# Patient Record
Sex: Male | Born: 1955 | Race: White | Hispanic: No | Marital: Married | Smoking: Current every day smoker
Health system: Southern US, Community
[De-identification: ages and names within clinical notes are randomized; demographics above are authoritative.]

## PROBLEM LIST (undated history)

## (undated) DIAGNOSIS — T8859XA Other complications of anesthesia, initial encounter: Secondary | ICD-10-CM

## (undated) DIAGNOSIS — F419 Anxiety disorder, unspecified: Secondary | ICD-10-CM

## (undated) DIAGNOSIS — M1990 Unspecified osteoarthritis, unspecified site: Secondary | ICD-10-CM

## (undated) DIAGNOSIS — I10 Essential (primary) hypertension: Secondary | ICD-10-CM

## (undated) DIAGNOSIS — I2510 Atherosclerotic heart disease of native coronary artery without angina pectoris: Secondary | ICD-10-CM

## (undated) DIAGNOSIS — I219 Acute myocardial infarction, unspecified: Secondary | ICD-10-CM

## (undated) DIAGNOSIS — Z951 Presence of aortocoronary bypass graft: Secondary | ICD-10-CM

## (undated) DIAGNOSIS — T4145XA Adverse effect of unspecified anesthetic, initial encounter: Secondary | ICD-10-CM

## (undated) DIAGNOSIS — E785 Hyperlipidemia, unspecified: Secondary | ICD-10-CM

## (undated) HISTORY — DX: Hyperlipidemia, unspecified: E78.5

## (undated) HISTORY — DX: Essential (primary) hypertension: I10

---

## 1999-01-03 ENCOUNTER — Emergency Department (HOSPITAL_COMMUNITY): Admission: EM | Admit: 1999-01-03 | Discharge: 1999-01-03 | Payer: Self-pay

## 2009-12-23 ENCOUNTER — Emergency Department (HOSPITAL_COMMUNITY): Admission: EM | Admit: 2009-12-23 | Discharge: 2009-12-23 | Payer: Self-pay | Admitting: Emergency Medicine

## 2010-01-01 ENCOUNTER — Emergency Department (HOSPITAL_COMMUNITY): Admission: EM | Admit: 2010-01-01 | Discharge: 2010-01-01 | Payer: Self-pay | Admitting: Family Medicine

## 2012-12-31 ENCOUNTER — Inpatient Hospital Stay (HOSPITAL_COMMUNITY)
Admission: EM | Admit: 2012-12-31 | Discharge: 2013-01-08 | DRG: 234 | Disposition: A | Discharge status: Home / Self Care | Payer: Medicaid Other | Attending: Thoracic Surgery (Cardiothoracic Vascular Surgery) | Admitting: Thoracic Surgery (Cardiothoracic Vascular Surgery)

## 2012-12-31 ENCOUNTER — Emergency Department (HOSPITAL_COMMUNITY): Payer: Medicaid Other

## 2012-12-31 ENCOUNTER — Encounter (HOSPITAL_COMMUNITY): Payer: Self-pay | Admitting: Cardiology

## 2012-12-31 DIAGNOSIS — I219 Acute myocardial infarction, unspecified: Secondary | ICD-10-CM

## 2012-12-31 DIAGNOSIS — J988 Other specified respiratory disorders: Secondary | ICD-10-CM | POA: Diagnosis not present

## 2012-12-31 DIAGNOSIS — R05 Cough: Secondary | ICD-10-CM | POA: Diagnosis present

## 2012-12-31 DIAGNOSIS — E7800 Pure hypercholesterolemia, unspecified: Secondary | ICD-10-CM | POA: Diagnosis present

## 2012-12-31 DIAGNOSIS — Z23 Encounter for immunization: Secondary | ICD-10-CM

## 2012-12-31 DIAGNOSIS — I498 Other specified cardiac arrhythmias: Secondary | ICD-10-CM | POA: Diagnosis present

## 2012-12-31 DIAGNOSIS — E8779 Other fluid overload: Secondary | ICD-10-CM | POA: Diagnosis not present

## 2012-12-31 DIAGNOSIS — R079 Chest pain, unspecified: Secondary | ICD-10-CM

## 2012-12-31 DIAGNOSIS — Y832 Surgical operation with anastomosis, bypass or graft as the cause of abnormal reaction of the patient, or of later complication, without mention of misadventure at the time of the procedure: Secondary | ICD-10-CM | POA: Diagnosis not present

## 2012-12-31 DIAGNOSIS — I214 Non-ST elevation (NSTEMI) myocardial infarction: Secondary | ICD-10-CM | POA: Diagnosis present

## 2012-12-31 DIAGNOSIS — F17200 Nicotine dependence, unspecified, uncomplicated: Secondary | ICD-10-CM | POA: Diagnosis present

## 2012-12-31 DIAGNOSIS — J9819 Other pulmonary collapse: Secondary | ICD-10-CM | POA: Diagnosis not present

## 2012-12-31 DIAGNOSIS — R059 Cough, unspecified: Secondary | ICD-10-CM | POA: Diagnosis present

## 2012-12-31 DIAGNOSIS — F411 Generalized anxiety disorder: Secondary | ICD-10-CM | POA: Diagnosis present

## 2012-12-31 DIAGNOSIS — Z951 Presence of aortocoronary bypass graft: Secondary | ICD-10-CM

## 2012-12-31 DIAGNOSIS — I2510 Atherosclerotic heart disease of native coronary artery without angina pectoris: Secondary | ICD-10-CM

## 2012-12-31 HISTORY — DX: Acute myocardial infarction, unspecified: I21.9

## 2012-12-31 HISTORY — DX: Presence of aortocoronary bypass graft: Z95.1

## 2012-12-31 HISTORY — PX: CARDIAC CATHETERIZATION: SHX172

## 2012-12-31 LAB — TROPONIN I: Troponin I: 1.54 ng/mL (ref <0.30)

## 2012-12-31 LAB — CBC
MCH: 33.8 pg (ref 26.0–34.0)
MCHC: 35.2 g/dL (ref 30.0–36.0)
MCV: 96 fL (ref 78.0–100.0)
Platelets: 251 10*3/uL (ref 150–400)
RBC: 5.21 MIL/uL (ref 4.22–5.81)

## 2012-12-31 LAB — BASIC METABOLIC PANEL
CO2: 24 mEq/L (ref 19–32)
Calcium: 9.6 mg/dL (ref 8.4–10.5)
Creatinine, Ser: 0.74 mg/dL (ref 0.50–1.35)
GFR calc non Af Amer: >90 mL/min (ref >90)
Sodium: 139 mEq/L (ref 135–145)

## 2012-12-31 LAB — POCT I-STAT TROPONIN I: Troponin i, poc: 0.33 ng/mL (ref 0.00–0.08)

## 2012-12-31 MED ORDER — DIAZEPAM 5 MG PO TABS: 5.0000 mg | ORAL_TABLET | ORAL | Status: DC

## 2012-12-31 MED ORDER — ASPIRIN 81 MG PO CHEW: 324.0000 mg | CHEWABLE_TABLET | ORAL | Status: DC

## 2012-12-31 MED ORDER — ZOLPIDEM TARTRATE 5 MG PO TABS
10.0000 mg | ORAL_TABLET | Freq: Every evening | ORAL | Status: DC | PRN
  Filled 2012-12-31: qty 2

## 2012-12-31 MED ORDER — DIAZEPAM 5 MG PO TABS
5.0000 mg | ORAL_TABLET | Freq: Once | ORAL | Status: AC
  Administered 2013-01-01: 5 mg via ORAL
  Filled 2012-12-31: qty 1

## 2012-12-31 MED ORDER — HEPARIN BOLUS VIA INFUSION
4000.0000 [IU] | Freq: Once | INTRAVENOUS | Status: AC
  Administered 2012-12-31: 4000 [IU] via INTRAVENOUS

## 2012-12-31 MED ORDER — ONDANSETRON HCL 4 MG/2ML IJ SOLN: 4.0000 mg | Freq: Four times a day (QID) | INTRAMUSCULAR | Status: DC | PRN

## 2012-12-31 MED ORDER — ASPIRIN 81 MG PO CHEW
324.0000 mg | CHEWABLE_TABLET | Freq: Once | ORAL | Status: AC
  Administered 2012-12-31: 324 mg via ORAL
  Filled 2012-12-31: qty 4

## 2012-12-31 MED ORDER — NITROGLYCERIN IN D5W 200-5 MCG/ML-% IV SOLN
5.0000 ug/min | INTRAVENOUS | Status: DC
  Administered 2012-12-31 – 2013-01-01 (×2): 5 ug/min via INTRAVENOUS
  Filled 2012-12-31: qty 250

## 2012-12-31 MED ORDER — ASPIRIN EC 81 MG PO TBEC
81.0000 mg | DELAYED_RELEASE_TABLET | Freq: Every day | ORAL | Status: DC
  Administered 2013-01-01 – 2013-01-02 (×2): 81 mg via ORAL
  Filled 2012-12-31 (×4): qty 1

## 2012-12-31 MED ORDER — ALPRAZOLAM 0.25 MG PO TABS
1.0000 mg | ORAL_TABLET | Freq: Three times a day (TID) | ORAL | Status: DC | PRN
  Administered 2012-12-31 – 2013-01-02 (×3): 1 mg via ORAL
  Filled 2012-12-31: qty 4
  Filled 2012-12-31: qty 2
  Filled 2012-12-31: qty 1
  Filled 2012-12-31: qty 3
  Filled 2012-12-31: qty 4

## 2012-12-31 MED ORDER — NITROGLYCERIN 0.4 MG SL SUBL: 0.4000 mg | SUBLINGUAL_TABLET | SUBLINGUAL | Status: DC | PRN

## 2012-12-31 MED ORDER — METOPROLOL TARTRATE 25 MG PO TABS
25.0000 mg | ORAL_TABLET | Freq: Two times a day (BID) | ORAL | Status: DC
  Administered 2012-12-31 – 2013-01-02 (×5): 25 mg via ORAL
  Filled 2012-12-31 (×8): qty 1

## 2012-12-31 MED ORDER — ASPIRIN 300 MG RE SUPP
300.0000 mg | RECTAL | Status: DC
  Filled 2012-12-31: qty 1

## 2012-12-31 MED ORDER — SODIUM CHLORIDE 0.9 % IV SOLN
INTRAVENOUS | Status: DC
  Administered 2012-12-31: 23:00:00 via INTRAVENOUS

## 2012-12-31 MED ORDER — SIMVASTATIN 20 MG PO TABS
20.0000 mg | ORAL_TABLET | Freq: Every day | ORAL | Status: DC
  Administered 2013-01-01 – 2013-01-04 (×3): 20 mg via ORAL
  Filled 2012-12-31 (×6): qty 1

## 2012-12-31 MED ORDER — PANTOPRAZOLE SODIUM 40 MG PO TBEC
40.0000 mg | DELAYED_RELEASE_TABLET | Freq: Every day | ORAL | Status: DC
  Administered 2012-12-31 – 2013-01-03 (×3): 40 mg via ORAL
  Filled 2012-12-31 (×4): qty 1

## 2012-12-31 MED ORDER — INFLUENZA VIRUS VACC SPLIT PF IM SUSP
0.5000 mL | INTRAMUSCULAR | Status: AC
  Administered 2013-01-02: 0.5 mL via INTRAMUSCULAR
  Filled 2012-12-31: qty 0.5

## 2012-12-31 MED ORDER — ACETAMINOPHEN 325 MG PO TABS: 650.0000 mg | ORAL_TABLET | ORAL | Status: DC | PRN

## 2012-12-31 MED ORDER — ALPRAZOLAM 0.5 MG PO TABS
ORAL_TABLET | ORAL | Status: AC
  Administered 2012-12-31: 0.5 mg
  Filled 2012-12-31: qty 1

## 2012-12-31 MED ORDER — HEPARIN (PORCINE) IN NACL 100-0.45 UNIT/ML-% IJ SOLN
1800.0000 [IU]/h | INTRAMUSCULAR | Status: DC
  Administered 2012-12-31: 1450 [IU]/h via INTRAVENOUS
  Filled 2012-12-31 (×2): qty 250

## 2012-12-31 NOTE — ED Notes (Signed)
Pt reports about a week ago he had an episode of midsternal chest pain and has had two episodes since then. Reports his chest feels tight, and some SOB. Denies any n/v. Also reports increased BP over past couple of days.

## 2012-12-31 NOTE — H&P (Signed)
Patient ID: Juan Gates MRN: [id number], DOB/AGE: October 07, 1956   Admit date: 12/31/2012   Primary Physician: No primary provider on file. Primary Cardiologist: Dr Tresa Endo- new  HPI: 57 y/o with no prior history of CAD or serious medical issues, presents to the ER today with complaints of SSCP described as "tightness". He admits to similar episodes this past week that lasted only seconds. This am his symptoms persisted and he had radiation down his arms. In the ER his POC .33.    Problem List: History reviewed. No pertinent past medical history.  History reviewed. No pertinent past surgical history.   Allergies: No Known Allergies   Home Medications NONE   Family History  Problem Relation Age of Onset  . Hypertension Mother   . Cancer Father     bone cancer  . Cancer Brother     metastatic melanoma     History   Social History  . Marital Status: Divorced    Spouse Name: N/A    Number of Children: N/A  . Years of Education: N/A   Occupational History  . Not on file.   Social History Main Topics  . Smoking status: Current Every Day Smoker -- 1.5 packs/day    Types: Cigarettes  . Smokeless tobacco: Not on file  . Alcohol Use: Yes  . Drug Use: No  . Sexually Active: Not on file   Other Topics Concern  . Not on file   Social History Narrative  . No narrative on file     Review of Systems: General: negative for chills, fever, night sweats or weight changes.  Cardiovascular: negative for chest pain, dyspnea on exertion, edema, orthopnea, palpitations, paroxysmal nocturnal dyspnea or shortness of breath Dermatological: negative for rash Respiratory: negative for cough or wheezing Urologic: negative for hematuria Abdominal: negative for nausea, vomiting, diarrhea, bright red blood per rectum, melena, or hematemesis Neurologic: negative for visual changes, syncope, or dizziness All other systems reviewed and are otherwise negative except as noted  above.  Physical Exam: Blood pressure 135/92, pulse 87, temperature 97.7 F (36.5 C), temperature source Oral, resp. rate 13, SpO2 94.00%.  General appearance: alert, cooperative and no distress Neck: no carotid bruit and no JVD Lungs: clear to auscultation bilaterally Heart: regular rate and rhythm, S1, S2 normal, no murmur, click, rub or gallop Abdomen: soft, non-tender; bowel sounds normal; no masses,  no organomegaly Extremities: extremities normal, atraumatic, no cyanosis or edema Pulses: 2+ and symmetric Skin: Skin color, texture, turgor normal. No rashes or lesions Neurologic: Grossly normal    Labs:   Results for orders placed during the hospital encounter of 12/31/12 (from the past 24 hour(s))  CBC     Status: Abnormal   Collection Time   12/31/12  1:53 PM      Component Value Range   WBC 9.9  4.0 - 10.5 K/uL   RBC 5.21  4.22 - 5.81 MIL/uL   Hemoglobin 17.6 (*) 13.0 - 17.0 g/dL   HCT 13.0  86.5 - 78.4 %   MCV 96.0  78.0 - 100.0 fL   MCH 33.8  26.0 - 34.0 pg   MCHC 35.2  30.0 - 36.0 g/dL   RDW 69.6  29.5 - 28.4 %   Platelets 251  150 - 400 K/uL  BASIC METABOLIC PANEL     Status: Abnormal   Collection Time   12/31/12  1:53 PM      Component Value Range   Sodium 139  135 - 145 mEq/L  Potassium 3.5  3.5 - 5.1 mEq/L   Chloride 101  96 - 112 mEq/L   CO2 24  19 - 32 mEq/L   Glucose, Bld 125 (*) 70 - 99 mg/dL   BUN 11  6 - 23 mg/dL   Creatinine, Ser 2.13  0.50 - 1.35 mg/dL   Calcium 9.6  8.4 - 08.6 mg/dL   GFR calc non Af Amer >90  >90 mL/min   GFR calc Af Amer >90  >90 mL/min  POCT I-STAT TROPONIN I     Status: Abnormal   Collection Time   12/31/12  2:02 PM      Component Value Range   Troponin i, poc 0.33 (*) 0.00 - 0.08 ng/mL   Comment NOTIFIED PHYSICIAN     Comment 3              Radiology/Studies: Dg Chest 2 View  12/31/2012  *RADIOLOGY REPORT*  Clinical Data: Chest pain  CHEST - 2 VIEW  Comparison: None  Findings: Cardiomediastinal silhouette is unremarkable.   No acute infiltrate or pleural effusion.  No pulmonary edema.  Mild degenerative changes mid thoracic spine.  IMPRESSION: No active disease.  Mild degenerative changes mid thoracic spine.   Original Report Authenticated By: Natasha Mead, M.D.     EKG:NSR, NSST changes  ASSESSMENT AND PLAN:  Active Problems:  NSTEMI (non-ST elevated myocardial infarction)  Smoker  Plan- Admit, Heparin, ASA, statin, beta blocker, NTG, cycle Troponin, cath in AM.  Signed, Juan Derrick, PA-C 12/31/2012, 4:48 PM   Patient seen and examined. Agree with assessment and plan. 35 yr old WM with a 30 year h/o tobacco use presents today with recurrent episodes of chest tightness worrisome for angina. Today's episode was non-exertional but associated with arm radiation. He denies any wheezing. Pain resolved spontaneously but due to longer duration of discomfort he presented to ER for evaluation.  ECG shows mild nondiagnostic ST changes inferolaterally and initial POC marker is mildly positive for troponin.  Will treat as possible USAP, and will heparinize, initiate beta-blocker, nitrates and cycle enzymes. With risk factor profile will most likely need definitive cardiac catheterization. Will tentatively set up for in am.   Juan Bihari, MD, [facility] 12/31/2012 5:06 PM

## 2012-12-31 NOTE — Progress Notes (Signed)
ANTICOAGULATION CONSULT NOTE - Initial Consult  Pharmacy Consult for Heparin Indication: chest pain/ACS  No Known Allergies  Patient Measurements: Height: 6' 1.5" (186.7 cm) Weight: 230 lb (104.327 kg) IBW/kg (Calculated) : 81.05  Heparin Dosing Weight: 102 Kg  Vital Signs: Temp: 97.7 F (36.5 C) (01/06 1343) Temp src: Oral (01/06 1343) BP: 135/92 mmHg (01/06 1545) Pulse Rate: 87  (01/06 1545)  Labs:  Basename 12/31/12 1353  HGB 17.6*  HCT 50.0  PLT 251  APTT --  LABPROT --  INR --  HEPARINUNFRC --  CREATININE 0.74  CKTOTAL --  CKMB --  TROPONINI --    CrCl is unknown because there is no height on file for the current visit.  Medical History: History reviewed. No pertinent past medical history.  Medications:  PTA meds: none  Assessment: Juan Gates is a 57 yo without significant PMH who is admitted with chest pain. Noted his initial troponin is elevated at 0.33, other labs are nml at baseline. Noted cath planned in AM.  Goal of Therapy:  Heparin level 0.3-0.7 units/ml Monitor platelets by anticoagulation protocol: Yes   Plan:  Give 4000 units bolus x 1 Start heparin infusion at 1450 units/hr Check anti-Xa level in 6 hours and daily while on heparin Continue to monitor H&H and platelets  Thanks, Erionna Strum K. Allena Katz, PharmD, BCPS.  Clinical Pharmacist Pager [phone]. 12/31/2012 5:59 PM

## 2012-12-31 NOTE — ED Provider Notes (Signed)
History     CSN: [id number]  Arrival date & time 12/31/12  1331   First MD Initiated Contact with Patient 12/31/12 1512      Chief Complaint  Patient presents with  . Chest Pain    HPI Pt was seen at 1520.   Per pt, c/o gradual onset and worsening of multiple intermittent episodes of chest "pain" for the past week.  Describes the CP as "heaviness" and "tightness."  States the chest discomfort began while he was walking in the woods to [place], lasted approx "10 seconds" before spontaneously resolving.  States since then, the CP has been intermittent for the past week, occurring at rest or during exertion, lasting for longer and longer periods of time.  Endorses today's episode was "much worse than the other times."  Describes today's episode of CP as "more intense," assoc with SOB and radiation down his arms, which resolved after 10 minutes.  Pt has not taken his daily ASA yet today.  Denies N/V/D, no back pain, no palpitations, no abd pain.     History reviewed. No pertinent past medical history.  History reviewed. No pertinent past surgical history.  Family History  Problem Relation Age of Onset  . Hypertension Mother   . Cancer Father     bone cancer  . Cancer Brother     metastatic melanoma    History  Substance Use Topics  . Smoking status: Current Every Day Smoker  . Smokeless tobacco: Not on file  . Alcohol Use: Yes    Review of Systems ROS: Statement: All systems negative except as marked or noted in the HPI; Constitutional: Negative for fever and chills. ; ; Eyes: Negative for eye pain, redness and discharge. ; ; ENMT: Negative for ear pain, hoarseness, nasal congestion, sinus pressure and sore throat. ; ; Cardiovascular: CP, SOB. Negative for palpitations, diaphoresis, and peripheral edema. ; ; Respiratory: Negative for cough, wheezing and stridor. ; ; Gastrointestinal: Negative for nausea, vomiting, diarrhea, abdominal pain, blood in stool, hematemesis, jaundice  and rectal bleeding. . ; ; Genitourinary: Negative for dysuria, flank pain and hematuria. ; ; Musculoskeletal: Negative for back pain and neck pain. Negative for swelling and trauma.; ; Skin: Negative for pruritus, rash, abrasions, blisters, bruising and skin lesion.; ; Neuro: Negative for headache, lightheadedness and neck stiffness. Negative for weakness, altered level of consciousness , altered mental status, extremity weakness, paresthesias, involuntary movement, seizure and syncope.       Allergies  Review of patient's allergies indicates no known allergies.  Home Medications   Current Outpatient Rx  Name  Route  Sig  Dispense  Refill  . DIPHENHYDRAMINE HCL 25 MG PO CAPS   Oral   Take 25-50 mg by mouth at bedtime.           BP 135/92  Pulse 87  Temp 97.7 F (36.5 C) (Oral)  Resp 13  SpO2 94%  Physical Exam 1525: Physical examination:  Nursing notes reviewed; Vital signs and O2 SAT reviewed;  Constitutional: Well developed, Well nourished, Well hydrated, In no acute distress; Head:  Normocephalic, atraumatic; Eyes: EOMI, PERRL, No scleral icterus; ENMT: Mouth and pharynx normal, Mucous membranes moist; Neck: Supple, Full range of motion, No lymphadenopathy; Cardiovascular: Regular rate and rhythm, No murmur, rub, or gallop; Respiratory: Breath sounds clear & equal bilaterally, No rales, rhonchi, wheezes.  Speaking full sentences with ease, Normal respiratory effort/excursion; Chest: Nontender, Movement normal; Abdomen: Soft, Nontender, Nondistended, Normal bowel sounds; Genitourinary: No CVA tenderness;  Extremities: Pulses normal, No tenderness, No edema, No calf edema or asymmetry.; Neuro: AA&Ox3, Major CN grossly intact.  Speech clear. No gross focal motor or sensory deficits in extremities.; Skin: Color normal, Warm, Dry.   ED Course  Procedures    MDM  MDM Reviewed: nursing note and vitals Interpretation: labs, ECG and x-ray    Date: 12/31/2012 on arrival   Rate:  111  Rhythm: sinus tachycardia  QRS Axis: left  Intervals: normal  ST/T Wave abnormalities: nonspecific ST/T changes, diffuse ST depressions  Conduction Disutrbances:none  Narrative Interpretation:   Old EKG Reviewed: none available    Date: 12/31/2012 repeat  Rate: 96  Rhythm: normal sinus rhythm  QRS Axis: left  Intervals: normal  ST/T Wave abnormalities: nonspecific ST/T changes, diffuse ST depressions  Conduction Disutrbances:none  Narrative Interpretation:   Old EKG Reviewed: unchanged from previous EKG today.  Results for orders placed during the hospital encounter of 12/31/12  CBC      Component Value Range   WBC 9.9  4.0 - 10.5 K/uL   RBC 5.21  4.22 - 5.81 MIL/uL   Hemoglobin 17.6 (*) 13.0 - 17.0 g/dL   HCT 19.1  47.8 - 29.5 %   MCV 96.0  78.0 - 100.0 fL   MCH 33.8  26.0 - 34.0 pg   MCHC 35.2  30.0 - 36.0 g/dL   RDW 62.1  30.8 - 65.7 %   Platelets 251  150 - 400 K/uL  BASIC METABOLIC PANEL      Component Value Range   Sodium 139  135 - 145 mEq/L   Potassium 3.5  3.5 - 5.1 mEq/L   Chloride 101  96 - 112 mEq/L   CO2 24  19 - 32 mEq/L   Glucose, Bld 125 (*) 70 - 99 mg/dL   BUN 11  6 - 23 mg/dL   Creatinine, Ser 8.46  0.50 - 1.35 mg/dL   Calcium 9.6  8.4 - 96.2 mg/dL   GFR calc non Af Amer >90  >90 mL/min   GFR calc Af Amer >90  >90 mL/min  POCT I-STAT TROPONIN I      Component Value Range   Troponin i, poc 0.33 (*) 0.00 - 0.08 ng/mL   Comment NOTIFIED PHYSICIAN     Comment 3             Dg Chest 2 View 12/31/2012  *RADIOLOGY REPORT*  Clinical Data: Chest pain  CHEST - 2 VIEW  Comparison: None  Findings: Cardiomediastinal silhouette is unremarkable.  No acute infiltrate or pleural effusion.  No pulmonary edema.  Mild degenerative changes mid thoracic spine.  IMPRESSION: No active disease.  Mild degenerative changes mid thoracic spine.   Original Report Authenticated By: Natasha Mead, M.D.      1550:  Denies CP currently. Troponin minimally elevated and EKG with  diffuse ST depressions; no ST elevations or progression of ST changes while in the ED.  Concern for unstable angina, tx ASA and heparin.  Dx and testing d/w pt.  Questions answered.  Verb understanding, agreeable to admit.  T/C to [facility], case discussed, including:  HPI, pertinent PM/SHx, VS/PE, dx testing, ED course and treatment:  Agreeable to admit, requests they will come to ED for eval.    Laray Anger, DO 01/02/13 1034

## 2012-12-31 NOTE — ED Notes (Signed)
Results of troponin given to attending EMT Darrel to give to MD

## 2013-01-01 ENCOUNTER — Encounter (HOSPITAL_COMMUNITY)
Admission: EM | Disposition: A | Discharge status: Home / Self Care | Payer: Self-pay | Source: Home / Self Care | Attending: Thoracic Surgery (Cardiothoracic Vascular Surgery)

## 2013-01-01 ENCOUNTER — Other Ambulatory Visit: Payer: Self-pay | Admitting: *Deleted

## 2013-01-01 ENCOUNTER — Inpatient Hospital Stay (HOSPITAL_COMMUNITY): Payer: Medicaid Other

## 2013-01-01 DIAGNOSIS — I2510 Atherosclerotic heart disease of native coronary artery without angina pectoris: Secondary | ICD-10-CM

## 2013-01-01 DIAGNOSIS — Z01810 Encounter for preprocedural cardiovascular examination: Secondary | ICD-10-CM

## 2013-01-01 HISTORY — PX: LEFT HEART CATHETERIZATION WITH CORONARY ANGIOGRAM: SHX5451

## 2013-01-01 LAB — CBC WITH DIFFERENTIAL/PLATELET
Basophils Absolute: 0 10*3/uL (ref 0.0–0.1)
Basophils Relative: 0 % (ref 0–1)
Eosinophils Absolute: 0.2 10*3/uL (ref 0.0–0.7)
Eosinophils Relative: 2 % (ref 0–5)
HCT: 43.9 % (ref 39.0–52.0)
Hemoglobin: 15 g/dL (ref 13.0–17.0)
Lymphocytes Relative: 27 % (ref 12–46)
Lymphs Abs: 2.6 10*3/uL (ref 0.7–4.0)
MCH: 32.5 pg (ref 26.0–34.0)
MCHC: 34.2 g/dL (ref 30.0–36.0)
MCV: 95.2 fL (ref 78.0–100.0)
Monocytes Absolute: 0.7 10*3/uL (ref 0.1–1.0)
Monocytes Relative: 7 % (ref 3–12)
Neutro Abs: 6.3 10*3/uL (ref 1.7–7.7)
Neutrophils Relative %: 64 % (ref 43–77)
Platelets: 237 10*3/uL (ref 150–400)
RBC: 4.61 MIL/uL (ref 4.22–5.81)
RDW: 12.8 % (ref 11.5–15.5)
WBC: 9.8 10*3/uL (ref 4.0–10.5)

## 2013-01-01 LAB — TROPONIN I: Troponin I: 2.53 ng/mL (ref <0.30)

## 2013-01-01 LAB — MAGNESIUM: Magnesium: 2.1 mg/dL (ref 1.5–2.5)

## 2013-01-01 LAB — COMPREHENSIVE METABOLIC PANEL
ALT: 25 U/L (ref 0–53)
AST: 32 U/L (ref 0–37)
Albumin: 3.6 g/dL (ref 3.5–5.2)
Alkaline Phosphatase: 62 U/L (ref 39–117)
BUN: 11 mg/dL (ref 6–23)
CO2: 23 mEq/L (ref 19–32)
Calcium: 9 mg/dL (ref 8.4–10.5)
Chloride: 103 mEq/L (ref 96–112)
Creatinine, Ser: 0.82 mg/dL (ref 0.50–1.35)
GFR calc Af Amer: >90 mL/min (ref >90)
GFR calc non Af Amer: >90 mL/min (ref >90)
Glucose, Bld: 116 mg/dL — ABNORMAL HIGH (ref 70–99)
Potassium: 3.6 mEq/L (ref 3.5–5.1)
Sodium: 137 mEq/L (ref 135–145)
Total Bilirubin: 0.4 mg/dL (ref 0.3–1.2)
Total Protein: 6.6 g/dL (ref 6.0–8.3)

## 2013-01-01 LAB — BASIC METABOLIC PANEL
GFR calc Af Amer: >90 mL/min (ref >90)
GFR calc non Af Amer: >90 mL/min (ref >90)
Potassium: 3.8 mEq/L (ref 3.5–5.1)
Sodium: 137 mEq/L (ref 135–145)

## 2013-01-01 LAB — LIPID PANEL
Cholesterol: 281 mg/dL — ABNORMAL HIGH (ref 0–200)
Total CHOL/HDL Ratio: 7.8 RATIO
Triglycerides: 230 mg/dL — ABNORMAL HIGH (ref <150)
VLDL: 46 mg/dL — ABNORMAL HIGH (ref 0–40)

## 2013-01-01 LAB — PROTIME-INR
INR: 1.02 (ref 0.00–1.49)
Prothrombin Time: 13.3 seconds (ref 11.6–15.2)

## 2013-01-01 LAB — CBC
Hemoglobin: 15.6 g/dL (ref 13.0–17.0)
Platelets: 223 10*3/uL (ref 150–400)
RBC: 4.73 MIL/uL (ref 4.22–5.81)
WBC: 9.7 10*3/uL (ref 4.0–10.5)

## 2013-01-01 LAB — TSH: TSH: 6.377 u[IU]/mL — ABNORMAL HIGH (ref 0.350–4.500)

## 2013-01-01 LAB — HEMOGLOBIN A1C
Hgb A1c MFr Bld: 5.7 % — ABNORMAL HIGH (ref <5.7)
Mean Plasma Glucose: 117 mg/dL — ABNORMAL HIGH (ref <117)

## 2013-01-01 LAB — APTT: aPTT: 48 seconds — ABNORMAL HIGH (ref 24–37)

## 2013-01-01 SURGERY — LEFT HEART CATHETERIZATION WITH CORONARY ANGIOGRAM
Anesthesia: LOCAL

## 2013-01-01 MED ORDER — HEPARIN BOLUS VIA INFUSION
2000.0000 [IU] | Freq: Once | INTRAVENOUS | Status: AC
  Administered 2013-01-01: 2000 [IU] via INTRAVENOUS
  Filled 2013-01-01: qty 2000

## 2013-01-01 MED ORDER — SODIUM CHLORIDE 0.9 % IJ SOLN
3.0000 mL | Freq: Two times a day (BID) | INTRAMUSCULAR | Status: DC
  Administered 2013-01-02 – 2013-01-03 (×2): 3 mL via INTRAVENOUS

## 2013-01-01 MED ORDER — NICOTINE 14 MG/24HR TD PT24
14.0000 mg | MEDICATED_PATCH | Freq: Every day | TRANSDERMAL | Status: DC
  Administered 2013-01-02: 10:00:00 14 mg via TRANSDERMAL
  Filled 2013-01-01 (×2): qty 1

## 2013-01-01 MED ORDER — SODIUM CHLORIDE 0.9 % IJ SOLN
3.0000 mL | INTRAMUSCULAR | Status: DC | PRN
  Administered 2013-01-01 – 2013-01-02 (×2): 3 mL via INTRAVENOUS

## 2013-01-01 MED ORDER — SODIUM CHLORIDE 0.9 % IV SOLN
1.0000 mL/kg/h | INTRAVENOUS | Status: AC
  Administered 2013-01-01: 10:00:00 1 mL/kg/h via INTRAVENOUS

## 2013-01-01 MED ORDER — FOLIC ACID 1 MG PO TABS
1.0000 mg | ORAL_TABLET | Freq: Every day | ORAL | Status: DC
  Administered 2013-01-02 – 2013-01-08 (×6): 1 mg via ORAL
  Filled 2013-01-01 (×7): qty 1

## 2013-01-01 MED ORDER — BIVALIRUDIN 250 MG IV SOLR
INTRAVENOUS | Status: AC
  Filled 2013-01-01: qty 250

## 2013-01-01 MED ORDER — MIDAZOLAM HCL 2 MG/2ML IJ SOLN
INTRAMUSCULAR | Status: AC
  Filled 2013-01-01: qty 2

## 2013-01-01 MED ORDER — NITROGLYCERIN 0.2 MG/ML ON CALL CATH LAB
INTRAVENOUS | Status: AC
  Filled 2013-01-01: qty 1

## 2013-01-01 MED ORDER — LIDOCAINE HCL (PF) 1 % IJ SOLN
INTRAMUSCULAR | Status: AC
  Filled 2013-01-01: qty 30

## 2013-01-01 MED ORDER — ADULT MULTIVITAMIN W/MINERALS CH
1.0000 | ORAL_TABLET | Freq: Every day | ORAL | Status: DC
  Administered 2013-01-02: 1 via ORAL
  Filled 2013-01-01 (×2): qty 1

## 2013-01-01 MED ORDER — VERAPAMIL HCL 2.5 MG/ML IV SOLN
INTRAVENOUS | Status: AC
  Filled 2013-01-01: qty 2

## 2013-01-01 MED ORDER — FENTANYL CITRATE 0.05 MG/ML IJ SOLN
INTRAMUSCULAR | Status: AC
  Filled 2013-01-01: qty 2

## 2013-01-01 MED ORDER — HEPARIN SOD (PORCINE) IN D5W 100 UNIT/ML IV SOLN
INTRAVENOUS | Status: AC
  Filled 2013-01-01: qty 250

## 2013-01-01 MED ORDER — VITAMIN B-1 100 MG PO TABS
100.0000 mg | ORAL_TABLET | Freq: Every day | ORAL | Status: DC
  Administered 2013-01-02 – 2013-01-08 (×6): 100 mg via ORAL
  Filled 2013-01-01 (×8): qty 1

## 2013-01-01 MED ORDER — ALBUTEROL SULFATE (5 MG/ML) 0.5% IN NEBU
2.5000 mg | INHALATION_SOLUTION | Freq: Once | RESPIRATORY_TRACT | Status: AC
  Administered 2013-01-01: 16:00:00 2.5 mg via RESPIRATORY_TRACT

## 2013-01-01 MED ORDER — LORAZEPAM 1 MG PO TABS
1.0000 mg | ORAL_TABLET | Freq: Four times a day (QID) | ORAL | Status: DC | PRN
  Filled 2013-01-01: qty 2

## 2013-01-01 MED ORDER — ~~LOC~~ CARDIAC SURGERY, PATIENT & FAMILY EDUCATION
Freq: Once | Status: AC
  Administered 2013-01-01: 19:00:00
  Filled 2013-01-01: qty 1

## 2013-01-01 MED ORDER — HEPARIN (PORCINE) IN NACL 2-0.9 UNIT/ML-% IJ SOLN
INTRAMUSCULAR | Status: AC
  Filled 2013-01-01: qty 1500

## 2013-01-01 MED ORDER — HEPARIN (PORCINE) IN NACL 100-0.45 UNIT/ML-% IJ SOLN
2050.0000 [IU]/h | INTRAMUSCULAR | Status: DC
  Administered 2013-01-01: 19:00:00 1700 [IU]/h via INTRAVENOUS
  Administered 2013-01-02 (×2): 2050 [IU]/h via INTRAVENOUS
  Filled 2013-01-01 (×3): qty 250

## 2013-01-01 MED ORDER — THIAMINE HCL 100 MG/ML IJ SOLN
100.0000 mg | Freq: Every day | INTRAMUSCULAR | Status: DC
  Filled 2013-01-01 (×3): qty 1

## 2013-01-01 MED ORDER — LORAZEPAM 2 MG/ML IJ SOLN: 1.0000 mg | Freq: Four times a day (QID) | INTRAMUSCULAR | Status: DC | PRN

## 2013-01-01 NOTE — Interval H&P Note (Signed)
History and Physical Interval Note:  01/01/2013 8:02 AM  Juan Gates  has presented today for surgery, with the diagnosis of NSTEMI.  He has been Angina free since arriving in the ER & slept well o/n. He has been referred for LHC/Angio +/- PCI for NSTEMI.  The various methods of treatment have been discussed with the patient and family. After consideration of risks, benefits and other options for treatment, the patient has consented to  Procedure(s) (LRB) with comments: LEFT HEART CATHETERIZATION WITH CORONARY ANGIOGRAM (N/A) as a surgical intervention .    The patient's history has been reviewed, patient examined, no change in status, stable for surgery.  I have reviewed the patient's chart and labs.  Questions were answered to the patient's satisfaction.    Procedure:  Left Heart Catheterization with Native Coronary Angiography +/- PCI  The procedure with Risks/Benefits/Alternatives and Indications was reviewed with the patient.  All questions were answered.    Risks / Complications include, but not limited to: Death, MI, CVA/TIA, VF/VT (with defibrillation), Bradycardia (need for temporary pacer placement), contrast induced nephropathy, bleeding / bruising / hematoma / pseudoaneurysm, vascular or coronary injury (with possible emergent CT or Vascular Surgery), adverse medication reactions, infection.    The patient voice understanding and agree to proceed.   I have signed the consent form and placed it on the chart for patient signature and RN witness.     [name], M.D., M.S. [facility] [address] [place], [place]  [address]  [phone] 01/01/2013 8:03 AM

## 2013-01-01 NOTE — Progress Notes (Signed)
ANTICOAGULATION CONSULT NOTE - Follow Up Consult  Pharmacy Consult for Heparin Indication: chest pain/ACS  No Known Allergies  Patient Measurements: Height: 6' 1.5" (186.7 cm) Weight: 230 lb (104.327 kg) IBW/kg (Calculated) : 81.05  Heparin Dosing Weight: 102 Kg  Vital Signs: Temp: 97.7 F (36.5 C) (01/07 0000) Temp src: Oral (01/07 0000) BP: 137/80 mmHg (01/07 0000) Pulse Rate: 75  (01/07 0000)  Labs:  Basename 01/01/13 0010 12/31/12 2117 12/31/12 1353  HGB 15.0 -- 17.6*  HCT 43.9 -- 50.0  PLT 237 -- 251  APTT 48* -- --  LABPROT 13.3 -- --  INR 1.02 -- --  HEPARINUNFRC 0.19* -- --  CREATININE -- -- 0.74  CKTOTAL -- -- --  CKMB -- -- --  TROPONINI -- 1.54* --    Estimated Creatinine Clearance: 135 ml/min (by C-G formula based on Cr of 0.74).  Medical History: History reviewed. No pertinent past medical history.  Medications:  PTA meds: none  Assessment: Juan Gates is a 58 yo without significant PMH who is admitted with chest pain. Noted his initial troponin is elevated at 0.33, other labs are nml at baseline. Noted cath planned in AM.  Heparin level (0.19) is below-goal on 1450 units/hr. No problems with line /infusion and no bleeding per RN.  Goal of Therapy:  Heparin level 0.3-0.7 units/ml Monitor platelets by anticoagulation protocol: Yes   Plan:  1. Heparin IV bolus of 2000 units x 1, then increase IV infusion to 1800 units/hr.  2. Heparin level in 6 hours.   01/01/2013 12:54 AM

## 2013-01-01 NOTE — Progress Notes (Signed)
PFT completed. Unconfirmed results placed in Shadow Chart. 

## 2013-01-01 NOTE — CV Procedure (Signed)
[facility] CARDIAC CATHETERIZATION REPORT  NAME:  Juan Gates   MRN: [id number] DOB:  [birth year]   ADMIT DATE: 12/31/2012 Procedure Date: 01/01/2013  INTERVENTIONAL CARDIOLOGIST: [name], M.D., MS PRIMARY CARE PROVIDER: No primary provider on file. PRIMARY CARDIOLOGIST: Lennette Bihari MD   PATIENT:  Juan Gates is a 57 y.o. male with only a long h/o smoking who presented with NSTEMI.   He is referred for LHC/Angiographyy.   PRE-OPERATIVE DIAGNOSIS:    NSTEMi  PROCEDURES PERFORMED:    Left Heart Catheterization with native Coronary Angiography ve 5 Fr R Radial Access  PROCEDURE: Consent:   Risks of procedure as well as the alternatives and risks of each were explained to the (patient/caregiver).  Consent for procedure obtained. Consent for signed by MD and patient with RN witness -- placed on chart.  PROCEDURE: The patient was brought to the 2nd Floor [facility] in the fasting state and prepped and draped in the usual sterile fashion for Right RADIAL access - after a modified Allen's test demonstrated excellent Ulnar artery flow. Sterile technique was used including antiseptics, cap, gloves, gown, hand hygiene, mask and sheet.  Skin prep: Chlorhexidine;  Time Out: Verified patient identification, verified procedure, site/side was marked, verified correct patient position, special equipment/implants available, medications/allergies/relevent history reviewed, required imaging and test results available.  Performed  Access: Right Radial Artery; 5 Fr Glide Sheath - Seldinger Technique using Angiocath Micropuncture Kit Diagnostic:  TIG 4.0 - advanced over Versicore wire to Ascending aorta;  Angled Pigtail, JR4 exchanged and removed over Long-exchange Safety J wire.  Left Coronary Artery Angiography: TIG 4.0  Right Coronary Artery Angiography: JR 4  LV Hemodynamics (LV Gram): Angled Pigtail TR Band:  0 925  Hours,  15 mL air; nonocclusive hemostasis   ANESTHESIA:   Local Lidocaine 2 ml SEDATION:  3 mg IV Versed, 50 mcg IV fentanyl ; Premedication: 5 mg oral Valium MEDICATIONS: Radial Cocktail: 5 mg Verapamil, 400 mcg NTG, 2 ml 2% Lidocaine in 10 ml NS   Anticoagulation: 4000 units IV heparin  Hemodynamics:  Central Aortic / Mean Pressures: 109/72 mmHg; 90 mmHg  Left Ventricular Pressures / EDP: 101/9 mmHg; 15 mmHg  Left Ventriculography:  EF: 55 %  Wall Motion: No regional abnormalities noted  Coronary Anatomy:  Left Main: Large-Caliber Vessel Bifurcates into LAD and Circumflex LAD: Large-caliber vessel it tapers into this long tubular 60-70% stenosis in the proximal portion of the vessel just after a very proximal diagonal branch extending into the mid vessel. In there is a short relatively normal segment where a large septal trunk and a small diagonal branch come off. Essentially bifurcating diagonal the vessel then tapers back into a long 60-70% stenosis then normalizes in the mid vessel with diffuse luminal irregularities as it courses down around the apex. There are several small diagonal branches distally.  D1:  A very proximal branch vessel ostial/proximal 70% and a smaller moderate caliber vessel. It does cover large portion of the anterolateral wall  D2:  Small-caliber very early bifurcating vessel that comes off at the same takeoff as the SP1 in the short intervening normal segment. Left Circumflex: Large-caliber vessel with a small atrial branch followed by a short hazy filling area then grossly normal area of before a focal 90% eccentric hazy lesion it is likely culprit for his non-STEMI. This is just before the vessel bifurcates into a large lateral OM and the AV groove circumflex. The follow  on the AV circumflex lysis small caliber it gives off rise to 2 very small left posterior lateral branches.  Lateral OM: Large-caliber vessel with mild luminal irregularities it gives rise  to a small OM branch followed by bifurcation into 2 larger OM 2 and 3 branches.  RCA: Large-caliber vessel, dominant with a long tubular 56% mid vessel lesion. There is RV marginal that has a 99% occlusion just before by bifurcation. This is a small-caliber vessel. The vessel bifurcates into a smaller moderate caliber PDA and posterior lateral system with 2 posterior lateral branches. There is no there's larger R. marginal branch in the more distal portion of the RCA.  PATIENT DISPOSITION:    The patient was transferred to the PACU holding area in a hemodynamicaly stable, chest pain free condition.  The patient tolerated the procedure well, and there were no complications.  The patient was stable before, during, and after the procedure.  POST-OPERATIVE DIAGNOSIS:    Severe 2 vessel coronary disease with culprit lesion being the 90% hazy stenosis in the proximal circumflex along with a long segment of 60-70% stenosis in the LAD.  Normal blurred ejection fraction with normal LVEDP.  Moderate stenosis in the RCA with the exception of a severe lesion in a small marginal branch.  PLAN OF CARE:  Due to the long diffusely diseased portion of the proximal/ mid LAD stenoses, I feel that CT Surgery consultation for possible CABG is warranted as the LAD lesion would be at least 40 mm of stenting involving potentially 2 diagonal branches and septal trunk.  The circumflex lesion is definitely PCI amenable. If he does not agreed to CABG we could consider PCI of the proximal circumflex was FFR of the LAD and only and treat medically if not significant.  Dual a 6500 step down unit for post radial cath care. I will continue that was and drip and restart heparin post procedure.   [name], M.D., M.S. [facility] [address] [place], [place]  [address]  [phone]  01/01/2013 9:38 AM

## 2013-01-01 NOTE — Progress Notes (Signed)
VASCULAR LAB PRELIMINARY  PRELIMINARY  PRELIMINARY  PRELIMINARY  Pre-op Cardiac Surgery  Carotid Findings:  Bilateral:  No evidence of hemodynamically significant internal carotid artery stenosis.   Vertebral artery flow is antegrade.      Upper Extremity Right Left  Brachial Pressures    Radial Waveforms    Ulnar Waveforms    Palmar Arch (Allen's Test)     Findings:      Lower  Extremity Right Left  Dorsalis Pedis    Anterior Tibial    Posterior Tibial    Ankle/Brachial Indices      Findings:     Juan Gates, RVT 01/01/2013, 2:30 PM

## 2013-01-01 NOTE — Progress Notes (Signed)
Patient picked up to go to cath lab by Clydie Braun, RN, and Dr. Herbie Baltimore.  This RN had no opportunity to review pre cath orders or completion of same.

## 2013-01-01 NOTE — Consult Note (Signed)
CARDIOTHORACIC SURGERY CONSULTATION REPORT  PCP is No primary provider on file. Referring Provider is [name], MD Primary Cardiologist is Lennette Bihari, MD   Reason for consultation:  Severe 2-vessel CAD  HPI:  Patient is a 57 year old male with no previous history of coronary artery disease and risk factors notable only for long-standing history of tobacco abuse and newly discovered hypercholesterolemia. The patient states he had been in his usual state of health but in recent months he had developed some mild exertional shortness of breath. Approximately 1-1/2 weeks ago he developed new onset substernal chest tightness that occurred while he was walking in the cold weather duck hunting. This episode was very transient and resolved spontaneously. Since then he had several additional mild episodes of exertional chest tightness. Yesterday the patient developed sudden onset of similar chest tightness which was more severe and radiated down both arms. The time he was watching television. The pain persisted prompting him to present to the emergency department where the pain resolved after a total duration of less than 20 minutes.  EKG revealed sinus rhythm with nonspecific ST changes. Cardiac enzymes were positive and the patient has ruled in for an acute non-ST segment elevation myocardial infarction. The patient has had no further recurrent episodes of chest pain since admission. Cardiac catheterization performed earlier today demonstrates severe two-vessel coronary artery disease including hazy 90% stenosis of the left circumflex and very long-segment 70% stenosis of the proximal left anterior descending coronary artery.  Left ventricular function was preserved. Cardiothoracic surgical consultation was requested.  History reviewed. No pertinent past medical history.  History reviewed. No pertinent past surgical history.  Family History  Problem Relation Age of Onset  .  Hypertension Mother   . Cancer Father     bone cancer  . Cancer Brother     metastatic melanoma    History   Social History  . Marital Status: Divorced    Spouse Name: N/A    Number of Children: N/A  . Years of Education: N/A   Occupational History  . Not on file.   Social History Main Topics  . Smoking status: Current Every Day Smoker -- 1.5 packs/day    Types: Cigarettes  . Smokeless tobacco: Not on file  . Alcohol Use: Yes  . Drug Use: No  . Sexually Active: Not on file   Other Topics Concern  . Not on file   Social History Narrative  . No narrative on file    Prior to Admission medications   Medication Sig Start Date End Date Taking? Authorizing Provider  diphenhydrAMINE (BENADRYL) 25 mg capsule Take 25-50 mg by mouth at bedtime.   Yes Historical Provider, MD    Current Facility-Administered Medications  Medication Dose Route Frequency Provider Last Rate Last Dose  . acetaminophen (TYLENOL) tablet 650 mg  650 mg Oral Q4H PRN Abelino Derrick, PA      . ALPRAZolam Prudy Feeler) tablet 1 mg  1 mg Oral TID PRN Abelino Derrick, PA   1 mg at 01/01/13 1233  . aspirin EC tablet 81 mg  81 mg Oral Daily Eda Paschal [place], [place]   81 mg at 01/01/13 1627  . going for heart surgery book   Does not apply Once [name], MD      . heparin 100 UNIT/ML infusion           . heparin ADULT infusion 100 units/mL (25000 units/250 mL)  1,700 Units/hr Intravenous Continuous Severiano Gilbert, PHARMD      . influenza  inactive virus vaccine (FLUZONE/FLUARIX) injection 0.5 mL  0.5 mL Intramuscular Tomorrow-1000 Lennette Bihari, MD      . metoprolol tartrate (LOPRESSOR) tablet 25 mg  25 mg Oral BID Eda Paschal [place], [place]   25 mg at 01/01/13 1627  . nitroGLYCERIN (NITROSTAT) SL tablet 0.4 mg  0.4 mg Sublingual Q5 Min x 3 PRN Abelino Derrick, PA      . nitroGLYCERIN 0.2 mg/mL in dextrose 5 % infusion  5 mcg/min Intravenous Titrated Abelino Derrick, PA 1.5 mL/hr at 01/01/13 1029 5 mcg/min at 01/01/13 1029  .  ondansetron (ZOFRAN) injection 4 mg  4 mg Intravenous Q6H PRN Abelino Derrick, PA      . pantoprazole (PROTONIX) EC tablet 40 mg  40 mg Oral Q0600 Eda Paschal [place], [place]   40 mg at 12/31/12 2232  . simvastatin (ZOCOR) tablet 20 mg  20 mg Oral q1800 Eda Paschal [place], [place]      . sodium chloride 0.9 % injection 3 mL  3 mL Intravenous Q12H [name], MD      . sodium chloride 0.9 % injection 3 mL  3 mL Intravenous PRN [name], MD      . zolpidem Remus Loffler) tablet 10 mg  10 mg Oral QHS PRN Abelino Derrick, PA        No Known Allergies    Review of Systems:   General:  normal appetite, normal energy, no weight gain, no weight loss, no fever  Cardiac:  + chest pain with exertion, + chest pain at rest, + mild SOB with exertion, no resting SOB, no PND, no orthopnea, no palpitations, no arrhythmia, no atrial fibrillation, no LE edema, no dizzy spells, no syncope  Respiratory:  no shortness of breath, no home oxygen, no productive cough, + chronic dry cough typically worse in the mornings, no bronchitis, no wheezing, no hemoptysis, no asthma, no pain with inspiration or cough, no sleep apnea, no CPAP at night  GI:   no difficulty swallowing, no reflux, no frequent heartburn, no hiatal hernia, no abdominal pain, no constipation, no diarrhea, no hematochezia, no hematemesis, no melena  GU:   + chronic left inguinal hernia which reportedly has remained non-reduced for several years without any h/o incarceration or bowel obstruction, no dysuria,  no frequency, no urinary tract infection, no hematuria, no enlarged prostate, no kidney stones, no kidney disease  Vascular:  no pain suggestive of claudication, no pain in feet, no leg cramps, no varicose veins, no DVT, no non-healing foot ulcer  Neuro:   no stroke, no TIA's, no seizures, no headaches, no temporary blindness one eye,  no slurred speech, no peripheral neuropathy, no chronic pain, no instability of gait, no memory/cognitive  dysfunction  Musculoskeletal: mild arthritis, no joint swelling, no myalgias, no difficulty walking, normal mobility   Skin:   no rash, no itching, no skin infections, no pressure sores or ulcerations  Psych:   no anxiety, no depression, no nervousness, no unusual recent stress  Eyes:   no blurry vision, no floaters, no recent vision changes, does not wear glasses or contacts  ENT:   no hearing loss, no loose or painful teeth, no dentures  Hematologic:  no easy bruising, no abnormal bleeding, no clotting disorder, no frequent epistaxis  Endocrine:  no diabetes, does not check CBG's at home     Physical Exam:   BP  117/57  Pulse 72  Temp 97.6 F (36.4 C) (Oral)  Resp 16  Ht 6' 1.5" (1.867 m)  Wt 104.327 kg (230 lb)  BMI 29.93 kg/m2  SpO2 95%  General:    well-appearing  HEENT:  Unremarkable   Neck:   no JVD, no bruits, no adenopathy   Chest:   clear to auscultation, symmetrical breath sounds, no wheezes, no rhonchi   CV:   RRR, no murmur   Abdomen:  soft, non-tender, no masses   Extremities:  warm, well-perfused, pulses palpable  Rectal/GU  Very large left inguinal hernia with intestines herniating into scrotal sac  Neuro:   Grossly non-focal and symmetrical throughout  Skin:   Clean and dry, no rashes, no breakdown  Diagnostic Tests:  [facility]  CARDIAC CATHETERIZATION REPORT  NAME: Juan Gates MRN: [id number]  DOB: 07-04-1956 ADMIT DATE: 12/31/2012  Procedure Date: 01/01/2013  INTERVENTIONAL CARDIOLOGIST: [name], M.D., MS  PRIMARY CARE PROVIDER: No primary provider on file.  PRIMARY CARDIOLOGIST: Lennette Bihari MD  PATIENT: Juan Gates is a 56 y.o. male with only a long h/o smoking who presented with NSTEMI.  He is referred for LHC/Angiographyy.  PRE-OPERATIVE DIAGNOSIS:  NSTEMi PROCEDURES PERFORMED:  Left Heart Catheterization with native Coronary Angiography ve 5 Fr R Radial Access PROCEDURE:  Consent: Risks of procedure  as well as the alternatives and risks of each were explained to the (patient/caregiver). Consent for procedure obtained.  Consent for signed by MD and patient with RN witness -- placed on chart.  PROCEDURE: The patient was brought to the 2nd Floor [facility] in the fasting state and prepped and draped in the usual sterile fashion for Right RADIAL access - after a modified Allen's test demonstrated excellent Ulnar artery flow. Sterile technique was used including antiseptics, cap, gloves, gown, hand hygiene, mask and sheet.  Skin prep: Chlorhexidine;  Time Out: Verified patient identification, verified procedure, site/side was marked, verified correct patient position, special equipment/implants available, medications/allergies/relevent history reviewed, required imaging and test results available. Performed  Access: Right Radial Artery; 5 Fr Glide Sheath - Seldinger Technique using Angiocath Micropuncture Kit  Diagnostic: TIG 4.0 - advanced over Versicore wire to Ascending aorta; Angled Pigtail, JR4 exchanged and removed over Long-exchange Safety J wire.  Left Coronary Artery Angiography: TIG 4.0  Right Coronary Artery Angiography: JR 4  LV Hemodynamics (LV Gram): Angled Pigtail TR Band: 0 925 Hours, 15 mL air; nonocclusive hemostasis  ANESTHESIA: Local Lidocaine 2 ml  SEDATION: 3 mg IV Versed, 50 mcg IV fentanyl ; Premedication: 5 mg oral Valium  MEDICATIONS: Radial Cocktail: 5 mg Verapamil, 400 mcg NTG, 2 ml 2% Lidocaine in 10 ml NS  Anticoagulation: 4000 units IV heparin  Hemodynamics:  Central Aortic / Mean Pressures: 109/72 mmHg; 90 mmHg  Left Ventricular Pressures / EDP: 101/9 mmHg; 15 mmHg Left Ventriculography:  EF: 55 %  Wall Motion: No regional abnormalities noted Coronary Anatomy:  Left Main: Large-Caliber Vessel Bifurcates into LAD and Circumflex LAD: Large-caliber vessel it tapers into this long tubular 60-70% stenosis in the proximal portion of the  vessel just after a very proximal diagonal branch extending into the mid vessel. In there is a short relatively normal segment where a large septal trunk and a small diagonal branch come off. Essentially bifurcating diagonal the vessel then tapers back into a long 60-70% stenosis then normalizes in the mid vessel with diffuse luminal irregularities as it courses down around the  apex. There are several small diagonal branches distally.  D1: A very proximal branch vessel ostial/proximal 70% and a smaller moderate caliber vessel. It does cover large portion of the anterolateral wall  D2: Small-caliber very early bifurcating vessel that comes off at the same takeoff as the SP1 in the short intervening normal segment. Left Circumflex: Large-caliber vessel with a small atrial branch followed by a short hazy filling area then grossly normal area of before a focal 90% eccentric hazy lesion it is likely culprit for his non-STEMI. This is just before the vessel bifurcates into a large lateral OM and the AV groove circumflex. The follow on the AV circumflex lysis small caliber it gives off rise to 2 very small left posterior lateral branches.  Lateral OM: Large-caliber vessel with mild luminal irregularities it gives rise to a small OM branch followed by bifurcation into 2 larger OM 2 and 3 branches. RCA: Large-caliber vessel, dominant with a long tubular 56% mid vessel lesion. There is RV marginal that has a 99% occlusion just before by bifurcation. This is a small-caliber vessel. The vessel bifurcates into a smaller moderate caliber PDA and posterior lateral system with 2 posterior lateral branches. There is no there's larger R. marginal branch in the more distal portion of the RCA. PATIENT DISPOSITION:  The patient was transferred to the PACU holding area in a hemodynamicaly stable, chest pain free condition.  The patient tolerated the procedure well, and there were no complications.  The patient was stable before,  during, and after the procedure. POST-OPERATIVE DIAGNOSIS:  Severe 2 vessel coronary disease with culprit lesion being the 90% hazy stenosis in the proximal circumflex along with a long segment of 60-70% stenosis in the LAD.  Normal blurred ejection fraction with normal LVEDP.  Moderate stenosis in the RCA with the exception of a severe lesion in a small marginal branch. PLAN OF CARE:  Due to the long diffusely diseased portion of the proximal/ mid LAD stenoses, I feel that CT Surgery consultation for possible CABG is warranted as the LAD lesion would be at least 40 mm of stenting involving potentially 2 diagonal branches and septal trunk.  The circumflex lesion is definitely PCI amenable. If he does not agreed to CABG we could consider PCI of the proximal circumflex was FFR of the LAD and only and treat medically if not significant.  Dual a 6500 step down unit for post radial cath care. I will continue that was and drip and restart heparin post procedure. [name], M.D., M.S.  [facility]  [address]  [place], [place] [address]  [phone]  01/01/2013  9:38 AM       Impression:  Patient has severe two-vessel coronary artery disease s/p acute non-ST segment elevation myocardial infarction and preserved LV systolic function.  Coronary anatomy appears relatively unfavorable for percutaneous coronary intervention.  I agree that he would probably best be treated with surgical revascularization. 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 There is no history of incarceration or bowel obstruction, but this hernia cannot be reduced and could potentially at the patient at risk for incarceration or obstruction. The patient also has long-standing history of tobacco abuse and recently discovered hypercholesterolemia.   Plan:  I have outlined the indications, risks, and  potential benefits of coronary artery bypass grafting with the patient and his sister this evening. Alternative treatment strategies been discussed in detail.  The patient understands and accepts all potential associated risks of surgery including but not limited to risk of death, stroke, myocardial infarction, congestive heart failure, respiratory failure, renal failure, bleeding requiring blood transfusion and/or reexploration, arrhythmia, heart block or bradycardia requiring permanent pacemaker, pneumonia, pleural effusion, wound infection, pulmonary embolus or other thromboembolic complication, chronic pain or other delayed complications including the late recurrence of symptomatic coronary artery disease.  Concerns regarding the patient's chronic inguinal hernia were discussed. The need for smoking cessation was emphasized.  All questions answered.  We tentatively plan to proceed with surgery first case Thursday, January 9.     Salvatore Decent. Cornelius Moras, MD 01/01/2013 6:29 PM

## 2013-01-01 NOTE — Progress Notes (Signed)
CRITICAL VALUE ALERT  Critical value received:  Troponin 1.54  Date of notification:  12/31/2012  Time of notification:  2200  Critical value read back:Yes  Nurse who received alert:  [place] Brooklen Runquist RN  MD notified (1st page):  PA Corine Shelter  Time of first page:  2210  Responding MD:  PA Corine Shelter  Time MD responded:  2215

## 2013-01-01 NOTE — Progress Notes (Signed)
TR Band Removal  Deflated per Protocol: Yes Time Band Off/Dressing Applied: 12noon Reverse Allen's Test: Positive Comments: Tolerated procedure well.

## 2013-01-01 NOTE — Progress Notes (Signed)
Utilization Review Completed.   Meilyn Heindl, RN, BSN Nurse Case Manager  [phone]  

## 2013-01-02 DIAGNOSIS — E7800 Pure hypercholesterolemia, unspecified: Secondary | ICD-10-CM | POA: Diagnosis present

## 2013-01-02 DIAGNOSIS — I2510 Atherosclerotic heart disease of native coronary artery without angina pectoris: Secondary | ICD-10-CM

## 2013-01-02 DIAGNOSIS — Z01810 Encounter for preprocedural cardiovascular examination: Secondary | ICD-10-CM

## 2013-01-02 LAB — URINALYSIS, ROUTINE W REFLEX MICROSCOPIC
Bilirubin Urine: NEGATIVE
Ketones, ur: NEGATIVE mg/dL
Leukocytes, UA: NEGATIVE
Nitrite: NEGATIVE
Protein, ur: NEGATIVE mg/dL
pH: 6 (ref 5.0–8.0)

## 2013-01-02 LAB — HEMOGLOBIN A1C: Mean Plasma Glucose: 111 mg/dL (ref <117)

## 2013-01-02 LAB — BLOOD GAS, ARTERIAL
Acid-base deficit: 0.7 mmol/L (ref 0.0–2.0)
Drawn by: 331001
O2 Saturation: 97.7 %
Patient temperature: 98.6
pO2, Arterial: 89.9 mmHg (ref 80.0–100.0)

## 2013-01-02 LAB — CBC
HCT: 47.6 % (ref 39.0–52.0)
Hemoglobin: 15.9 g/dL (ref 13.0–17.0)
MCH: 32.3 pg (ref 26.0–34.0)
MCHC: 33.4 g/dL (ref 30.0–36.0)
MCV: 96.6 fL (ref 78.0–100.0)
RBC: 4.93 MIL/uL (ref 4.22–5.81)

## 2013-01-02 LAB — COMPREHENSIVE METABOLIC PANEL
AST: 23 U/L (ref 0–37)
BUN: 11 mg/dL (ref 6–23)
CO2: 21 mEq/L (ref 19–32)
Calcium: 9.1 mg/dL (ref 8.4–10.5)
Chloride: 103 mEq/L (ref 96–112)
Creatinine, Ser: 0.69 mg/dL (ref 0.50–1.35)
GFR calc Af Amer: >90 mL/min (ref >90)
GFR calc non Af Amer: >90 mL/min (ref >90)
Glucose, Bld: 115 mg/dL — ABNORMAL HIGH (ref 70–99)
Total Bilirubin: 0.6 mg/dL (ref 0.3–1.2)

## 2013-01-02 LAB — TYPE AND SCREEN: Antibody Screen: NEGATIVE

## 2013-01-02 LAB — HEPARIN LEVEL (UNFRACTIONATED): Heparin Unfractionated: 0.17 IU/mL — ABNORMAL LOW (ref 0.30–0.70)

## 2013-01-02 MED ORDER — NITROGLYCERIN IN D5W 200-5 MCG/ML-% IV SOLN
2.0000 ug/min | INTRAVENOUS | Status: DC
  Filled 2013-01-02: qty 250

## 2013-01-02 MED ORDER — CHLORHEXIDINE GLUCONATE 4 % EX LIQD
60.0000 mL | Freq: Once | CUTANEOUS | Status: AC
  Administered 2013-01-03: 4 via TOPICAL
  Filled 2013-01-02: qty 60
  Filled 2013-01-02: qty 15

## 2013-01-02 MED ORDER — SODIUM CHLORIDE 0.9 % IV SOLN
INTRAVENOUS | Status: DC
  Filled 2013-01-02: qty 40

## 2013-01-02 MED ORDER — SODIUM CHLORIDE 0.9 % IV SOLN
INTRAVENOUS | Status: AC
  Administered 2013-01-03: 1.5 [IU]/h via INTRAVENOUS
  Filled 2013-01-02: qty 1

## 2013-01-02 MED ORDER — ALPRAZOLAM 0.25 MG PO TABS
0.2500 mg | ORAL_TABLET | ORAL | Status: DC | PRN
  Administered 2013-01-02 (×2): 0.25 mg via ORAL
  Administered 2013-01-03: 0.5 mg via ORAL
  Filled 2013-01-02 (×2): qty 1
  Filled 2013-01-02: qty 2

## 2013-01-02 MED ORDER — DEXTROSE 5 % IV SOLN
750.0000 mg | INTRAVENOUS | Status: DC
  Filled 2013-01-02: qty 750

## 2013-01-02 MED ORDER — DOPAMINE-DEXTROSE 3.2-5 MG/ML-% IV SOLN
2.0000 ug/kg/min | INTRAVENOUS | Status: DC
  Filled 2013-01-02: qty 250

## 2013-01-02 MED ORDER — BISACODYL 5 MG PO TBEC
5.0000 mg | DELAYED_RELEASE_TABLET | Freq: Once | ORAL | Status: AC
  Administered 2013-01-02: 21:00:00 5 mg via ORAL
  Filled 2013-01-02: qty 1

## 2013-01-02 MED ORDER — METOPROLOL TARTRATE 12.5 MG HALF TABLET
12.5000 mg | ORAL_TABLET | Freq: Once | ORAL | Status: AC
  Administered 2013-01-03: 12.5 mg via ORAL
  Filled 2013-01-02: qty 1

## 2013-01-02 MED ORDER — DEXMEDETOMIDINE HCL IN NACL 400 MCG/100ML IV SOLN
0.1000 ug/kg/h | INTRAVENOUS | Status: AC
  Administered 2013-01-03: 0.3 ug/kg/h via INTRAVENOUS
  Filled 2013-01-02: qty 100

## 2013-01-02 MED ORDER — EPINEPHRINE HCL 1 MG/ML IJ SOLN
0.5000 ug/min | INTRAVENOUS | Status: DC
  Filled 2013-01-02: qty 4

## 2013-01-02 MED ORDER — TEMAZEPAM 15 MG PO CAPS
15.0000 mg | ORAL_CAPSULE | Freq: Once | ORAL | Status: AC | PRN
  Administered 2013-01-02: 15 mg via ORAL
  Filled 2013-01-02: qty 1

## 2013-01-02 MED ORDER — PHENYLEPHRINE HCL 10 MG/ML IJ SOLN
30.0000 ug/min | INTRAVENOUS | Status: AC
  Administered 2013-01-03: 25 ug/min via INTRAVENOUS
  Filled 2013-01-02: qty 2

## 2013-01-02 MED ORDER — MAGNESIUM SULFATE 50 % IJ SOLN
40.0000 meq | INTRAMUSCULAR | Status: DC
  Filled 2013-01-02: qty 10

## 2013-01-02 MED ORDER — POTASSIUM CHLORIDE 2 MEQ/ML IV SOLN
80.0000 meq | INTRAVENOUS | Status: DC
  Filled 2013-01-02: qty 40

## 2013-01-02 MED ORDER — VANCOMYCIN HCL 10 G IV SOLR
1500.0000 mg | INTRAVENOUS | Status: AC
  Administered 2013-01-03: 1500 mg via INTRAVENOUS
  Filled 2013-01-02: qty 1500

## 2013-01-02 MED ORDER — VERAPAMIL HCL 2.5 MG/ML IV SOLN
INTRAVENOUS | Status: AC
  Administered 2013-01-03: 10:00:00
  Filled 2013-01-02: qty 2.5

## 2013-01-02 MED ORDER — DEXTROSE 5 % IV SOLN
1.5000 g | INTRAVENOUS | Status: AC
  Administered 2013-01-03: 1.5 g via INTRAVENOUS
  Administered 2013-01-03: .75 g via INTRAVENOUS
  Filled 2013-01-02: qty 1.5

## 2013-01-02 NOTE — Progress Notes (Signed)
   CARDIOTHORACIC SURGERY PROGRESS NOTE  1 Day Post-Op  S/P Procedure(s) (LRB): LEFT HEART CATHETERIZATION WITH CORONARY ANGIOGRAM (N/A)  Subjective: No chest pain.  Somewhat anxious but o/w okay  Objective: Vital signs in last 24 hours: Temp:  [97.5 F (36.4 C)-98 F (36.7 C)] 97.5 F (36.4 C) (01/08 1191) Pulse Rate:  [71-84] 78  (01/08 1655) Cardiac Rhythm:  [-] Normal sinus rhythm (01/08 0835) Resp:  [15-21] 18  (01/08 1655) BP: (108-143)/(45-88) 108/65 mmHg (01/08 1655) SpO2:  [96 %-100 %] 100 % (01/08 1655) Weight:  [103.9 kg (229 lb 0.9 oz)] 103.9 kg (229 lb 0.9 oz) (01/08 0001)  Physical Exam:  Rhythm:   sinus  Breath sounds: clear  Heart sounds:  RRR  Incisions:  n/a  Abdomen:  soft  Extremities:  warm   Intake/Output from previous day: 01/07 0701 - 01/08 0700 In: 261 [P.O.:120; I.V.:141] Out: 2325 [Urine:2325] Intake/Output this shift: Total I/O In: -  Out: 350 [Urine:350]  Lab Results:  Basename 01/02/13 0655 01/01/13 0500  WBC 9.7 9.7  HGB 15.9 15.6  HCT 47.6 45.5  PLT 252 223   BMET:  Basename 01/02/13 0655 01/01/13 0500  NA 137 137  K 3.8 3.8  CL 103 102  CO2 21 25  GLUCOSE 115* 106*  BUN 11 9  CREATININE 0.69 0.80  CALCIUM 9.1 8.9    CBG (last 3)  No results found for this basename: GLUCAP:3 in the last 72 hours PT/INR:   Basename 01/01/13 0010  LABPROT 13.3  INR 1.02    CXR:  *RADIOLOGY REPORT*  Clinical Data: Chest pain  CHEST - 2 VIEW  Comparison: None  Findings: Cardiomediastinal silhouette is unremarkable. No acute  infiltrate or pleural effusion. No pulmonary edema. Mild  degenerative changes mid thoracic spine.  IMPRESSION:  No active disease. Mild degenerative changes mid thoracic spine.  Original Report Authenticated By: Natasha Mead, M.D.   Assessment/Plan: S/P Procedure(s) (LRB): LEFT HEART CATHETERIZATION WITH CORONARY ANGIOGRAM (N/A)  I spent in excess of 20 minutes with the patient discussing plans for  surgery in the morning.  All questions addressed.  OWEN,CLARENCE H 01/02/2013 6:04 PM

## 2013-01-02 NOTE — Progress Notes (Signed)
*  PRELIMINARY RESULTS* Echocardiogram 2D Echocardiogram has been performed.  Jeryl Columbia 01/02/2013, 2:41 PM

## 2013-01-02 NOTE — Progress Notes (Signed)
Pt. Seen and examined. Agree with the NP/PA-C note as written. Stable. Plan for CABG tomorrow per Dr. Cornelius Moras.   Chrystie Nose, MD, [facility] Attending Cardiologist [facility]

## 2013-01-02 NOTE — Progress Notes (Signed)
CARDIAC REHAB PHASE I   PRE:  Rate/Rhythm: 85 SR  BP:  Supine: 129/73  Sitting:   Standing:    SaO2:   MODE:  Ambulation: 1000 ft   POST:  Rate/Rhythem: 92 SR  BP:  Supine:   Sitting: 125/72  Standing:    SaO2:  0850-1000 Pt tolerated ambulation well without c/o of cp or SOB. VS stable Completed pre op education with pt. He is anxious and voices this. Emotional support provided. Pt has watched pre op OHS video. Answered his questions.Discussed our role in his care post op.  Beatrix Fetters

## 2013-01-02 NOTE — Progress Notes (Signed)
VASCULAR LAB PRELIMINARY  PRELIMINARY  PRELIMINARY  PRELIMINARY  Pre-op Cardiac Surgery  Carotid Findings:  Bilateral:  No evidence of hemodynamically significant internal carotid artery stenosis.   Vertebral artery flow is antegrade.      Upper Extremity Right Left  Brachial Pressures 135 Triphasic 125 Triphasic  Radial Waveforms Triphasic Triphasic  Ulnar Waveforms Triphasic Triphasic  Palmar Arch (Allen's Test) Abnormal Normal   Findings:  Right Doppler waveforms diminished greater than 50% with radial compression and remained the same with ulnar compression. Left Doppler waveforms remained the same with both radial and ulnar compression.    Lower  Extremity Right Left  Dorsalis Pedis    Anterior Tibial    Posterior Tibial    Ankle/Brachial Indices      Findings:  Palpable pedal pulses bilaterally   Dwane Andres, RVS 01/02/2013, 10:27 AM

## 2013-01-02 NOTE — Progress Notes (Signed)
ANTICOAGULATION CONSULT NOTE - Follow Up Consult  Pharmacy Consult for Heparin Indication: chest pain/ACS  No Known Allergies  Patient Measurements: Height: 6' 1.5" (186.7 cm) Weight: 229 lb 0.9 oz (103.9 kg) IBW/kg (Calculated) : 81.05  Heparin Dosing Weight: 102 Kg  Vital Signs: Temp: 97.5 F (36.4 C) (01/08 0614) Temp src: Oral (01/08 0614) BP: 135/88 mmHg (01/08 0614) Pulse Rate: 77  (01/08 0614)  Labs:  Basename 01/02/13 0655 01/01/13 2313 01/01/13 0500 01/01/13 0010 12/31/12 2117  HGB 15.9 -- 15.6 -- --  HCT 47.6 -- 45.5 43.9 --  PLT 252 -- 223 237 --  APTT -- -- -- 48* --  LABPROT -- -- -- 13.3 --  INR -- -- -- 1.02 --  HEPARINUNFRC 0.35 0.17* -- 0.19* --  CREATININE 0.69 -- 0.80 0.82 --  CKTOTAL -- -- -- -- --  CKMB -- -- -- -- --  TROPONINI -- -- 2.53* -- 1.54*    Estimated Creatinine Clearance: 134.7 ml/min (by C-G formula based on Cr of 0.69).  Medical History: History reviewed. No pertinent past medical history.  Medications:  PTA meds: none  Assessment: Mr. Winthrop is a 57 yo without significant PMH who is admitted with chest pain. S/p cath yesterday, with severe 2V disease, currently remains on IV heparin, plan for CABG 1/9. Heparin level therapeutic = 0.35 on 2050 units/hr. Hgb and plts are stable, no bleeding noted per chart.  Goal of Therapy:  Heparin level 0.3-0.7 units/ml Monitor platelets by anticoagulation protocol: Yes   Plan:  - Continue heparin at current rate. - f/u after CABG tomorrow.  Bayard Hugger, PharmD, BCPS  Clinical Pharmacist  Pager: [phone]  01/02/2013 9:50 AM

## 2013-01-02 NOTE — Progress Notes (Signed)
CARDIAC REHAB PHASE I   PRE:  Rate/Rhythm: 85 SR  BP:  Supine: 129/73  Sitting:   Standing:    SaO2:   MODE:  Ambulation: 1000 ft   POST:  Rate/Rhythem: 92 SR  BP:  Supine:   Sitting: 125/72  Standing:    SaO2:  0850-1000 Pt tolerated ambulation well without c/o of cp or SOB. VS stable Completed pre op education with pt. He is anxious and voices this. Emotional support provided. Pt has watched pre op OHS video. Answered his questions.  Juan Gates

## 2013-01-02 NOTE — Progress Notes (Signed)
ANTICOAGULATION CONSULT NOTE - Follow Up Consult  Pharmacy Consult for Heparin Indication: chest pain/ACS  No Known Allergies  Patient Measurements: Height: 6' 1.5" (186.7 cm) Weight: 230 lb (104.327 kg) IBW/kg (Calculated) : 81.05  Heparin Dosing Weight: 102 Kg  Vital Signs: Temp: 97.9 F (36.6 C) (01/07 2023) Temp src: Oral (01/07 2023) BP: 143/81 mmHg (01/07 2023) Pulse Rate: 84  (01/07 2100)  Labs:  Basename 01/01/13 2313 01/01/13 0500 01/01/13 0010 12/31/12 2117 12/31/12 1353  HGB -- 15.6 15.0 -- --  HCT -- 45.5 43.9 -- 50.0  PLT -- 223 237 -- 251  APTT -- -- 48* -- --  LABPROT -- -- 13.3 -- --  INR -- -- 1.02 -- --  HEPARINUNFRC 0.17* -- 0.19* -- --  CREATININE -- 0.80 0.82 -- 0.74  CKTOTAL -- -- -- -- --  CKMB -- -- -- -- --  TROPONINI -- 2.53* -- 1.54* --    Estimated Creatinine Clearance: 135 ml/min (by C-G formula based on Cr of 0.8).  Medical History: History reviewed. No pertinent past medical history.  Medications:  PTA meds: none  Assessment: Juan Gates is a 57 yo without significant PMH who is admitted with chest pain. Heparin was restarted post-cath.  Heparin level (0.17) is below-goal on 1700 units/hr.   Goal of Therapy:  Heparin level 0.3-0.7 units/ml Monitor platelets by anticoagulation protocol: Yes   Plan:  1. Increase IV infusion to 2050 units/hr.  2. Heparin level in 6 hours.   Lorre Munroe, PharmD 01/02/2013 12:17 AM

## 2013-01-02 NOTE — Progress Notes (Signed)
[facility]  Subjective: No complaints. Denies CP.  Objective: Vital signs in last 24 hours: Temp:  [97.5 F (36.4 C)-98 F (36.7 C)] 97.5 F (36.4 C) (01/08 4540) Pulse Rate:  [69-84] 77  (01/08 0614) Resp:  [13-21] 17  (01/08 0614) BP: (102-143)/(45-109) 135/88 mmHg (01/08 0614) SpO2:  [90 %-97 %] 97 % (01/08 0614) Weight:  [103.9 kg (229 lb 0.9 oz)] 103.9 kg (229 lb 0.9 oz) (01/08 0001)    Intake/Output from previous day: 01/07 0701 - 01/08 0700 In: 261 [P.O.:120; I.V.:141] Out: 2325 [Urine:2325] Intake/Output this shift: Total I/O In: -  Out: 350 [Urine:350]  Medications Current Facility-Administered Medications  Medication Dose Route Frequency Provider Last Rate Last Dose  . acetaminophen (TYLENOL) tablet 650 mg  650 mg Oral Q4H PRN Abelino Derrick, PA      . ALPRAZolam Prudy Feeler) tablet 1 mg  1 mg Oral TID PRN Abelino Derrick, PA   1 mg at 01/01/13 1233  . aspirin EC tablet 81 mg  81 mg Oral Daily Eda Paschal [place], [place]   81 mg at 01/01/13 1627  . folic acid (FOLVITE) tablet 1 mg  1 mg Oral Daily Runell Gess, MD      . heparin ADULT infusion 100 units/mL (25000 units/250 mL)  2,050 Units/hr Intravenous Continuous Lennette Bihari, MD 20.5 mL/hr at 01/02/13 0619 2,050 Units/hr at 01/02/13 0619  . influenza  inactive virus vaccine (FLUZONE/FLUARIX) injection 0.5 mL  0.5 mL Intramuscular Tomorrow-1000 Lennette Bihari, MD      . LORazepam (ATIVAN) tablet 1 mg  1 mg Oral Q6H PRN Runell Gess, MD       Or  . LORazepam (ATIVAN) injection 1 mg  1 mg Intravenous Q6H PRN Runell Gess, MD      . metoprolol tartrate (LOPRESSOR) tablet 25 mg  25 mg Oral BID Eda Paschal [place], [place]   25 mg at 01/01/13 2100  . multivitamin with minerals tablet 1 tablet  1 tablet Oral Daily Runell Gess, MD      . nicotine (NICODERM CQ - dosed in mg/24 hours) patch 14 mg  14 mg Transdermal Daily Wilburt Finlay, PA      . nitroGLYCERIN (NITROSTAT) SL tablet 0.4 mg  0.4 mg  Sublingual Q5 Min x 3 PRN Abelino Derrick, PA      . nitroGLYCERIN 0.2 mg/mL in dextrose 5 % infusion  5 mcg/min Intravenous Titrated Abelino Derrick, PA 1.5 mL/hr at 01/01/13 2300 5 mcg/min at 01/01/13 2300  . ondansetron (ZOFRAN) injection 4 mg  4 mg Intravenous Q6H PRN Abelino Derrick, PA      . pantoprazole (PROTONIX) EC tablet 40 mg  40 mg Oral Q0600 Eda Paschal [place], [place]   40 mg at 12/31/12 2232  . simvastatin (ZOCOR) tablet 20 mg  20 mg Oral q1800 Eda Paschal [place], [place]   20 mg at 01/01/13 1831  . sodium chloride 0.9 % injection 3 mL  3 mL Intravenous Q12H [name], MD      . sodium chloride 0.9 % injection 3 mL  3 mL Intravenous PRN [name], MD   3 mL at 01/01/13 1832  . thiamine (VITAMIN B-1) tablet 100 mg  100 mg Oral Daily Runell Gess, MD       Or  . thiamine (B-1) injection 100 mg  100 mg Intravenous Daily Runell Gess, MD      . zolpidem (  AMBIEN) tablet 10 mg  10 mg Oral QHS PRN Abelino Derrick, PA        PE: General appearance: alert, cooperative and no distress Lungs: clear to auscultation bilaterally Heart: regular rate and rhythm, S1, S2 normal, no murmur, click, rub or gallop Extremities: no LEE Pulses: 2+ and symmetric Skin: warm and dry, right radial cath site looks ok Neurologic: Grossly normal  Lab Results:   Basename 01/02/13 0655 01/01/13 0500 01/01/13 0010  WBC 9.7 9.7 9.8  HGB 15.9 15.6 15.0  HCT 47.6 45.5 43.9  PLT 252 223 237   BMET  Basename 01/01/13 0500 01/01/13 0010 12/31/12 1353  NA 137 137 139  K 3.8 3.6 3.5  CL 102 103 101  CO2 25 23 24   GLUCOSE 106* 116* 125*  BUN 9 11 11   CREATININE 0.80 0.82 0.74  CALCIUM 8.9 9.0 9.6   PT/INR  Basename 01/01/13 0010  LABPROT 13.3  INR 1.02   Cholesterol  Basename 01/01/13 0500  CHOL 281*   Cardiac Enzymes Cardiac Panel (last 3 results)  Basename 01/01/13 0500 12/31/12 2117  CKTOTAL -- --  CKMB -- --  TROPONINI 2.53* 1.54*  RELINDX -- --   Assessment/Plan  Principal Problem:   *NSTEMI- S/P cath 1/7- severe 2 vessel dz; EF 55% Active Problems:  Hypercholesterolemia  Smoker  Plan: S/P NSTEMI on 1/6. Diagnostic cath on 1/7 demonstrated severe 2 vessel disease. CT consult yesterday. Pt is scheduled for CABG on 01/03/13 (tomorrow). NSR on telemetry with no red alarms. HR and BP stable. H/H WNL/stable. He is on ASA, a BB, statin, IV Heparin and NTG and a PPI. Pt encouraged to ambulate today with nurse. Will recommend dietary consult. Will continue to monitor and will follow post CABG.    LOS: 2 days    Juan Gates 01/02/2013 8:43 AM

## 2013-01-03 ENCOUNTER — Inpatient Hospital Stay (HOSPITAL_COMMUNITY): Payer: Medicaid Other

## 2013-01-03 ENCOUNTER — Encounter (HOSPITAL_COMMUNITY)
Admission: EM | Disposition: A | Discharge status: Home / Self Care | Payer: Self-pay | Source: Home / Self Care | Attending: Thoracic Surgery (Cardiothoracic Vascular Surgery)

## 2013-01-03 ENCOUNTER — Encounter (HOSPITAL_COMMUNITY): Payer: Self-pay | Admitting: Thoracic Surgery (Cardiothoracic Vascular Surgery)

## 2013-01-03 ENCOUNTER — Inpatient Hospital Stay (HOSPITAL_COMMUNITY): Payer: Medicaid Other | Admitting: Anesthesiology

## 2013-01-03 ENCOUNTER — Encounter (HOSPITAL_COMMUNITY): Payer: Self-pay | Admitting: Anesthesiology

## 2013-01-03 DIAGNOSIS — Z951 Presence of aortocoronary bypass graft: Secondary | ICD-10-CM

## 2013-01-03 DIAGNOSIS — I2510 Atherosclerotic heart disease of native coronary artery without angina pectoris: Secondary | ICD-10-CM

## 2013-01-03 HISTORY — PX: INTRAOPERATIVE TRANSESOPHAGEAL ECHOCARDIOGRAM: SHX5062

## 2013-01-03 HISTORY — PX: CORONARY ARTERY BYPASS GRAFT: SHX141

## 2013-01-03 HISTORY — DX: Presence of aortocoronary bypass graft: Z95.1

## 2013-01-03 LAB — POCT I-STAT 4, (NA,K, GLUC, HGB,HCT)
Glucose, Bld: 110 mg/dL — ABNORMAL HIGH (ref 70–99)
Glucose, Bld: 116 mg/dL — ABNORMAL HIGH (ref 70–99)
HCT: 46 % (ref 39.0–52.0)
HCT: 44 % (ref 39.0–52.0)
HCT: 45 % (ref 39.0–52.0)
Hemoglobin: 15 g/dL (ref 13.0–17.0)
Hemoglobin: 12.2 g/dL — ABNORMAL LOW (ref 13.0–17.0)
Hemoglobin: 15.3 g/dL (ref 13.0–17.0)
Potassium: 5.2 mEq/L — ABNORMAL HIGH (ref 3.5–5.1)
Sodium: 139 mEq/L (ref 135–145)
Sodium: 138 mEq/L (ref 135–145)

## 2013-01-03 LAB — APTT: aPTT: 29 seconds (ref 24–37)

## 2013-01-03 LAB — POCT I-STAT 3, ART BLOOD GAS (G3+)
Acid-base deficit: 3 mmol/L — ABNORMAL HIGH (ref 0.0–2.0)
Acid-base deficit: 3 mmol/L — ABNORMAL HIGH (ref 0.0–2.0)
Acid-base deficit: 2 mmol/L (ref 0.0–2.0)
Bicarbonate: 25.2 mEq/L — ABNORMAL HIGH (ref 20.0–24.0)
Bicarbonate: 23.1 mEq/L (ref 20.0–24.0)
Bicarbonate: 23.9 mEq/L (ref 20.0–24.0)
O2 Saturation: 100 %
O2 Saturation: 98 %
O2 Saturation: 97 %
Patient temperature: 37.2
TCO2: 27 mmol/L (ref 0–100)
TCO2: 23 mmol/L (ref 0–100)
pCO2 arterial: 47.4 mmHg — ABNORMAL HIGH (ref 35.0–45.0)
pCO2 arterial: 46.6 mmHg — ABNORMAL HIGH (ref 35.0–45.0)
pCO2 arterial: 49.6 mmHg — ABNORMAL HIGH (ref 35.0–45.0)
pCO2 arterial: 38.1 mmHg (ref 35.0–45.0)
pCO2 arterial: 41.3 mmHg (ref 35.0–45.0)
pH, Arterial: 7.29 — ABNORMAL LOW (ref 7.350–7.450)
pH, Arterial: 7.37 (ref 7.350–7.450)
pO2, Arterial: 59 mmHg — ABNORMAL LOW (ref 80.0–100.0)
pO2, Arterial: 93 mmHg (ref 80.0–100.0)
pO2, Arterial: 135 mmHg — ABNORMAL HIGH (ref 80.0–100.0)

## 2013-01-03 LAB — PROTIME-INR
INR: 1.17 (ref 0.00–1.49)
Prothrombin Time: 14.7 seconds (ref 11.6–15.2)

## 2013-01-03 LAB — CBC
MCH: 33 pg (ref 26.0–34.0)
MCH: 31.2 pg (ref 26.0–34.0)
MCHC: 34 g/dL (ref 30.0–36.0)
MCHC: 32.6 g/dL (ref 30.0–36.0)
MCV: 96 fL (ref 78.0–100.0)
Platelets: 237 10*3/uL (ref 150–400)
Platelets: 232 10*3/uL (ref 150–400)
RBC: 5 MIL/uL (ref 4.22–5.81)
RBC: 4.49 MIL/uL (ref 4.22–5.81)
RDW: 12.7 % (ref 11.5–15.5)
WBC: 21.4 10*3/uL — ABNORMAL HIGH (ref 4.0–10.5)

## 2013-01-03 LAB — POCT I-STAT, CHEM 8
BUN: 10 mg/dL (ref 6–23)
Calcium, Ion: 1.14 mmol/L (ref 1.12–1.23)
Creatinine, Ser: 0.8 mg/dL (ref 0.50–1.35)
TCO2: 23 mmol/L (ref 0–100)

## 2013-01-03 LAB — GLUCOSE, CAPILLARY
Glucose-Capillary: 110 mg/dL — ABNORMAL HIGH (ref 70–99)
Glucose-Capillary: 106 mg/dL — ABNORMAL HIGH (ref 70–99)
Glucose-Capillary: 97 mg/dL (ref 70–99)

## 2013-01-03 LAB — PLATELET COUNT: Platelets: 247 10*3/uL (ref 150–400)

## 2013-01-03 LAB — MAGNESIUM: Magnesium: 2.9 mg/dL — ABNORMAL HIGH (ref 1.5–2.5)

## 2013-01-03 LAB — HEMOGLOBIN AND HEMATOCRIT, BLOOD: Hemoglobin: 11.4 g/dL — ABNORMAL LOW (ref 13.0–17.0)

## 2013-01-03 SURGERY — CORONARY ARTERY BYPASS GRAFTING (CABG)
Anesthesia: General | Site: Chest | Wound class: Clean

## 2013-01-03 MED ORDER — SODIUM CHLORIDE 0.9 % IV SOLN: INTRAVENOUS | Status: DC

## 2013-01-03 MED ORDER — ASPIRIN 81 MG PO CHEW: 324.0000 mg | CHEWABLE_TABLET | Freq: Every day | ORAL | Status: DC

## 2013-01-03 MED ORDER — KETOROLAC TROMETHAMINE 30 MG/ML IJ SOLN
30.0000 mg | Freq: Once | INTRAMUSCULAR | Status: AC | PRN
  Administered 2013-01-03: 30 mg via INTRAVENOUS
  Filled 2013-01-03: qty 1

## 2013-01-03 MED ORDER — PHENYLEPHRINE HCL 10 MG/ML IJ SOLN
0.0000 ug/min | INTRAVENOUS | Status: DC
  Administered 2013-01-03: 35.067 ug/min via INTRAVENOUS
  Administered 2013-01-03: 53.333 ug/min via INTRAVENOUS
  Administered 2013-01-04: 66.667 ug/min via INTRAVENOUS
  Filled 2013-01-03: qty 2

## 2013-01-03 MED ORDER — INSULIN ASPART 100 UNIT/ML ~~LOC~~ SOLN
0.0000 [IU] | SUBCUTANEOUS | Status: DC
  Administered 2013-01-04: 2 [IU] via SUBCUTANEOUS

## 2013-01-03 MED ORDER — FAMOTIDINE IN NACL 20-0.9 MG/50ML-% IV SOLN: 20.0000 mg | Freq: Two times a day (BID) | INTRAVENOUS | Status: DC

## 2013-01-03 MED ORDER — SODIUM CHLORIDE 0.9 % IJ SOLN
OROMUCOSAL | Status: DC | PRN
  Administered 2013-01-03 (×3): via TOPICAL

## 2013-01-03 MED ORDER — OXYCODONE HCL 5 MG PO TABS: 5.0000 mg | ORAL_TABLET | ORAL | Status: DC | PRN

## 2013-01-03 MED ORDER — MAGNESIUM SULFATE 40 MG/ML IJ SOLN
4.0000 g | Freq: Once | INTRAMUSCULAR | Status: AC
  Administered 2013-01-03: 4 g via INTRAVENOUS
  Filled 2013-01-03: qty 100

## 2013-01-03 MED ORDER — PANTOPRAZOLE SODIUM 40 MG PO TBEC
40.0000 mg | DELAYED_RELEASE_TABLET | Freq: Every day | ORAL | Status: DC
  Administered 2013-01-05 – 2013-01-08 (×4): 40 mg via ORAL
  Filled 2013-01-03 (×4): qty 1

## 2013-01-03 MED ORDER — SUCCINYLCHOLINE CHLORIDE 20 MG/ML IJ SOLN
INTRAMUSCULAR | Status: DC | PRN
  Administered 2013-01-03: 120 mg via INTRAVENOUS

## 2013-01-03 MED ORDER — METOCLOPRAMIDE HCL 5 MG/ML IJ SOLN
10.0000 mg | Freq: Four times a day (QID) | INTRAMUSCULAR | Status: AC
  Administered 2013-01-03 – 2013-01-04 (×4): 10 mg via INTRAVENOUS
  Filled 2013-01-03 (×4): qty 2

## 2013-01-03 MED ORDER — INSULIN ASPART 100 UNIT/ML ~~LOC~~ SOLN: 0.0000 [IU] | SUBCUTANEOUS | Status: AC

## 2013-01-03 MED ORDER — ACETAMINOPHEN 10 MG/ML IV SOLN: 1000.0000 mg | Freq: Once | INTRAVENOUS | Status: DC

## 2013-01-03 MED ORDER — SODIUM CHLORIDE 0.9 % IV SOLN
10.0000 g | INTRAVENOUS | Status: DC | PRN
  Administered 2013-01-03: 5 g/h via INTRAVENOUS

## 2013-01-03 MED ORDER — SODIUM CHLORIDE 0.9 % IJ SOLN: 3.0000 mL | Freq: Two times a day (BID) | INTRAMUSCULAR | Status: DC

## 2013-01-03 MED ORDER — CALCIUM CHLORIDE 10 % IV SOLN
1.0000 g | Freq: Once | INTRAVENOUS | Status: AC | PRN
  Administered 2013-01-04: 1 g via INTRAVENOUS
  Filled 2013-01-03: qty 10

## 2013-01-03 MED ORDER — PROTAMINE SULFATE 10 MG/ML IV SOLN
INTRAVENOUS | Status: DC | PRN
  Administered 2013-01-03 (×3): 100 mg via INTRAVENOUS
  Administered 2013-01-03: 40 mg via INTRAVENOUS
  Administered 2013-01-03: 10 mg via INTRAVENOUS

## 2013-01-03 MED ORDER — MORPHINE SULFATE 2 MG/ML IJ SOLN
1.0000 mg | INTRAMUSCULAR | Status: AC | PRN
Start: 2013-01-03 — End: 2013-01-04
  Filled 2013-01-03: qty 2
  Filled 2013-01-03: qty 1

## 2013-01-03 MED ORDER — ASPIRIN EC 325 MG PO TBEC
325.0000 mg | DELAYED_RELEASE_TABLET | Freq: Every day | ORAL | Status: DC
  Administered 2013-01-04 – 2013-01-08 (×5): 325 mg via ORAL
  Filled 2013-01-03 (×5): qty 1

## 2013-01-03 MED ORDER — LACTATED RINGERS IV SOLN
INTRAVENOUS | Status: DC | PRN
  Administered 2013-01-03: 07:00:00 via INTRAVENOUS

## 2013-01-03 MED ORDER — ACETAMINOPHEN 500 MG PO TABS: 1000.0000 mg | ORAL_TABLET | Freq: Four times a day (QID) | ORAL | Status: DC

## 2013-01-03 MED ORDER — ACETAMINOPHEN 160 MG/5ML PO SOLN: 975.0000 mg | Freq: Four times a day (QID) | ORAL | Status: DC

## 2013-01-03 MED ORDER — SODIUM CHLORIDE 0.9 % IJ SOLN: 3.0000 mL | INTRAMUSCULAR | Status: DC | PRN

## 2013-01-03 MED ORDER — POTASSIUM CHLORIDE 10 MEQ/50ML IV SOLN: 10.0000 meq | INTRAVENOUS | Status: AC

## 2013-01-03 MED ORDER — BISACODYL 10 MG RE SUPP: 10.0000 mg | Freq: Every day | RECTAL | Status: DC

## 2013-01-03 MED ORDER — ALBUMIN HUMAN 5 % IV SOLN: 250.0000 mL | INTRAVENOUS | Status: DC | PRN

## 2013-01-03 MED ORDER — METOPROLOL TARTRATE 12.5 MG HALF TABLET
12.5000 mg | ORAL_TABLET | Freq: Two times a day (BID) | ORAL | Status: DC
  Filled 2013-01-03 (×3): qty 1

## 2013-01-03 MED ORDER — ALBUMIN HUMAN 5 % IV SOLN
250.0000 mL | INTRAVENOUS | Status: AC | PRN
  Administered 2013-01-03 – 2013-01-04 (×4): 250 mL via INTRAVENOUS
  Filled 2013-01-03 (×2): qty 250

## 2013-01-03 MED ORDER — NITROGLYCERIN IN D5W 200-5 MCG/ML-% IV SOLN
0.0000 ug/min | INTRAVENOUS | Status: DC
Start: 2013-01-03 — End: 2013-01-03

## 2013-01-03 MED ORDER — FENTANYL CITRATE 0.05 MG/ML IJ SOLN
INTRAMUSCULAR | Status: DC | PRN
  Administered 2013-01-03: 250 ug via INTRAVENOUS
  Administered 2013-01-03: 150 ug via INTRAVENOUS
  Administered 2013-01-03: 100 ug via INTRAVENOUS
  Administered 2013-01-03: 150 ug via INTRAVENOUS
  Administered 2013-01-03: 100 ug via INTRAVENOUS
  Administered 2013-01-03 (×2): 150 ug via INTRAVENOUS
  Administered 2013-01-03: 100 ug via INTRAVENOUS
  Administered 2013-01-03: 250 ug via INTRAVENOUS
  Administered 2013-01-03: 100 ug via INTRAVENOUS
  Administered 2013-01-03: 250 ug via INTRAVENOUS

## 2013-01-03 MED ORDER — DEXMEDETOMIDINE HCL IN NACL 200 MCG/50ML IV SOLN: 0.1000 ug/kg/h | INTRAVENOUS | Status: DC

## 2013-01-03 MED ORDER — VANCOMYCIN HCL IN DEXTROSE 1-5 GM/200ML-% IV SOLN
1000.0000 mg | Freq: Once | INTRAVENOUS | Status: DC
  Filled 2013-01-03: qty 200

## 2013-01-03 MED ORDER — BISACODYL 5 MG PO TBEC
10.0000 mg | DELAYED_RELEASE_TABLET | Freq: Every day | ORAL | Status: DC
  Administered 2013-01-04 – 2013-01-05 (×2): 10 mg via ORAL
  Filled 2013-01-03 (×4): qty 2

## 2013-01-03 MED ORDER — DOCUSATE SODIUM 100 MG PO CAPS
200.0000 mg | ORAL_CAPSULE | Freq: Every day | ORAL | Status: DC
  Administered 2013-01-04 – 2013-01-06 (×3): 200 mg via ORAL
  Filled 2013-01-03 (×4): qty 2

## 2013-01-03 MED ORDER — HEPARIN SODIUM (PORCINE) 1000 UNIT/ML IJ SOLN
INTRAMUSCULAR | Status: DC | PRN
  Administered 2013-01-03: 39000 [IU] via INTRAVENOUS

## 2013-01-03 MED ORDER — OXYCODONE HCL 5 MG PO TABS
5.0000 mg | ORAL_TABLET | ORAL | Status: DC | PRN
  Administered 2013-01-03 – 2013-01-06 (×12): 10 mg via ORAL
  Administered 2013-01-06 (×2): 5 mg via ORAL
  Administered 2013-01-07 (×3): 10 mg via ORAL
  Filled 2013-01-03 (×5): qty 2
  Filled 2013-01-03: qty 1
  Filled 2013-01-03 (×11): qty 2

## 2013-01-03 MED ORDER — DEXTROSE 5 % IV SOLN
1.5000 g | Freq: Two times a day (BID) | INTRAVENOUS | Status: DC
  Administered 2013-01-03 – 2013-01-04 (×3): 1.5 g via INTRAVENOUS
  Filled 2013-01-03 (×4): qty 1.5

## 2013-01-03 MED ORDER — DEXMEDETOMIDINE HCL IN NACL 200 MCG/50ML IV SOLN
0.1000 ug/kg/h | INTRAVENOUS | Status: DC
  Administered 2013-01-03: 0.7 ug/kg/h via INTRAVENOUS
  Filled 2013-01-03 (×2): qty 50

## 2013-01-03 MED ORDER — METOPROLOL TARTRATE 25 MG/10 ML ORAL SUSPENSION
12.5000 mg | Freq: Two times a day (BID) | ORAL | Status: DC
  Filled 2013-01-03 (×3): qty 5

## 2013-01-03 MED ORDER — LACTATED RINGERS IV SOLN: INTRAVENOUS | Status: DC

## 2013-01-03 MED ORDER — PROPOFOL 10 MG/ML IV BOLUS
INTRAVENOUS | Status: DC | PRN
  Administered 2013-01-03: 120 mg via INTRAVENOUS

## 2013-01-03 MED ORDER — ROCURONIUM BROMIDE 100 MG/10ML IV SOLN
INTRAVENOUS | Status: DC | PRN
  Administered 2013-01-03: 50 mg via INTRAVENOUS

## 2013-01-03 MED ORDER — INSULIN REGULAR BOLUS VIA INFUSION
0.0000 [IU] | Freq: Three times a day (TID) | INTRAVENOUS | Status: DC
  Filled 2013-01-03: qty 10

## 2013-01-03 MED ORDER — METOPROLOL TARTRATE 1 MG/ML IV SOLN: 2.5000 mg | INTRAVENOUS | Status: DC | PRN

## 2013-01-03 MED ORDER — MIDAZOLAM HCL 2 MG/2ML IJ SOLN
2.0000 mg | INTRAMUSCULAR | Status: DC | PRN
  Administered 2013-01-03: 2 mg via INTRAVENOUS
  Filled 2013-01-03: qty 2

## 2013-01-03 MED ORDER — PHENYLEPHRINE HCL 10 MG/ML IJ SOLN
10.0000 mg | INTRAVENOUS | Status: DC | PRN
  Administered 2013-01-03: 50 ug/min via INTRAVENOUS

## 2013-01-03 MED ORDER — NITROGLYCERIN IN D5W 200-5 MCG/ML-% IV SOLN
0.0000 ug/min | INTRAVENOUS | Status: DC
  Administered 2013-01-03: 5 ug/min via INTRAVENOUS

## 2013-01-03 MED ORDER — BISACODYL 5 MG PO TBEC: 10.0000 mg | DELAYED_RELEASE_TABLET | Freq: Every day | ORAL | Status: DC

## 2013-01-03 MED ORDER — ACETAMINOPHEN 500 MG PO TABS
1000.0000 mg | ORAL_TABLET | Freq: Four times a day (QID) | ORAL | Status: DC
  Administered 2013-01-03 – 2013-01-08 (×16): 1000 mg via ORAL
  Filled 2013-01-03 (×21): qty 2

## 2013-01-03 MED ORDER — SODIUM CHLORIDE 0.9 % IV SOLN: 250.0000 mL | INTRAVENOUS | Status: DC | PRN

## 2013-01-03 MED ORDER — MIDAZOLAM HCL 2 MG/2ML IJ SOLN: 2.0000 mg | INTRAMUSCULAR | Status: DC | PRN

## 2013-01-03 MED ORDER — SODIUM CHLORIDE 0.45 % IV SOLN
INTRAVENOUS | Status: DC
  Administered 2013-01-03: 14:00:00 via INTRAVENOUS

## 2013-01-03 MED ORDER — MORPHINE SULFATE 2 MG/ML IJ SOLN: 1.0000 mg | INTRAMUSCULAR | Status: DC | PRN

## 2013-01-03 MED ORDER — ONDANSETRON HCL 4 MG/2ML IJ SOLN: 4.0000 mg | Freq: Four times a day (QID) | INTRAMUSCULAR | Status: DC | PRN

## 2013-01-03 MED ORDER — MORPHINE SULFATE 2 MG/ML IJ SOLN
2.0000 mg | INTRAMUSCULAR | Status: DC | PRN
  Administered 2013-01-03 (×2): 2 mg via INTRAVENOUS
  Administered 2013-01-03 – 2013-01-04 (×4): 4 mg via INTRAVENOUS
  Administered 2013-01-04: 2 mg via INTRAVENOUS
  Filled 2013-01-03 (×3): qty 2
  Filled 2013-01-03 (×2): qty 1

## 2013-01-03 MED ORDER — FAMOTIDINE IN NACL 20-0.9 MG/50ML-% IV SOLN
20.0000 mg | Freq: Two times a day (BID) | INTRAVENOUS | Status: AC
  Administered 2013-01-03: 20 mg via INTRAVENOUS

## 2013-01-03 MED ORDER — MAGNESIUM SULFATE 40 MG/ML IJ SOLN: 4.0000 g | Freq: Once | INTRAMUSCULAR | Status: DC

## 2013-01-03 MED ORDER — MIDAZOLAM HCL 5 MG/5ML IJ SOLN
INTRAMUSCULAR | Status: DC | PRN
  Administered 2013-01-03 (×2): 2 mg via INTRAVENOUS
  Administered 2013-01-03 (×3): 5 mg via INTRAVENOUS
  Administered 2013-01-03: 3 mg via INTRAVENOUS

## 2013-01-03 MED ORDER — PHENYLEPHRINE HCL 10 MG/ML IJ SOLN
INTRAMUSCULAR | Status: DC | PRN
  Administered 2013-01-03 (×2): 80 ug via INTRAVENOUS

## 2013-01-03 MED ORDER — HEPARIN SODIUM (PORCINE) 1000 UNIT/ML IJ SOLN: INTRAMUSCULAR | Status: DC | PRN

## 2013-01-03 MED ORDER — LACTATED RINGERS IV SOLN: 500.0000 mL | Freq: Once | INTRAVENOUS | Status: AC | PRN

## 2013-01-03 MED ORDER — VANCOMYCIN HCL IN DEXTROSE 1-5 GM/200ML-% IV SOLN
1000.0000 mg | Freq: Once | INTRAVENOUS | Status: AC
  Administered 2013-01-04: 1000 mg via INTRAVENOUS
  Filled 2013-01-03 (×3): qty 200

## 2013-01-03 MED ORDER — DEXTROSE 5 % IV SOLN
1.5000 g | Freq: Two times a day (BID) | INTRAVENOUS | Status: DC
  Filled 2013-01-03: qty 1.5

## 2013-01-03 MED ORDER — 0.9 % SODIUM CHLORIDE (POUR BTL) OPTIME
TOPICAL | Status: DC | PRN
  Administered 2013-01-03: 6000 mL

## 2013-01-03 MED ORDER — VECURONIUM BROMIDE 10 MG IV SOLR
INTRAVENOUS | Status: DC | PRN
  Administered 2013-01-03: 1 mg via INTRAVENOUS
  Administered 2013-01-03: 4 mg via INTRAVENOUS
  Administered 2013-01-03 (×4): 5 mg via INTRAVENOUS

## 2013-01-03 MED ORDER — LACTATED RINGERS IV SOLN
INTRAVENOUS | Status: DC
  Administered 2013-01-03: 14:00:00 via INTRAVENOUS

## 2013-01-03 MED ORDER — DOCUSATE SODIUM 100 MG PO CAPS: 200.0000 mg | ORAL_CAPSULE | Freq: Every day | ORAL | Status: DC

## 2013-01-03 MED ORDER — ONDANSETRON HCL 4 MG/2ML IJ SOLN
4.0000 mg | Freq: Four times a day (QID) | INTRAMUSCULAR | Status: DC | PRN
  Administered 2013-01-04: 4 mg via INTRAVENOUS
  Filled 2013-01-03: qty 2

## 2013-01-03 MED ORDER — POTASSIUM CHLORIDE 10 MEQ/50ML IV SOLN: 10.0000 meq | INTRAVENOUS | Status: DC

## 2013-01-03 MED ORDER — MORPHINE SULFATE 2 MG/ML IJ SOLN: 2.0000 mg | INTRAMUSCULAR | Status: DC | PRN

## 2013-01-03 MED ORDER — ASPIRIN EC 325 MG PO TBEC
325.0000 mg | DELAYED_RELEASE_TABLET | Freq: Every day | ORAL | Status: DC
  Filled 2013-01-03: qty 1

## 2013-01-03 MED ORDER — PANTOPRAZOLE SODIUM 40 MG PO TBEC: 40.0000 mg | DELAYED_RELEASE_TABLET | Freq: Every day | ORAL | Status: DC

## 2013-01-03 MED ORDER — EPHEDRINE SULFATE 50 MG/ML IJ SOLN
INTRAMUSCULAR | Status: DC | PRN
  Administered 2013-01-03: 5 mg via INTRAVENOUS

## 2013-01-03 MED ORDER — LIDOCAINE HCL (CARDIAC) 20 MG/ML IV SOLN
INTRAVENOUS | Status: DC | PRN
  Administered 2013-01-03: 70 mg via INTRAVENOUS

## 2013-01-03 MED ORDER — LACTATED RINGERS IV SOLN
INTRAVENOUS | Status: DC | PRN
  Administered 2013-01-03 (×2): via INTRAVENOUS

## 2013-01-03 MED ORDER — ACETAMINOPHEN 10 MG/ML IV SOLN
1000.0000 mg | Freq: Once | INTRAVENOUS | Status: AC
  Administered 2013-01-03: 1000 mg via INTRAVENOUS
  Filled 2013-01-03: qty 100

## 2013-01-03 MED ORDER — PHENYLEPHRINE HCL 10 MG/ML IJ SOLN: 0.0000 ug/min | INTRAMUSCULAR | Status: DC

## 2013-01-03 MED ORDER — METOPROLOL TARTRATE 25 MG/10 ML ORAL SUSPENSION
12.5000 mg | Freq: Two times a day (BID) | ORAL | Status: DC
  Filled 2013-01-03: qty 5

## 2013-01-03 SURGICAL SUPPLY — 113 items
ADAPTER CARDIO PERF ANTE/RETRO (ADAPTER) IMPLANT
APPLIER CLIP 9.375 MED OPEN (MISCELLANEOUS)
APPLIER CLIP 9.375 SM OPEN (CLIP)
ATTRACTOMAT 16X20 MAGNETIC DRP (DRAPES) ×3 IMPLANT
BAG DECANTER FOR FLEXI CONT (MISCELLANEOUS) ×3 IMPLANT
BANDAGE ELASTIC 4 VELCRO ST LF (GAUZE/BANDAGES/DRESSINGS) IMPLANT
BANDAGE ELASTIC 6 VELCRO ST LF (GAUZE/BANDAGES/DRESSINGS) IMPLANT
BANDAGE GAUZE ELAST BULKY 4 IN (GAUZE/BANDAGES/DRESSINGS) IMPLANT
BASKET HEART (ORDER IN 25'S) (MISCELLANEOUS) ×1
BASKET HEART (ORDER IN 25S) (MISCELLANEOUS) ×2 IMPLANT
BENZOIN TINCTURE PRP APPL 2/3 (GAUZE/BANDAGES/DRESSINGS) IMPLANT
BLADE STERNUM SYSTEM 6 (BLADE) ×3 IMPLANT
BLADE SURG ROTATE 9660 (MISCELLANEOUS) ×3 IMPLANT
CANISTER SUCTION 2500CC (MISCELLANEOUS) ×3 IMPLANT
CANNULA GUNDRY RCSP 15FR (MISCELLANEOUS) IMPLANT
CATH CPB KIT OWEN (MISCELLANEOUS) ×3 IMPLANT
CATH THORACIC 28FR (CATHETERS) IMPLANT
CATH THORACIC 28FR RT ANG (CATHETERS) IMPLANT
CATH THORACIC 36FR (CATHETERS) ×3 IMPLANT
CATH THORACIC 36FR RT ANG (CATHETERS) ×3 IMPLANT
CLEANER TIP ELECTROSURG 2X2 (MISCELLANEOUS) ×3 IMPLANT
CLIP APPLIE 9.375 MED OPEN (MISCELLANEOUS) IMPLANT
CLIP APPLIE 9.375 SM OPEN (CLIP) IMPLANT
CLIP FOGARTY SPRING 6M (CLIP) IMPLANT
CLIP TI MEDIUM 24 (CLIP) IMPLANT
CLIP TI WIDE RED SMALL 24 (CLIP) IMPLANT
CLOTH BEACON ORANGE TIMEOUT ST (SAFETY) ×3 IMPLANT
CONN Y 3/8X3/8X3/8  BEN (MISCELLANEOUS)
CONN Y 3/8X3/8X3/8 BEN (MISCELLANEOUS) IMPLANT
COVER SURGICAL LIGHT HANDLE (MISCELLANEOUS) ×3 IMPLANT
CRADLE DONUT ADULT HEAD (MISCELLANEOUS) ×3 IMPLANT
DRAIN CHANNEL 32F RND 10.7 FF (WOUND CARE) ×3 IMPLANT
DRAPE CARDIOVASCULAR INCISE (DRAPES) ×1
DRAPE SLUSH/WARMER DISC (DRAPES) ×3 IMPLANT
DRAPE SRG 135X102X78XABS (DRAPES) ×2 IMPLANT
DRSG COVADERM 4X14 (GAUZE/BANDAGES/DRESSINGS) ×3 IMPLANT
ELECT REM PT RETURN 9FT ADLT (ELECTROSURGICAL) ×6
ELECTRODE REM PT RTRN 9FT ADLT (ELECTROSURGICAL) ×4 IMPLANT
GLOVE BIO SURGEON STRL SZ 6 (GLOVE) ×15 IMPLANT
GLOVE BIO SURGEON STRL SZ 6.5 (GLOVE) ×9 IMPLANT
GLOVE BIO SURGEON STRL SZ7 (GLOVE) IMPLANT
GLOVE BIO SURGEON STRL SZ7.5 (GLOVE) IMPLANT
GLOVE BIOGEL PI IND STRL 6 (GLOVE) IMPLANT
GLOVE BIOGEL PI IND STRL 6.5 (GLOVE) ×8 IMPLANT
GLOVE BIOGEL PI IND STRL 7.0 (GLOVE) ×4 IMPLANT
GLOVE BIOGEL PI INDICATOR 6 (GLOVE)
GLOVE BIOGEL PI INDICATOR 6.5 (GLOVE) ×4
GLOVE BIOGEL PI INDICATOR 7.0 (GLOVE) ×2
GLOVE EUDERMIC 7 POWDERFREE (GLOVE) IMPLANT
GLOVE ORTHO TXT STRL SZ7.5 (GLOVE) ×9 IMPLANT
GOWN STRL NON-REIN LRG LVL3 (GOWN DISPOSABLE) ×24 IMPLANT
HEMOSTAT POWDER SURGIFOAM 1G (HEMOSTASIS) ×9 IMPLANT
INSERT FOGARTY 61MM (MISCELLANEOUS) IMPLANT
INSERT FOGARTY XLG (MISCELLANEOUS) ×3 IMPLANT
KIT BASIN OR (CUSTOM PROCEDURE TRAY) ×3 IMPLANT
KIT ROOM TURNOVER OR (KITS) ×3 IMPLANT
KIT SUCTION CATH 14FR (SUCTIONS) ×9 IMPLANT
KIT VASOVIEW W/TROCAR VH 2000 (KITS) ×3 IMPLANT
LEAD PACING MYOCARDI (MISCELLANEOUS) ×3 IMPLANT
MARKER GRAFT CORONARY BYPASS (MISCELLANEOUS) ×9 IMPLANT
NS IRRIG 1000ML POUR BTL (IV SOLUTION) ×18 IMPLANT
PACK OPEN HEART (CUSTOM PROCEDURE TRAY) ×3 IMPLANT
PAD ARMBOARD 7.5X6 YLW CONV (MISCELLANEOUS) ×3 IMPLANT
PENCIL BUTTON HOLSTER BLD 10FT (ELECTRODE) ×3 IMPLANT
PUNCH AORTIC ROTATE 4.0MM (MISCELLANEOUS) IMPLANT
PUNCH AORTIC ROTATE 4.5MM 8IN (MISCELLANEOUS) IMPLANT
PUNCH AORTIC ROTATE 5MM 8IN (MISCELLANEOUS) IMPLANT
SET CARDIOPLEGIA MPS 5001102 (MISCELLANEOUS) ×3 IMPLANT
SOLUTION ANTI FOG 6CC (MISCELLANEOUS) IMPLANT
SPONGE GAUZE 4X4 12PLY (GAUZE/BANDAGES/DRESSINGS) ×6 IMPLANT
SPONGE LAP 18X18 X RAY DECT (DISPOSABLE) IMPLANT
SPONGE LAP 4X18 X RAY DECT (DISPOSABLE) IMPLANT
SUT BONE WAX W31G (SUTURE) ×3 IMPLANT
SUT ETHIBOND X763 2 0 SH 1 (SUTURE) ×6 IMPLANT
SUT MNCRL AB 3-0 PS2 18 (SUTURE) ×6 IMPLANT
SUT MNCRL AB 4-0 PS2 18 (SUTURE) IMPLANT
SUT PDS AB 1 CTX 36 (SUTURE) ×6 IMPLANT
SUT PROLENE 2 0 SH DA (SUTURE) IMPLANT
SUT PROLENE 3 0 SH DA (SUTURE) ×3 IMPLANT
SUT PROLENE 3 0 SH1 36 (SUTURE) IMPLANT
SUT PROLENE 4 0 RB 1 (SUTURE)
SUT PROLENE 4 0 SH DA (SUTURE) IMPLANT
SUT PROLENE 4-0 RB1 .5 CRCL 36 (SUTURE) IMPLANT
SUT PROLENE 5 0 C 1 36 (SUTURE) IMPLANT
SUT PROLENE 6 0 C 1 30 (SUTURE) ×15 IMPLANT
SUT PROLENE 7.0 RB 3 (SUTURE) ×9 IMPLANT
SUT PROLENE 8 0 BV175 6 (SUTURE) ×9 IMPLANT
SUT PROLENE BLUE 7 0 (SUTURE) ×3 IMPLANT
SUT PROLENE POLY MONO (SUTURE) IMPLANT
SUT SILK  1 MH (SUTURE) ×1
SUT SILK 1 MH (SUTURE) ×2 IMPLANT
SUT STEEL 6MS V (SUTURE) IMPLANT
SUT STEEL STERNAL CCS#1 18IN (SUTURE) IMPLANT
SUT STEEL SZ 6 DBL 3X14 BALL (SUTURE) ×9 IMPLANT
SUT VIC AB 1 CTX 36 (SUTURE)
SUT VIC AB 1 CTX36XBRD ANBCTR (SUTURE) IMPLANT
SUT VIC AB 2-0 CT1 27 (SUTURE)
SUT VIC AB 2-0 CT1 TAPERPNT 27 (SUTURE) IMPLANT
SUT VIC AB 2-0 CTX 27 (SUTURE) IMPLANT
SUT VIC AB 3-0 SH 27 (SUTURE)
SUT VIC AB 3-0 SH 27X BRD (SUTURE) IMPLANT
SUT VIC AB 3-0 X1 27 (SUTURE) IMPLANT
SUT VICRYL 4-0 PS2 18IN ABS (SUTURE) IMPLANT
SUTURE E-PAK OPEN HEART (SUTURE) ×3 IMPLANT
SYSTEM SAHARA CHEST DRAIN ATS (WOUND CARE) ×3 IMPLANT
TAPE CLOTH SURG 4X10 WHT LF (GAUZE/BANDAGES/DRESSINGS) ×3 IMPLANT
TOWEL OR 17X24 6PK STRL BLUE (TOWEL DISPOSABLE) ×6 IMPLANT
TOWEL OR 17X26 10 PK STRL BLUE (TOWEL DISPOSABLE) ×6 IMPLANT
TRAY FOLEY IC TEMP SENS 14FR (CATHETERS) ×3 IMPLANT
TUBE SUCT INTRACARD DLP 20F (MISCELLANEOUS) ×3 IMPLANT
TUBING INSUFFLATION 10FT LAP (TUBING) ×3 IMPLANT
UNDERPAD 30X30 INCONTINENT (UNDERPADS AND DIAPERS) ×3 IMPLANT
WATER STERILE IRR 1000ML POUR (IV SOLUTION) ×6 IMPLANT

## 2013-01-03 NOTE — OR Nursing (Signed)
2300 called at 1152 for 45 minute call; spoke with Charge RN Wynona Canes.  Volunteer desk called at [phone] to notify family that patient is off pump and surgery closing.  20 minute call made to 2300 at 1218; spoke with [facility].

## 2013-01-03 NOTE — Transfer of Care (Signed)
Immediate Anesthesia Transfer of Care Note  Patient: Juan Gates  Procedure(s) Performed: Procedure(s) (LRB) with comments: CORONARY ARTERY BYPASS GRAFTING (CABG) (N/A) INTRAOPERATIVE TRANSESOPHAGEAL ECHOCARDIOGRAM (N/A)  Patient Location: PACU  Anesthesia Type:General  Level of Consciousness: Patient remains intubated per anesthesia plan  Airway & Oxygen Therapy: Patient remains intubated per anesthesia plan and Patient placed on Ventilator (see vital sign flow sheet for setting)  Post-op Assessment: Report given to PACU RN and Post -op Vital signs reviewed and stable  Post vital signs: Reviewed and stable  Complications: No apparent anesthesia complications

## 2013-01-03 NOTE — Procedures (Signed)
Extubation Procedure Note  Patient Details:   Name: Juan Gates DOB: 1956-04-20 MRN: [id number]   Airway Documentation:     Evaluation  O2 sats: stable throughout and currently acceptable Complications: No apparent complications Patient did tolerate procedure well. Bilateral Breath Sounds: Clear   Yes Pt awake and alert. Extubated per protocol, placed on 3L [place], sat 98%. Positive cuff leak, NIF-38, VC 1L, IS 500, BBS cl. Pt able to vocalize.  Arloa Koh 01/03/2013, 5:30 PM

## 2013-01-03 NOTE — Progress Notes (Signed)
  Echocardiogram Echocardiogram Transesophageal has been performed.  Juan Gates 01/03/2013, 9:00 AM

## 2013-01-03 NOTE — Progress Notes (Signed)
Vt and RR increased per MD

## 2013-01-03 NOTE — Anesthesia Preprocedure Evaluation (Addendum)
Anesthesia Evaluation  Patient identified by MRN, date of birth, ID band Patient awake    Reviewed: Allergy & Precautions, H&P , NPO status , Patient's Chart, lab work & pertinent test results, reviewed documented beta blocker date and time   History of Anesthesia Complications Negative for: history of anesthetic complications (first general anesthetic)  Airway Mallampati: II TM Distance: >3 FB Neck ROM: Full    Dental  (+) Edentulous Upper, Edentulous Lower and Dental Advisory Given   Pulmonary Current Smoker,  breath sounds clear to auscultation        Cardiovascular + Past MI Rhythm:Regular Rate:Normal     Neuro/Psych negative neurological ROS  negative psych ROS   GI/Hepatic negative GI ROS, Neg liver ROS,   Endo/Other  negative endocrine ROS  Renal/GU negative Renal ROS     Musculoskeletal negative musculoskeletal ROS (+)   Abdominal   Peds  Hematology negative hematology ROS (+)   Anesthesia Other Findings   Reproductive/Obstetrics                          Anesthesia Physical Anesthesia Plan  ASA: III  Anesthesia Plan: General   Post-op Pain Management:    Induction: Intravenous  Airway Management Planned: Oral ETT  Additional Equipment: Arterial line, TEE and PA Cath  Intra-op Plan:   Post-operative Plan:   Informed Consent: I have reviewed the patients History and Physical, chart, labs and discussed the procedure including the risks, benefits and alternatives for the proposed anesthesia with the patient or authorized representative who has indicated his/her understanding and acceptance.   Dental advisory given  Plan Discussed with: CRNA and Surgeon  Anesthesia Plan Comments:        Anesthesia Quick Evaluation

## 2013-01-03 NOTE — Op Note (Signed)
CARDIOTHORACIC SURGERY OPERATIVE NOTE  Date of Procedure: 01/03/2013  Preoperative Diagnosis: Severe 2-vessel Coronary Artery Disease  Postoperative Diagnosis: Same  Procedure:   Coronary Artery Bypass Grafting x 2   Left Internal Mammary Artery to Distal Left Anterior Descending Coronary Artery  Right Internal Mammary Artery to Obtuse Marginal Branch of Left Circumflex Coronary Artery  Surgeon: Salvatore Decent. Cornelius Moras, MD  Assistant: Lowella Dandy, PA-C  Anesthesia: Josepha Pigg, MD  Operative Findings:  Normal LV systolic function   Good quality IMA conduits   Good quality target vessels   BRIEF CLINICAL NOTE AND INDICATIONS FOR SURGERY  Patient is a 57 year old male with no previous history of coronary artery disease and risk factors notable only for long-standing history of tobacco abuse and newly discovered hypercholesterolemia. The patient states he had been in his usual state of health but in recent months he had developed some mild exertional shortness of breath. Approximately 1-1/2 weeks ago he developed new onset substernal chest tightness that occurred while he was walking in the cold weather duck hunting. This episode was very transient and resolved spontaneously. Since then he had several additional mild episodes of exertional chest tightness. Yesterday the patient developed sudden onset of similar chest tightness which was more severe and radiated down both arms. The time he was watching television. The pain persisted prompting him to present to the emergency department where the pain resolved after a total duration of less than 20 minutes.  EKG revealed sinus rhythm with nonspecific ST changes. Cardiac enzymes were positive and the patient has ruled in for an acute non-ST segment elevation myocardial infarction. The patient has had no further recurrent episodes of chest pain since admission. Cardiac catheterization performed earlier today demonstrates severe two-vessel  coronary artery disease including hazy 90% stenosis of the left circumflex and very long-segment 70% stenosis of the proximal left anterior descending coronary artery.  Left ventricular function was preserved. Cardiothoracic surgical consultation was requested.  The patient has been seen in consultation and counseled at length regarding the indications, risks and potential benefits of surgery.  All questions have been answered, and the patient provides full informed consent for the operation as described.     DETAILS OF THE OPERATIVE PROCEDURE  The patient is brought to the operating room on the above mentioned date and central monitoring was established by the anesthesia team including placement of Swan-Ganz catheter and radial arterial line. The patient is placed in the supine position on the operating table.  Intravenous antibiotics are administered. General endotracheal anesthesia is induced uneventfully. A Foley catheter is placed.  Baseline transesophageal echocardiogram was performed.  Findings were notable for normal LV size and systolic function.  The patient's chest, abdomen, both groins, and both lower extremities are prepared and draped in a sterile manner. A time out procedure is performed.  A median sternotomy incision was performed and both the left and the right internal mammary arteries are dissected from the chest wall and prepared for bypass grafting. Both internal mammary arteries are notably good quality conduit.  Following systemic heparinization, both internal mammary arteries were transected distally noted to have excellent flow.  The pericardium is opened. The ascending aorta is normal in appearance. The ascending aorta and the right atrium are cannulated for cardioplegia bypass.  Adequate heparinization is verified.    The entire pre-bypass portion of the operation was notable for stable hemodynamics.  Cardiopulmonary bypass was begun and the surface of the heart is  inspected. Distal target vessels are selected  for coronary artery bypass grafting. A cardioplegia cannula is placed in the ascending aorta.  A temperature probe was placed in the interventricular septum.  The patient is allowed to cool passively to 34C systemic temperature.  The aortic cross clamp is applied and cold blood cardioplegia is delivered initially in an antegrade fashion through the aortic root.   Iced saline slush is applied for topical hypothermia.  The initial cardioplegic arrest is rapid with early diastolic arrest.  Repeat doses of cardioplegia are administered intermittently throughout the entire cross clamp portion of the operation through the aortic root in order to maintain completely flat electrocardiogram and septal myocardial temperature below 15C.  Myocardial protection was felt to be  excellent.  The following distal coronary artery bypass grafts were performed:   The obtuse marginal branch of the left circumflex coronary artery was grafted using the right internal mammary artery in an end-to-side fashion.  The right internal mammary artery is utilized in-situ with the pedicle place posterior to the aorta through the transverse sinus.  At the site of distal anastomosis the target vessel was good quality and measured approximately 2.0 mm in diameter.  The distal left anterior coronary artery was grafted with the left internal mammary artery in an end-to-side fashion.  At the site of distal anastomosis the target vessel was good quality and measured approximately 1.8 mm in diameter.    The septal myocardial temperature rose rapidly after reperfusion of the left internal mammary artery graft.  The aortic cross clamp was removed after a total cross clamp time of 39 minutes.  Both distal coronary anastomoses were inspected for hemostasis and appropriate graft orientation. Epicardial pacing wires are fixed to the right ventricular outflow tract and to the right atrial appendage.  The patient is rewarmed to 37C temperature. The patient is weaned and disconnected from cardiopulmonary bypass.  The patient's rhythm at separation from bypass was normal sinus.  The patient was weaned from cardioplegic bypass without any inotropic support. Total cardiopulmonary bypass time for the operation was 55 minutes.  Followup transesophageal echocardiogram performed after separation from bypass revealed no changes from the preoperative exam.  The aortic and venous cannula were removed uneventfully. Protamine was administered to reverse the anticoagulation. The mediastinum and pleural space were inspected for hemostasis and irrigated with saline solution. The mediastinum and both pleural spaces were drained using 4 chest tubes placed through separate stab incisions inferiorly.  The soft tissues anterior to the aorta were reapproximated loosely. The sternum is closed with double strength sternal wire. The soft tissues anterior to the sternum were closed in multiple layers and the skin is closed with a running subcuticular skin closure.  The post-bypass portion of the operation was notable for stable rhythm and hemodynamics.  No blood products were administered during the operation.  The patient tolerated the procedure well and is transported to the surgical intensive care in stable condition. There are no intraoperative complications. All sponge instrument and needle counts are verified correct at completion of the operation.    Salvatore Decent. Cornelius Moras MD 01/03/2013 1:20 PM

## 2013-01-03 NOTE — Brief Op Note (Addendum)
12/31/2012 - 01/03/2013  11:37 AM  PATIENT:  Estelle Grumbles  57 y.o. male  PRE-OPERATIVE DIAGNOSIS:  CAD  POST-OPERATIVE DIAGNOSIS:  Coronary Artery Disease   PROCEDURE:  Procedure(s) (LRB) with comments:  CORONARY ARTERY BYPASS GRAFTING x2 -LIMA to LAD -RIMA to OM  SURGEON:    Purcell Nails, MD  ASSISTANTS:  Lowella Dandy, PA-C  ANESTHESIA:   Josepha Pigg, MD  CROSSCLAMP TIME:   78'  CARDIOPULMONARY BYPASS TIME: 75'  FINDINGS:  Normal LV systolic function  Good quality IMA conduits  Good quality target vessels  COMPLICATIONS: none  PATIENT DISPOSITION:   TO SICU IN STABLE CONDITION  OWEN,CLARENCE H 01/03/2013 12:29 PM

## 2013-01-03 NOTE — Progress Notes (Signed)
Patient ID: Juan Gates, male   DOB: [birth year], 57 y.o.   MRN: [id number]  Hemodynamically stable  Extubated and alert  Urine output good CT output low. Labs ok  Continue routine postop plans.

## 2013-01-04 ENCOUNTER — Inpatient Hospital Stay (HOSPITAL_COMMUNITY): Payer: Medicaid Other

## 2013-01-04 ENCOUNTER — Encounter (HOSPITAL_COMMUNITY): Payer: Self-pay | Admitting: Thoracic Surgery (Cardiothoracic Vascular Surgery)

## 2013-01-04 LAB — CBC
HCT: 39.3 % (ref 39.0–52.0)
HCT: 40.9 % (ref 39.0–52.0)
Hemoglobin: 13.3 g/dL (ref 13.0–17.0)
Hemoglobin: 13.8 g/dL (ref 13.0–17.0)
MCH: 32.3 pg (ref 26.0–34.0)
MCHC: 33.8 g/dL (ref 30.0–36.0)
MCHC: 33.7 g/dL (ref 30.0–36.0)
MCV: 95.4 fL (ref 78.0–100.0)
MCV: 95.8 fL (ref 78.0–100.0)
Platelets: 235 K/uL (ref 150–400)
RBC: 4.12 MIL/uL — ABNORMAL LOW (ref 4.22–5.81)
RDW: 12.7 % (ref 11.5–15.5)
WBC: 15.5 K/uL — ABNORMAL HIGH (ref 4.0–10.5)

## 2013-01-04 LAB — POCT I-STAT, CHEM 8
BUN: 13 mg/dL (ref 6–23)
Calcium, Ion: 1.19 mmol/L (ref 1.12–1.23)
Chloride: 99 mEq/L (ref 96–112)
Creatinine, Ser: 0.8 mg/dL (ref 0.50–1.35)
Glucose, Bld: 114 mg/dL — ABNORMAL HIGH (ref 70–99)
HCT: 41 % (ref 39.0–52.0)
Potassium: 4 mEq/L (ref 3.5–5.1)

## 2013-01-04 LAB — BASIC METABOLIC PANEL WITH GFR
BUN: 14 mg/dL (ref 6–23)
CO2: 21 meq/L (ref 19–32)
Calcium: 8.5 mg/dL (ref 8.4–10.5)
Chloride: 103 meq/L (ref 96–112)
Creatinine, Ser: 0.71 mg/dL (ref 0.50–1.35)
GFR calc Af Amer: >90 mL/min
GFR calc non Af Amer: >90 mL/min
Glucose, Bld: 123 mg/dL — ABNORMAL HIGH (ref 70–99)
Potassium: 4.2 meq/L (ref 3.5–5.1)
Sodium: 134 meq/L — ABNORMAL LOW (ref 135–145)

## 2013-01-04 LAB — GLUCOSE, CAPILLARY
Glucose-Capillary: 91 mg/dL (ref 70–99)
Glucose-Capillary: 119 mg/dL — ABNORMAL HIGH (ref 70–99)

## 2013-01-04 LAB — CREATININE, SERUM
GFR calc Af Amer: >90 mL/min (ref >90)
GFR calc non Af Amer: >90 mL/min (ref >90)

## 2013-01-04 LAB — MAGNESIUM: Magnesium: 2.1 mg/dL (ref 1.5–2.5)

## 2013-01-04 MED ORDER — LORAZEPAM 0.5 MG PO TABS
0.5000 mg | ORAL_TABLET | Freq: Four times a day (QID) | ORAL | Status: DC | PRN
  Administered 2013-01-04 – 2013-01-05 (×3): 0.5 mg via ORAL
  Filled 2013-01-04 (×3): qty 1

## 2013-01-04 MED ORDER — METOPROLOL TARTRATE 50 MG PO TABS
50.0000 mg | ORAL_TABLET | Freq: Two times a day (BID) | ORAL | Status: DC
  Administered 2013-01-04 – 2013-01-06 (×5): 50 mg via ORAL
  Filled 2013-01-04 (×7): qty 1

## 2013-01-04 MED ORDER — METOPROLOL TARTRATE 25 MG PO TABS
25.0000 mg | ORAL_TABLET | Freq: Two times a day (BID) | ORAL | Status: DC
  Administered 2013-01-04: 25 mg via ORAL
  Filled 2013-01-04 (×2): qty 1

## 2013-01-04 MED ORDER — MORPHINE SULFATE 2 MG/ML IJ SOLN
2.0000 mg | INTRAMUSCULAR | Status: DC | PRN
  Administered 2013-01-04 – 2013-01-05 (×12): 2 mg via INTRAVENOUS
  Filled 2013-01-04 (×12): qty 1

## 2013-01-04 MED ORDER — FUROSEMIDE 10 MG/ML IJ SOLN
20.0000 mg | Freq: Three times a day (TID) | INTRAMUSCULAR | Status: AC
  Administered 2013-01-04 (×3): 20 mg via INTRAVENOUS
  Filled 2013-01-04 (×2): qty 2

## 2013-01-04 MED ORDER — INSULIN ASPART 100 UNIT/ML ~~LOC~~ SOLN
0.0000 [IU] | SUBCUTANEOUS | Status: DC
  Administered 2013-01-04 – 2013-01-05 (×3): 2 [IU] via SUBCUTANEOUS

## 2013-01-04 MED ORDER — MIDAZOLAM HCL 2 MG/2ML IJ SOLN
2.0000 mg | Freq: Once | INTRAMUSCULAR | Status: AC
Start: 2013-01-04 — End: 2013-01-04
  Administered 2013-01-04: 2 mg via INTRAVENOUS
  Filled 2013-01-04: qty 2

## 2013-01-04 MED FILL — Mannitol IV Soln 20%: INTRAVENOUS | Qty: 500 | Status: AC

## 2013-01-04 MED FILL — Heparin Sodium (Porcine) Inj 1000 Unit/ML: INTRAMUSCULAR | Qty: 10 | Status: AC

## 2013-01-04 MED FILL — Sodium Bicarbonate IV Soln 8.4%: INTRAVENOUS | Qty: 50 | Status: AC

## 2013-01-04 MED FILL — Electrolyte-R (PH 7.4) Solution: INTRAVENOUS | Qty: 3000 | Status: AC

## 2013-01-04 MED FILL — Heparin Sodium (Porcine) Inj 1000 Unit/ML: INTRAMUSCULAR | Qty: 30 | Status: AC

## 2013-01-04 MED FILL — Sodium Chloride Irrigation Soln 0.9%: Qty: 3000 | Status: AC

## 2013-01-04 MED FILL — Sodium Chloride IV Soln 0.9%: INTRAVENOUS | Qty: 1000 | Status: AC

## 2013-01-04 MED FILL — Potassium Chloride Inj 2 mEq/ML: INTRAVENOUS | Qty: 40 | Status: AC

## 2013-01-04 MED FILL — Magnesium Sulfate Inj 50%: INTRAMUSCULAR | Qty: 10 | Status: AC

## 2013-01-04 MED FILL — Lidocaine HCl IV Inj 20 MG/ML: INTRAVENOUS | Qty: 5 | Status: AC

## 2013-01-04 NOTE — Progress Notes (Signed)
   CARDIOTHORACIC SURGERY PROGRESS NOTE   R1 Day Post-Op Procedure(s) (LRB): CORONARY ARTERY BYPASS GRAFTING (CABG) (N/A) INTRAOPERATIVE TRANSESOPHAGEAL ECHOCARDIOGRAM (N/A)  Subjective: Looks good.  Feels sore in chest and still a bit anxious, but overall doing very well.  Objective: Vital signs: BP Readings from Last 1 Encounters:  01/04/13 133/72   Pulse Readings from Last 1 Encounters:  01/04/13 97   Resp Readings from Last 1 Encounters:  01/04/13 19   Temp Readings from Last 1 Encounters:  01/04/13 98.6 F (37 C) Core (Comment)    Hemodynamics: PAP: (21-47)/(9-27) 39/23 mmHg CO:  [4.4 L/min-6.6 L/min] 4.6 L/min CI:  [1.9 L/min/m2-2.9 L/min/m2] 2.1 L/min/m2  Physical Exam:  Rhythm:   sinus  Breath sounds: Fairly clear, few rhonchi, no wheezes  Heart sounds:  RRR  Incisions:  Dressings dry and intact  Abdomen:  soft  Extremities:  warm   Intake/Output from previous day: 01/09 0701 - 01/10 0700 In: 6179.6 [P.O.:240; I.V.:4361.6; Blood:574; IV Piggyback:1004] Out: 3345 [Urine:1725; Blood:750; Chest Tube:870] Intake/Output this shift: Total I/O In: 40 [I.V.:40] Out: 130 [Urine:60; Chest Tube:70]  Lab Results:  [facility] 01/04/13 0415 01/03/13 1845  WBC 15.5* 16.6*  HGB 13.3 14.0  HCT 39.3 43.0  PLT 235 232   BMET:  Basename 01/04/13 0415 01/03/13 1845 01/03/13 1843 01/02/13 0655  NA 134* -- 144 --  K 4.2 -- 3.9 --  CL 103 -- 105 --  CO2 21 -- -- 21  GLUCOSE 123* -- 109* --  BUN 14 -- 10 --  CREATININE 0.71 0.74 -- --  CALCIUM 8.5 -- -- 9.1    CBG (last 3)   Basename 01/04/13 0432 01/03/13 2343 01/03/13 2149  GLUCAP 91 95 93   ABG    Component Value Date/Time   PHART 7.349* 01/03/2013 1857   HCO3 21.6 01/03/2013 1857   TCO2 23 01/03/2013 1857   ACIDBASEDEF 4.0* 01/03/2013 1857   O2SAT 97.0 01/03/2013 1857   CXR: Bibasilar atelectasis  Assessment/Plan: S/P Procedure(s) (LRB): CORONARY ARTERY BYPASS GRAFTING (CABG) (N/A) INTRAOPERATIVE  TRANSESOPHAGEAL ECHOCARDIOGRAM (N/A)  Doing well POD1 Expected post op volume excess, mild Longstanding tobacco abuse Anxiety ? Alcohol consumption   Mobilize  Diuresis  D/C tubes and lines  Ativan as needed  Aggressive pulm toilet   OWEN,CLARENCE H 01/04/2013 8:21 AM

## 2013-01-04 NOTE — Anesthesia Postprocedure Evaluation (Signed)
  Anesthesia Post-op Note  Patient: Juan Gates  Procedure(s) Performed: Procedure(s) (LRB) with comments: CORONARY ARTERY BYPASS GRAFTING (CABG) (N/A) INTRAOPERATIVE TRANSESOPHAGEAL ECHOCARDIOGRAM (N/A)  Patient Location: PACU and SICU  Anesthesia Type:General  Level of Consciousness: awake, alert  and oriented  Airway and Oxygen Therapy: Patient Spontanous Breathing and Patient connected to nasal cannula oxygen  Post-op Pain: mild  Post-op Assessment: Post-op Vital signs reviewed, Patient's Cardiovascular Status Stable, Respiratory Function Stable and Patent Airway  Post-op Vital Signs: Reviewed and stable  Complications: No apparent anesthesia complications

## 2013-01-04 NOTE — Progress Notes (Signed)
[facility] BRIEF SICU PROGRESS NOTE  1 Day Post-Op  S/P Procedure(s) (LRB): CORONARY ARTERY BYPASS GRAFTING (CABG) (N/A) INTRAOPERATIVE TRANSESOPHAGEAL ECHOCARDIOGRAM (N/A)   Stable day Still sore in chest but moving fairly well Sinus tach w/ stable BP O2 sats > 90% on 4 L/min UOP excellent   Plan: Continue current plan.  Increase beta blocker  Gates,Juan H 01/04/2013 7:57 PM

## 2013-01-04 NOTE — Progress Notes (Addendum)
Subjective:  Sore.   Objective:  Vital Signs in the last 24 hours: Temp:  [97.7 F (36.5 C)-100 F (37.8 C)] 98.6 F (37 C) (01/10 0800) Pulse Rate:  [86-113] 100  (01/10 0900) Resp:  [11-33] 27  (01/10 0900) BP: (82-133)/(50-79) 129/74 mmHg (01/10 0900) SpO2:  [88 %-100 %] 92 % (01/10 0900) Arterial Line BP: (76-138)/(47-91) 115/68 mmHg (01/10 0900) FiO2 (%):  [40 %-50 %] 40 % (01/09 1652) Weight:  [112 kg (246 lb 14.6 oz)] 112 kg (246 lb 14.6 oz) (01/10 0500)  Intake/Output from previous day:  Intake/Output Summary (Last 24 hours) at 01/04/13 1022 Last data filed at 01/04/13 0915  Gross per 24 hour  Intake 4361.59 ml  Output   3240 ml  Net 1121.59 ml      . acetaminophen  1,000 mg Oral Q6H  . aspirin EC  325 mg Oral Daily  . bisacodyl  10 mg Oral Daily   Or  . bisacodyl  10 mg Rectal Daily  . cefUROXime (ZINACEF)  IV  1.5 g Intravenous Q12H  . docusate sodium  200 mg Oral Daily  . folic acid  1 mg Oral Daily  . furosemide  20 mg Intravenous Q8H  . insulin aspart  0-24 Units Subcutaneous Q4H  . metoprolol tartrate  25 mg Oral BID  . pantoprazole  40 mg Oral Daily  . simvastatin  20 mg Oral q1800  . sodium chloride  3 mL Intravenous Q12H  . thiamine  100 mg Oral Daily   Physical Exam: General appearance: alert, cooperative and no distress  No JVD Lungs: clear to auscultation bilaterally Heart: regular rate and rhythm; no rub Abs: soft BS+ No sig edema   Rate: 100  Rhythm: normal sinus rhythm and sinus tachycardia  Lab Results:  Basename 01/04/13 0415 01/03/13 1845  WBC 15.5* 16.6*  HGB 13.3 14.0  PLT 235 232    Basename 01/04/13 0415 01/03/13 1845 01/03/13 1843 01/02/13 0655  NA 134* -- 144 --  K 4.2 -- 3.9 --  CL 103 -- 105 --  CO2 21 -- -- 21  GLUCOSE 123* -- 109* --  BUN 14 -- 10 --  CREATININE 0.71 0.74 -- --   No results found for this basename: TROPONINI:2,CK,MB:2 in the last 72 hours Hepatic Function Panel  Basename 01/02/13 0655    PROT 7.0  ALBUMIN 3.8  AST 23  ALT 27  ALKPHOS 66  BILITOT 0.6  BILIDIR --  IBILI --   No results found for this basename: CHOL in the last 72 hours  Basename 01/03/13 1316  INR 1.17   Lipid Panel     Component Value Date/Time   CHOL 281* 01/01/2013 0500   TRIG 230* 01/01/2013 0500   HDL 36* 01/01/2013 0500   CHOLHDL 7.8 01/01/2013 0500   VLDL 46* 01/01/2013 0500   LDLCALC 199* 01/01/2013 0500    Imaging: Imaging results have been reviewed  Cardiac Studies:  Assessment/Plan:   Principal Problem:  *S/P CABG x 2, (LIMA-LAD and RIMA-OM) 01/03/13 Active Problems:  NSTEMI- S/P cath 1/7- severe 2 vessel dz; EF 55%  Hypercholesterolemia  Smoker  Plan- He is on Lopressor and Zocor 20mg . His LDL was 199 and Trig 230. He will need higher dose of statin and possibly fish oil. This can be done as an OP.     Corine Shelter PA-C 01/04/2013, 10:22 AM    Patient seen and examined. Agree with assessment and plan. Will change top atorvastatin 40 mg.  Stable initial hemodynamics. Continue diuresis. Discussed smoking cessation   Lennette Bihari, MD, [facility] 01/04/2013 2:57 PM

## 2013-01-04 NOTE — Op Note (Signed)
NAME:  Juan Gates, Juan Gates NO.:  [id number]  MEDICAL RECORD NO.:  [id number]  LOCATION:  2314                         FACILITY:  [facility]  PHYSICIAN:  Burna Forts, M.D.DATE OF BIRTH:  28-Feb-1956  DATE OF PROCEDURE:  01/04/2013 DATE OF DISCHARGE:                              OPERATIVE REPORT   INDICATIONS FOR PROCEDURE:  [name] presents today for coronary artery bypass grafting to be performed by Dr. Tressie Stalker.  We have been asked to place a TEE probe for evaluation of cardiac structures and function.  On arrival to the holding area, the radial arterial and pulmonary artery lines are placed under local anesthesia with sedation. The patient was taken to the OR for routine induction of general anesthesia, after which the TEE probe is prepared and passed oropharyngeally, into the stomach, then slightly withdrawn for imaging of the cardiac structures.  PRECARDIOPULMONARY BYPASS TEE EXAMINATION:  Left ventricle:  The left ventricular chamber is seen initially in the short axis view.  There is normal wall thickness, and overall normal contractile pattern is appreciated.  In the long axis view, there is appearance of some apical thinning associated with some apical hypokinesis of that wall motion. Overall, anterior and posterior walls are contractile.  Essentially, normal EF greater than about 40-45%.  Papillary muscles are well outlined.  Mitral valve:  The mitral valve is initially seen in the four-chamber view.  They are mildly thickened leaflets.  There is normal excursion. Normal opening of the left mitral leaflets.  On color Doppler, there is trivial mitral regurgitant flow appreciated.  Aortic valve:  There are 3 cusps apparent in the aortic valve when seen initially in the short axis view.  They opened well and appeared to collapse satisfactorily.  Long and short axis views are then obtained. There is apparent aortic sclerosis, but this is  mild.  Right atrium and left atrium:  These are normal chamber in size, shape, and structure.  No masses are appreciated within.  The interatrial septum is interrogated and is intact.  Right ventricle:  This is a normal right ventricular chamber appreciated.  The patient was placed on cardiopulmonary bypass.  Coronary artery bypass grafting is carried out.  The patient separated with initial attempt from cardiopulmonary bypass.  Post cardiopulmonary bypass TEE examination (limited exam).  Left ventricle:  In the early bypass, there is some mild diminution of the left ventricular contractile pattern appreciated.  With time, and from cardiopulmonary bypass, the ventricle became more contractile and more vigorous in this contractile pattern, which was seen in both short and long axis views.  The rest of the cardiac examination was unchanged from the prebypass and the patient was ultimately returned to the cardiac intensive care unit in stable condition.          ______________________________ Burna Forts, M.D.     JTM/MEDQ  D:  01/04/2013  T:  01/04/2013  Job:  [id number]

## 2013-01-04 NOTE — Care Management Note (Signed)
    Page 1 of 1   01/08/2013     2:46:04 PM   CARE MANAGEMENT NOTE 01/08/2013  Patient:  Juan Gates, Juan Gates   Account Number:  [id number]  Date Initiated:  01/03/2013  Documentation initiated by:  Avie Arenas  Subjective/Objective Assessment:   Post op CABG x2     Action/Plan:   WILL FOLLOW FOR HOME NEEDS.   Anticipated DC Date:  01/09/2013   Anticipated DC Plan:  HOME W HOME HEALTH SERVICES      DC Planning Services  CM consult      Choice offered to / List presented to:             Status of service:  Completed, signed off Medicare Important Message given?   (If response is "NO", the following Medicare IM given date fields will be blank) Date Medicare IM given:   Date Additional Medicare IM given:    Discharge Disposition:  HOME/SELF CARE  Per UR Regulation:  Reviewed for med. necessity/level of care/duration of stay  If discussed at Long Length of Stay Meetings, dates discussed:    Comments:  ContactVenetia Constable Sister   [phone]  01/08/13 Zaylia Riolo,RN,BSN [phone] PT IS ELIGIBLE FOR [facility] MATCH PROGRAM.  PT GIVEN MATCH LETTER WITH EXPLANATION OF PROGRAM BENEFITS.  PT CAN OBTAIN ALL DC MEDS MINUS NARCOTICS FOR $3 COPAY.  HE IS APPRECIATIVE OF HELP GIVEN.  MOST OF DC MEDS ARE GENERIC AND WILL BE AVAILABLE AT [facility] FOR $4 COPAY.  01-05-12 11:40am Avie Arenas, RNBSN [phone] Talked with patient.  Does not have insurance - concerned about hospital payments and dc meds.  Financial counselor notified.  CM will follow for medication needs on discharge.  Patient states when discharged plans to live with sister until ready to go back to his house alone.  His house is located right behind sisters house.

## 2013-01-05 ENCOUNTER — Inpatient Hospital Stay (HOSPITAL_COMMUNITY): Payer: Medicaid Other

## 2013-01-05 LAB — BASIC METABOLIC PANEL
BUN: 13 mg/dL (ref 6–23)
CO2: 25 mEq/L (ref 19–32)
Calcium: 8.6 mg/dL (ref 8.4–10.5)
Chloride: 94 mEq/L — ABNORMAL LOW (ref 96–112)
Creatinine, Ser: 0.75 mg/dL (ref 0.50–1.35)
Glucose, Bld: 122 mg/dL — ABNORMAL HIGH (ref 70–99)

## 2013-01-05 LAB — CBC
HCT: 39.3 % (ref 39.0–52.0)
MCH: 32.5 pg (ref 26.0–34.0)
MCHC: 34.1 g/dL (ref 30.0–36.0)
MCV: 95.4 fL (ref 78.0–100.0)
Platelets: 208 10*3/uL (ref 150–400)
RDW: 12.7 % (ref 11.5–15.5)
WBC: 20.9 10*3/uL — ABNORMAL HIGH (ref 4.0–10.5)

## 2013-01-05 LAB — GLUCOSE, CAPILLARY
Glucose-Capillary: 118 mg/dL — ABNORMAL HIGH (ref 70–99)
Glucose-Capillary: 113 mg/dL — ABNORMAL HIGH (ref 70–99)
Glucose-Capillary: 123 mg/dL — ABNORMAL HIGH (ref 70–99)
Glucose-Capillary: 128 mg/dL — ABNORMAL HIGH (ref 70–99)

## 2013-01-05 MED ORDER — SODIUM CHLORIDE 0.9 % IV SOLN: 250.0000 mL | INTRAVENOUS | Status: DC | PRN

## 2013-01-05 MED ORDER — ATORVASTATIN CALCIUM 40 MG PO TABS
40.0000 mg | ORAL_TABLET | Freq: Every day | ORAL | Status: DC
  Administered 2013-01-05 – 2013-01-07 (×3): 40 mg via ORAL
  Filled 2013-01-05 (×4): qty 1

## 2013-01-05 MED ORDER — CEFUROXIME SODIUM 1.5 G IJ SOLR
1.5000 g | Freq: Once | INTRAMUSCULAR | Status: DC
  Filled 2013-01-05: qty 1.5

## 2013-01-05 MED ORDER — TRAMADOL HCL 50 MG PO TABS
50.0000 mg | ORAL_TABLET | ORAL | Status: DC | PRN
  Administered 2013-01-06 (×2): 100 mg via ORAL
  Filled 2013-01-05 (×2): qty 2

## 2013-01-05 MED ORDER — DOXYCYCLINE HYCLATE 100 MG PO TABS
100.0000 mg | ORAL_TABLET | Freq: Two times a day (BID) | ORAL | Status: DC
  Administered 2013-01-05 – 2013-01-08 (×7): 100 mg via ORAL
  Filled 2013-01-05 (×8): qty 1

## 2013-01-05 MED ORDER — DEXTROSE 5 % IV SOLN
1.5000 g | Freq: Once | INTRAVENOUS | Status: DC
  Filled 2013-01-05: qty 1.5

## 2013-01-05 MED ORDER — LORAZEPAM 1 MG PO TABS
1.0000 mg | ORAL_TABLET | Freq: Four times a day (QID) | ORAL | Status: DC | PRN
  Administered 2013-01-05: 0.5 mg via ORAL
  Administered 2013-01-05 – 2013-01-07 (×5): 1 mg via ORAL
  Filled 2013-01-05 (×6): qty 1

## 2013-01-05 MED ORDER — IPRATROPIUM-ALBUTEROL 18-103 MCG/ACT IN AERO
2.0000 | INHALATION_SPRAY | Freq: Four times a day (QID) | RESPIRATORY_TRACT | Status: DC | PRN
  Filled 2013-01-05: qty 14.7

## 2013-01-05 MED ORDER — GUAIFENESIN ER 600 MG PO TB12
600.0000 mg | ORAL_TABLET | Freq: Two times a day (BID) | ORAL | Status: DC | PRN
  Administered 2013-01-05: 600 mg via ORAL
  Filled 2013-01-05 (×2): qty 1

## 2013-01-05 MED ORDER — SODIUM CHLORIDE 0.9 % IJ SOLN: 3.0000 mL | INTRAMUSCULAR | Status: DC | PRN

## 2013-01-05 MED ORDER — SODIUM CHLORIDE 0.9 % IJ SOLN
3.0000 mL | Freq: Two times a day (BID) | INTRAMUSCULAR | Status: DC
  Administered 2013-01-05 – 2013-01-07 (×4): 3 mL via INTRAVENOUS

## 2013-01-05 MED ORDER — DEXTROSE 5 % IV SOLN
1.5000 g | Freq: Two times a day (BID) | INTRAVENOUS | Status: AC
  Administered 2013-01-05 – 2013-01-06 (×4): 1.5 g via INTRAVENOUS
  Filled 2013-01-05 (×4): qty 1.5

## 2013-01-05 MED ORDER — POTASSIUM CHLORIDE CRYS ER 20 MEQ PO TBCR
40.0000 meq | EXTENDED_RELEASE_TABLET | Freq: Once | ORAL | Status: AC
  Administered 2013-01-05: 40 meq via ORAL
  Filled 2013-01-05: qty 2

## 2013-01-05 MED ORDER — FUROSEMIDE 10 MG/ML IJ SOLN
40.0000 mg | Freq: Once | INTRAMUSCULAR | Status: AC
  Administered 2013-01-05: 40 mg via INTRAVENOUS
  Filled 2013-01-05: qty 4

## 2013-01-05 MED ORDER — MOVING RIGHT ALONG BOOK
Freq: Once | Status: AC
  Administered 2013-01-05: 11:00:00
  Filled 2013-01-05: qty 1

## 2013-01-05 NOTE — Progress Notes (Addendum)
   CARDIOTHORACIC SURGERY PROGRESS NOTE   R2 Days Post-Op Procedure(s) (LRB): CORONARY ARTERY BYPASS GRAFTING (CABG) (N/A) INTRAOPERATIVE TRANSESOPHAGEAL ECHOCARDIOGRAM (N/A)  Subjective: Feels better.  Still sore in chest but improved.  + productive cough  Objective: Vital signs: BP Readings from Last 1 Encounters:  01/05/13 144/69   Pulse Readings from Last 1 Encounters:  01/05/13 105   Resp Readings from Last 1 Encounters:  01/05/13 24   Temp Readings from Last 1 Encounters:  01/05/13 97.6 F (36.4 C) Oral    Hemodynamics:    Physical Exam:  Rhythm:   sinus  Breath sounds: Scattered rhonchi and wheezes  Heart sounds:  RRR  Incisions:  Clean and dry  Abdomen:  soft  Extremities:  warm   Intake/Output from previous day: 01/10 0701 - 01/11 0700 In: 1468 [P.O.:940; I.V.:420; IV Piggyback:108] Out: 2690 [Urine:2620; Chest Tube:70] Intake/Output this shift: Total I/O In: 120 [P.O.:100; I.V.:20] Out: -   Lab Results:  Basename 01/05/13 0326 01/04/13 1607 01/04/13 1600  WBC 20.9* -- 19.8*  HGB 13.4 13.9 --  HCT 39.3 41.0 --  PLT 208 -- 221   BMET:  Basename 01/05/13 0326 01/04/13 1607 01/04/13 0415  NA 131* 135 --  K 3.9 4.0 --  CL 94* 99 --  CO2 25 -- 21  GLUCOSE 122* 114* --  BUN 13 13 --  CREATININE 0.75 0.80 --  CALCIUM 8.6 -- 8.5    CBG (last 3)   Basename 01/05/13 0757 01/05/13 0433 01/05/13 0013  GLUCAP 113* 118* 136*   ABG    Component Value Date/Time   PHART 7.349* 01/03/2013 1857   HCO3 21.6 01/03/2013 1857   TCO2 25 01/04/2013 1607   ACIDBASEDEF 4.0* 01/03/2013 1857   O2SAT 97.0 01/03/2013 1857   CXR: *RADIOLOGY REPORT*  Clinical Data: Postop CABG.  PORTABLE CHEST - 1 VIEW  Comparison: 01/04/2013  Findings: Interval removal of bilateral chest tubes and Swan-Ganz  catheter. No pneumothorax. Low lung volumes with bibasilar  atelectasis. Mild cardiomegaly. Vascular congestion. No real  change since prior study.  IMPRESSION:    Interval removal of bilateral chest tubes without pneumothorax.  Continued low lung volumes with bibasilar atelectasis and vascular  congestion.  Original Report Authenticated By: Charlett Nose, M.D.    Assessment/Plan: S/P Procedure(s) (LRB): CORONARY ARTERY BYPASS GRAFTING (CABG) (N/A) INTRAOPERATIVE TRANSESOPHAGEAL ECHOCARDIOGRAM (N/A)  Overall stable POD2 Long-standing tobacco abuse w/ chronic cough Current productive cough, likely chronic bronchitis Postop atelectasis Anxiety - stable/improved   Mobilize  Diuresis  Add humibid and combivent nebs  Pulm toilet  Doxycycline x 5 days for bronchitis  Transfer step down  OWEN,CLARENCE H 01/05/2013 9:49 AM

## 2013-01-05 NOTE — Progress Notes (Signed)
[facility] BRIEF SICU PROGRESS NOTE  2 Days Post-Op  S/P Procedure(s) (LRB): CORONARY ARTERY BYPASS GRAFTING (CABG) (N/A) INTRAOPERATIVE TRANSESOPHAGEAL ECHOCARDIOGRAM (N/A)   Stable day Diuresing very well Awaiting bed on step down for transfer  Plan: Continue current plan  Kammi Hechler H 01/05/2013 3:52 PM

## 2013-01-05 NOTE — Progress Notes (Signed)
[facility] Progress Note  Subjective:  Feels better; ambulating.  Objective:   Vital Signs in the last 24 hours: Temp:  [97.6 F (36.4 C)-99.2 F (37.3 C)] 97.6 F (36.4 C) (01/11 0759) Pulse Rate:  [95-110] 105  (01/11 0800) Resp:  [14-33] 24  (01/11 0800) BP: (117-166)/(69-91) 144/69 mmHg (01/11 0800) SpO2:  [86 %-98 %] 93 % (01/11 0800)  Intake/Output from previous day: 01/10 0701 - 01/11 0700 In: 1468 [P.O.:940; I.V.:420; IV Piggyback:108] Out: 2690 [Urine:2620; Chest Tube:70]  Scheduled:   . acetaminophen  1,000 mg Oral Q6H  . aspirin EC  325 mg Oral Daily  . bisacodyl  10 mg Oral Daily   Or  . bisacodyl  10 mg Rectal Daily  . cefUROXime (ZINACEF)  IV  1.5 g Intravenous Q12H  . docusate sodium  200 mg Oral Daily  . folic acid  1 mg Oral Daily  . insulin aspart  0-24 Units Subcutaneous Q4H  . metoprolol tartrate  50 mg Oral BID  . pantoprazole  40 mg Oral Daily  . simvastatin  20 mg Oral q1800  . sodium chloride  3 mL Intravenous Q12H  . thiamine  100 mg Oral Daily    Physical Exam:     General appearance: alert, cooperative and no distress Neck: no JVD and supple, symmetrical, trachea midline Lungs: no wheezing Heart: S1, S2 normal and no rub Abdomen: soft, non-tender; bowel sounds normal; no masses,  no organomegaly Extremities: trivial edema   Rate: 110  Rhythm: sinus tachycardia  Lab Results:    Basename 01/05/13 0326 01/04/13 1607 01/04/13 0415  NA 131* 135 --  K 3.9 4.0 --  CL 94* 99 --  CO2 25 -- 21  GLUCOSE 122* 114* --  BUN 13 13 --  CREATININE 0.75 0.80 --   No results found for this basename: TROPONINI:2,CK,MB:2 in the last 72 hours Hepatic Function Panel No results found for this basename: PROT,ALBUMIN,AST,ALT,ALKPHOS,BILITOT,BILIDIR,IBILI in the last 72 hours  Basename 01/03/13 1316  INR 1.17    Lipid Panel     Component Value Date/Time   CHOL 281* 01/01/2013 0500   TRIG 230* 01/01/2013 0500   HDL 36* 01/01/2013 0500   CHOLHDL 7.8 01/01/2013 0500   VLDL 46* 01/01/2013 0500   LDLCALC 199* 01/01/2013 0500     Imaging:  Dg Chest Portable 1 View In Am  01/05/2013  *RADIOLOGY REPORT*  Clinical Data: Postop CABG.  PORTABLE CHEST - 1 VIEW  Comparison: 01/04/2013  Findings: Interval removal of bilateral chest tubes and Swan-Ganz catheter.  No pneumothorax.  Low lung volumes with bibasilar atelectasis.  Mild cardiomegaly.  Vascular congestion.  No real change since prior study.  IMPRESSION: Interval removal of bilateral chest tubes without pneumothorax.  Continued low lung volumes with bibasilar atelectasis and vascular congestion.   Original Report Authenticated By: Charlett Nose, M.D.    Dg Chest Portable 1 View In Am  01/04/2013  *RADIOLOGY REPORT*  Clinical Data: Postop day #1 status post CABG  PORTABLE CHEST - 1 VIEW  Comparison: 01/03/2013  Findings: Endotracheal and nasogastric tubes have been removed. Support apparatus otherwise stable.  Low lung volumes are present, causing crowding of the pulmonary vasculature.  Prior small right pneumothorax is no longer readily apparent.  No left pneumothorax.  Persistent left and mildly increased right basilar airspace opacity, likely attributable to atelectasis. Upper normal heart size.  IMPRESSION:  1.  Low lung volumes. 2.  Mildly increased right basilar atelectasis; stable  left basilar atelectasis. 3.  Prior right sided pneumothorax is no longer readily apparent.   Original Report Authenticated By: Gaylyn Rong, M.D.    Dg Chest Portable 1 View  01/03/2013  *RADIOLOGY REPORT*  Clinical Data: Post CABG  PORTABLE CHEST - 1 VIEW  Comparison: Portable exam 1316 hours compared to 12/31/2012  Findings: Tip of endotracheal tube 4.6 cm above carina. Nasogastric tube extends into stomach. Mediastinal drains and bilateral thoracostomy tubes noted. Right jugular Swan-Ganz catheter tip projecting over proximal right pulmonary artery. Interval median sternotomy and  cardiac surgery. Plates of atelectasis identified at left upper lobe and at the right base. Atelectasis left lower lobe. Probable small right apex pneumothorax. No acute osseous findings.  IMPRESSION: Satisfactory line and tube positions as above. Scattered atelectasis in both lungs. Probable small right apex pneumothorax in patient with bilateral thoracostomy tubes.   Original Report Authenticated By: Ulyses Southward, M.D.       Assessment/Plan:   Principal Problem:  *S/P CABG x 2, (LIMA-LAD and RIMA-OM) 01/03/13 Active Problems:  NSTEMI- S/P cath 1/7- severe 2 vessel dz; EF 55%  Smoker  Hypercholesterolemia  Doing well. Atelectasis. Sinus tachycardia. Beta blocker increased yesterday. Ambulating. ?transfer to 2000 today.    Lennette Bihari, MD, [facility] 01/05/2013, 9:04 AM

## 2013-01-06 ENCOUNTER — Inpatient Hospital Stay (HOSPITAL_COMMUNITY): Payer: Medicaid Other

## 2013-01-06 LAB — CBC
MCH: 32.2 pg (ref 26.0–34.0)
MCV: 94.6 fL (ref 78.0–100.0)
Platelets: 212 10*3/uL (ref 150–400)
RDW: 12.8 % (ref 11.5–15.5)
WBC: 14.1 10*3/uL — ABNORMAL HIGH (ref 4.0–10.5)

## 2013-01-06 LAB — BASIC METABOLIC PANEL
Calcium: 8.7 mg/dL (ref 8.4–10.5)
Creatinine, Ser: 0.55 mg/dL (ref 0.50–1.35)
GFR calc Af Amer: >90 mL/min (ref >90)
Sodium: 131 mEq/L — ABNORMAL LOW (ref 135–145)

## 2013-01-06 MED ORDER — LISINOPRIL 5 MG PO TABS
5.0000 mg | ORAL_TABLET | Freq: Every day | ORAL | Status: DC
  Administered 2013-01-06 – 2013-01-08 (×3): 5 mg via ORAL
  Filled 2013-01-06 (×3): qty 1

## 2013-01-06 MED ORDER — OXYCODONE HCL 5 MG PO TABS: 5.0000 mg | ORAL_TABLET | ORAL | Status: DC | PRN

## 2013-01-06 MED ORDER — IPRATROPIUM-ALBUTEROL 18-103 MCG/ACT IN AERO: 2.0000 | INHALATION_SPRAY | Freq: Four times a day (QID) | RESPIRATORY_TRACT | Status: DC | PRN

## 2013-01-06 MED ORDER — DOXYCYCLINE HYCLATE 100 MG PO TABS: 100.0000 mg | ORAL_TABLET | Freq: Two times a day (BID) | ORAL | Status: DC

## 2013-01-06 MED ORDER — LISINOPRIL 5 MG PO TABS: 5.0000 mg | ORAL_TABLET | Freq: Every day | ORAL | Status: DC

## 2013-01-06 MED ORDER — ASPIRIN 325 MG PO TBEC: 325.0000 mg | DELAYED_RELEASE_TABLET | Freq: Every day | ORAL | Status: DC

## 2013-01-06 MED ORDER — POTASSIUM CHLORIDE CRYS ER 20 MEQ PO TBCR
40.0000 meq | EXTENDED_RELEASE_TABLET | Freq: Once | ORAL | Status: AC
  Administered 2013-01-06: 40 meq via ORAL
  Filled 2013-01-06: qty 2

## 2013-01-06 MED ORDER — GUAIFENESIN ER 600 MG PO TB12
1200.0000 mg | ORAL_TABLET | Freq: Two times a day (BID) | ORAL | Status: DC
  Filled 2013-01-06 (×2): qty 2

## 2013-01-06 MED ORDER — METOPROLOL TARTRATE 50 MG PO TABS: 50.0000 mg | ORAL_TABLET | Freq: Two times a day (BID) | ORAL | Status: DC

## 2013-01-06 MED ORDER — TRAMADOL HCL 50 MG PO TABS: 50.0000 mg | ORAL_TABLET | Freq: Four times a day (QID) | ORAL | Status: DC | PRN

## 2013-01-06 MED ORDER — ATORVASTATIN CALCIUM 40 MG PO TABS: 40.0000 mg | ORAL_TABLET | Freq: Every day | ORAL | Status: DC

## 2013-01-06 NOTE — Progress Notes (Signed)
[facility] Progress Note  Subjective:  No chest pain; occasional cough  Objective:   Vital Signs in the last 24 hours: Temp:  [97.6 F (36.4 C)-98.7 F (37.1 C)] 98.7 F (37.1 C) (01/12 0415) Pulse Rate:  [43-112] 109  (01/12 0415) Resp:  [20-32] 22  (01/12 0415) BP: (117-151)/(67-99) 117/72 mmHg (01/12 0415) SpO2:  [88 %-100 %] 97 % (01/12 0415) Weight:  [106.731 kg (235 lb 4.8 oz)] 106.731 kg (235 lb 4.8 oz) (01/12 0415)  Intake/Output from previous day: 01/11 0701 - 01/12 0700 In: 520 [P.O.:450; I.V.:20; IV Piggyback:50] Out: 2175 [Urine:2175]  Scheduled:   . acetaminophen  1,000 mg Oral Q6H  . aspirin EC  325 mg Oral Daily  . atorvastatin  40 mg Oral q1800  . bisacodyl  10 mg Oral Daily   Or  . bisacodyl  10 mg Rectal Daily  . cefUROXime (ZINACEF)  IV  1.5 g Intravenous Q12H  . cefUROXime (ZINACEF)  IV  1.5 g Intravenous Once  . docusate sodium  200 mg Oral Daily  . doxycycline  100 mg Oral Q12H  . folic acid  1 mg Oral Daily  . metoprolol tartrate  50 mg Oral BID  . pantoprazole  40 mg Oral Daily  . sodium chloride  3 mL Intravenous Q12H  . thiamine  100 mg Oral Daily    Physical Exam:   General appearance: alert, cooperative and no distress Neck: no JVD Lungs: decreased BS at bases Heart: S1, S2 normal, intermittent rub Abdomen: soft, non-tender; bowel sounds normal; no masses,  no organomegaly Extremities: no edema, redness or tenderness in the calves or thighs   Rate: 110  Rhythm: sinus tachycardia  Lab Results:    Basename 01/05/13 0326 01/04/13 1607 01/04/13 0415  NA 131* 135 --  K 3.9 4.0 --  CL 94* 99 --  CO2 25 -- 21  GLUCOSE 122* 114* --  BUN 13 13 --  CREATININE 0.75 0.80 --   No results found for this basename: TROPONINI:2,CK,MB:2 in the last 72 hours Hepatic Function Panel No results found for this basename: PROT,ALBUMIN,AST,ALT,ALKPHOS,BILITOT,BILIDIR,IBILI in the last 72 hours  Basename 01/03/13  1316  INR 1.17   CBC    Component Value Date/Time   WBC 20.9* 01/05/2013 0326   RBC 4.12* 01/05/2013 0326   HGB 13.4 01/05/2013 0326   HCT 39.3 01/05/2013 0326   PLT 208 01/05/2013 0326   MCV 95.4 01/05/2013 0326   MCH 32.5 01/05/2013 0326   MCHC 34.1 01/05/2013 0326   RDW 12.7 01/05/2013 0326   LYMPHSABS 2.6 01/01/2013 0010   MONOABS 0.7 01/01/2013 0010   EOSABS 0.2 01/01/2013 0010   BASOSABS 0.0 01/01/2013 0010     Lipid Panel     Component Value Date/Time   CHOL 281* 01/01/2013 0500   TRIG 230* 01/01/2013 0500   HDL 36* 01/01/2013 0500   CHOLHDL 7.8 01/01/2013 0500   VLDL 46* 01/01/2013 0500   LDLCALC 199* 01/01/2013 0500     Imaging:  Dg Chest Portable 1 View In Am  01/05/2013  *RADIOLOGY REPORT*  Clinical Data: Postop CABG.  PORTABLE CHEST - 1 VIEW  Comparison: 01/04/2013  Findings: Interval removal of bilateral chest tubes and Swan-Ganz catheter.  No pneumothorax.  Low lung volumes with bibasilar atelectasis.  Mild cardiomegaly.  Vascular congestion.  No real change since prior study.  IMPRESSION: Interval removal of bilateral chest tubes without pneumothorax.  Continued low lung volumes with bibasilar atelectasis and  vascular congestion.   Original Report Authenticated By: Charlett Nose, M.D.       Assessment/Plan:   Principal Problem:  *S/P CABG x 2, (LIMA-LAD and RIMA-OM) 01/03/13 Active Problems:  NSTEMI- S/P cath 1/7- severe 2 vessel dz; EF 55%  Smoker  Hypercholesterolemia   Day 3 s/p CABG. No chest pain. Atelectasis. WBC 20.9 on zinacef. Will begin low dose ACE-I with lisinopril at 2.5 mg. Cardiac rehab. Pulmonary toilet with incentive spirometry.   Lennette Bihari, MD, [facility] 01/06/2013, 7:00 AM

## 2013-01-06 NOTE — Progress Notes (Signed)
3 Days Post-Op Procedure(s) (LRB): CORONARY ARTERY BYPASS GRAFTING (CABG) (N/A) INTRAOPERATIVE TRANSESOPHAGEAL ECHOCARDIOGRAM (N/A) Subjective:  Patient complains of pain this morning and inability to cough up sputum.  He also states he did not get much rest last night. No BM Objective: Vital signs in last 24 hours: Temp:  [97.9 F (36.6 C)-98.7 F (37.1 C)] 98.7 F (37.1 C) (01/12 0415) Pulse Rate:  [43-112] 109  (01/12 0415) Cardiac Rhythm:  [-] Sinus tachycardia (01/12 0730) Resp:  [20-32] 22  (01/12 0415) BP: (117-151)/(67-99) 117/72 mmHg (01/12 0415) SpO2:  [88 %-100 %] 97 % (01/12 0415) Weight:  [235 lb 4.8 oz (106.731 kg)] 235 lb 4.8 oz (106.731 kg) (01/12 0415)  Intake/Output from previous day: 01/11 0701 - 01/12 0700 In: 520 [P.O.:450; I.V.:20; IV Piggyback:50] Out: 2175 [Urine:2175]  General appearance: alert, cooperative and no distress Heart: regular rate and rhythm Lungs: diminished breath sounds bibasilar Abdomen: soft, non-tender; bowel sounds normal; no masses,  no organomegaly Extremities: edema trace Wound: clean and dry  Lab Results:  Basename 01/06/13 0705 01/05/13 0326  WBC 14.1* 20.9*  HGB 11.9* 13.4  HCT 35.0* 39.3  PLT 212 208   BMET:  Basename 01/06/13 0705 01/05/13 0326  NA 131* 131*  K 3.6 3.9  CL 96 94*  CO2 22 25  GLUCOSE 114* 122*  BUN 12 13  CREATININE 0.55 0.75  CALCIUM 8.7 8.6    PT/INR:  Basename 01/03/13 1316  LABPROT 14.7  INR 1.17   ABG    Component Value Date/Time   PHART 7.349* 01/03/2013 1857   HCO3 21.6 01/03/2013 1857   TCO2 25 01/04/2013 1607   ACIDBASEDEF 4.0* 01/03/2013 1857   O2SAT 97.0 01/03/2013 1857   CBG (last 3)   Basename 01/05/13 1640 01/05/13 1145 01/05/13 0757  GLUCAP 128* 123* 113*    Assessment/Plan: S/P Procedure(s) (LRB): CORONARY ARTERY BYPASS GRAFTING (CABG) (N/A) INTRAOPERATIVE TRANSESOPHAGEAL ECHOCARDIOGRAM (N/A)  1. CV- NSR- on Lopressor, will start low dose ACE per Cardiology  recommendations 2. Pulm- atelectasis improving, will order Mucinex to help with cough, continue inhalers 3. Expected post operative anemia- mild 4. Volume Overload- continue diuresis 5. Dispo- will d/c EPW this morning, add Mucinex- if no issues possible d/c in 24-48 hours   LOS: 6 days    Raford Pitcher, Denny Peon 01/06/2013

## 2013-01-06 NOTE — Progress Notes (Signed)
Pt ambulated in hallway 550 ft with wheelchair. Pt tolerated activity well. Will continue to monitor.

## 2013-01-06 NOTE — Progress Notes (Signed)
EPWs d/ced per MD order and protocol. Wire ends intact. Pt tolerated procedure well. Pt resting in bed X 1 hour. Will continue to monitor.

## 2013-01-06 NOTE — Discharge Summary (Signed)
Physician Discharge Summary  Patient ID: Juan Gates MRN: [id number] DOB/AGE: 1956-11-11 57 y.o.  Admit date: 12/31/2012 Discharge date: 01/06/2013  Admission Diagnoses:  Patient Active Problem List  Diagnosis  . NSTEMI- S/P cath 1/7- severe 2 vessel dz; EF 55%  . Smoker  . Hypercholesterolemia    Discharge Diagnoses:   Patient Active Problem List  Diagnosis  . NSTEMI- S/P cath 1/7- severe 2 vessel dz; EF 55%  . Smoker  . Hypercholesterolemia  . 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  He presented to [facility] ED on 12/31/2012 with a complaint of sudden substernal chest pain with radiation down both arms.  This occurred at rest.  After presentation chest pain resolved within 20 minutes.  EKG obtained showed non- specific EKG changes, but cardiac enzymes were positive ruling patient in for acute NSTEMI.  He was admitted to Cardiology service and underwent cardiac catheterization.  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  It was felt the patient would best be treated with coronary revascularization via bypass surgery.  TCTS was consulted and the patient was evaluated by Dr. Cornelius Moras who felt the patient would be a candidate for bypass surgery, risks and benefits were explained to the patient and he was agreeable to proceed.    Hospital Course:   The patient remained chest pain free during hospitalization.  He was taken to the operating room on 01/03/2013 at which time he underwent CABG x 2 utilizing RIMA to OM1 and LIMA to LAD.  The patient tolerated the procedure well and was taken to the SICU in stable condition.  The patient was extubated the evening of surgery without difficulty.  During the ICU stay the patient's chest tubes and arterial lines were removed without difficulty.  The patient developed a  productive cough which most likely was bronchitis.  He was placed on Doxycycline.  Once medically stable he was transferred to the telemetry unit for further care.  The patient has done well.  He continues to have issues with a productive cough for which he was given Mucinex.  The cough is not surprising due to Juan Gates.  He is maintaining NSR and his external pacing wires have been removed.  Should he continue to make progress, I anticipate he will be ready for discharge in the next 24-48 hours.  He will follow up with Dr. Cornelius Moras in 3 weeks with a chest xray prior to his appointment.  He will also need to follow up with Dr. Tresa Endo in 2-4 weeks.        Consults: cardiology  Significant Diagnostic Studies:  Hemodynamics:  Central Aortic / Mean Pressures: 109/72 mmHg; 90 mmHg  Left Ventricular Pressures / EDP: 101/9 mmHg; 15 mmHg Left Ventriculography:  EF: 55 %  Wall Motion: No regional abnormalities noted Coronary Anatomy:  Left Main: Large-Caliber Vessel Bifurcates into LAD and Circumflex LAD: Large-caliber vessel it tapers into this long tubular 60-70% stenosis in the proximal portion of the vessel just after a very proximal diagonal branch extending into the mid vessel. In there is a short relatively normal segment where a large septal trunk and a small diagonal branch come off. Essentially bifurcating diagonal the vessel then tapers back into a long 60-70% stenosis then normalizes in the mid vessel with diffuse luminal  irregularities as it courses down around the apex. There are several small diagonal branches distally.  D1: A very proximal branch vessel ostial/proximal 70% and a smaller moderate caliber vessel. It does cover large portion of the anterolateral wall  D2: Small-caliber very early bifurcating vessel that comes off at the same takeoff as the SP1 in the short intervening normal segment. Left Circumflex: Large-caliber vessel with a small atrial branch followed by a short  hazy filling area then grossly normal area of before a focal 90% eccentric hazy lesion it is likely culprit for his non-STEMI. This is just before the vessel bifurcates into a large lateral OM and the AV groove circumflex. The follow on the AV circumflex lysis small caliber it gives off rise to 2 very small left posterior lateral branches.  Lateral OM: Large-caliber vessel with mild luminal irregularities it gives rise to a small OM branch followed by bifurcation into 2 larger OM 2 and 3 branches. RCA: Large-caliber vessel, dominant with a long tubular 56% mid vessel lesion. There is RV marginal that has a 99% occlusion just before by bifurcation. This is a small-caliber vessel. The vessel bifurcates into a smaller moderate caliber PDA and posterior lateral system with 2 posterior lateral branches. There is no there's larger R. marginal branch in the more distal portion of the RCA.   Treatments: surgery:   Coronary Artery Bypass Grafting x 2  Left Internal Mammary Artery to Distal Left Anterior Descending Coronary Artery  Right Internal Mammary Artery to Obtuse Marginal Branch of Left Circumflex Coronary Artery  Disposition: Home  The patient has been discharged on:   1.Beta Blocker:  Yes [  x ]                              No   [   ]                              If No, reason:  2.Ace Inhibitor/ARB: Yes [ x  ]                                     No  [    ]                                     If No, reason:  3.Statin:   Yes [ x  ]                  No  [   ]                  If No, reason:  4.Ecasa:  Yes  [ x  ]                  No   [   ]                  If No, reason:       Medication List     As of 01/06/2013 12:30 PM    TAKE these medications         albuterol-ipratropium 18-103 MCG/ACT inhaler   Commonly known as: COMBIVENT   Inhale 2 puffs into the lungs every 6 (  six) hours as needed for wheezing.      aspirin 325 MG EC tablet   Take 1 tablet (325 mg total) by mouth  daily.      atorvastatin 40 MG tablet   Commonly known as: LIPITOR   Take 1 tablet (40 mg total) by mouth daily at 6 PM.      diphenhydrAMINE 25 mg capsule   Commonly known as: BENADRYL   Take 25-50 mg by mouth at bedtime.      doxycycline 100 MG tablet   Commonly known as: VIBRA-TABS   Take 1 tablet (100 mg total) by mouth every 12 (twelve) hours. For 5 days      lisinopril 5 MG tablet   Commonly known as: PRINIVIL,ZESTRIL   Take 1 tablet (5 mg total) by mouth daily.      metoprolol 50 MG tablet   Commonly known as: LOPRESSOR   Take 1 tablet (50 mg total) by mouth 2 (two) times daily.      oxyCODONE 5 MG immediate release tablet   Commonly known as: Oxy IR/ROXICODONE   Take 1-2 tablets (5-10 mg total) by mouth every 4 (four) hours as needed.      traMADol 50 MG tablet   Commonly known as: ULTRAM   Take 1-2 tablets (50-100 mg total) by mouth every 6 (six) hours as needed.         SignedLowella Dandy 01/06/2013, 12:30 PM

## 2013-01-07 MED ORDER — METOPROLOL TARTRATE 50 MG PO TABS
75.0000 mg | ORAL_TABLET | Freq: Two times a day (BID) | ORAL | Status: DC
  Administered 2013-01-07 – 2013-01-08 (×3): 75 mg via ORAL
  Filled 2013-01-07 (×4): qty 1

## 2013-01-07 NOTE — Progress Notes (Signed)
[facility]  Subjective: Doing well.  No SOB.   Objective: Vital signs in last 24 hours: Temp:  [97.5 F (36.4 C)-98.2 F (36.8 C)] 97.5 F (36.4 C) (01/13 0421) Pulse Rate:  [101-110] 105  (01/13 0421) Resp:  [20] 20  (01/13 0421) BP: (119-131)/(70-79) 126/72 mmHg (01/13 0421) SpO2:  [92 %-96 %] 94 % (01/13 0421) Weight:  [116.529 kg (256 lb 14.4 oz)] 116.529 kg (256 lb 14.4 oz) (01/13 0421) Last BM Date: 01/07/13  Intake/Output from previous day: 01/12 0701 - 01/13 0700 In: 960 [P.O.:960] Out: 301 [Urine:300; Stool:1] Intake/Output this shift:    Medications Current Facility-Administered Medications  Medication Dose Route Frequency Provider Last Rate Last Dose  . 0.9 %  sodium chloride infusion  250 mL Intravenous PRN Purcell Nails, MD      . acetaminophen (TYLENOL) tablet 1,000 mg  1,000 mg Oral Q6H Erin Barrett, PA   1,000 mg at 01/07/13 0608  . albuterol-ipratropium (COMBIVENT) inhaler 2 puff  2 puff Inhalation Q6H PRN Purcell Nails, MD      . aspirin EC tablet 325 mg  325 mg Oral Daily Erin Barrett, PA   325 mg at 01/06/13 1208  . atorvastatin (LIPITOR) tablet 40 mg  40 mg Oral q1800 Lennette Bihari, MD   40 mg at 01/06/13 1738  . bisacodyl (DULCOLAX) EC tablet 10 mg  10 mg Oral Daily Erin Barrett, PA   10 mg at 01/05/13 1020   Or  . bisacodyl (DULCOLAX) suppository 10 mg  10 mg Rectal Daily Erin Barrett, PA      . docusate sodium (COLACE) capsule 200 mg  200 mg Oral Daily Erin Barrett, PA   200 mg at 01/06/13 1209  . doxycycline (VIBRA-TABS) tablet 100 mg  100 mg Oral Q12H Purcell Nails, MD   100 mg at 01/06/13 2126  . folic acid (FOLVITE) tablet 1 mg  1 mg Oral Daily Runell Gess, MD   1 mg at 01/06/13 1209  . guaiFENesin (MUCINEX) 12 hr tablet 600 mg  600 mg Oral Q12H PRN Purcell Nails, MD   600 mg at 01/05/13 1021  . lisinopril (PRINIVIL,ZESTRIL) tablet 5 mg  5 mg Oral Daily Erin Barrett, PA   5 mg at 01/06/13 1213  .  LORazepam (ATIVAN) tablet 1 mg  1 mg Oral Q6H PRN Purcell Nails, MD   1 mg at 01/06/13 1742  . metoprolol tartrate (LOPRESSOR) tablet 75 mg  75 mg Oral BID Purcell Nails, MD      . ondansetron [facility]) injection 4 mg  4 mg Intravenous Q6H PRN Erin Barrett, PA   4 mg at 01/04/13 1836  . oxyCODONE (Oxy IR/ROXICODONE) immediate release tablet 5-10 mg  5-10 mg Oral Q3H PRN Erin Barrett, PA   10 mg at 01/07/13 0427  . pantoprazole (PROTONIX) EC tablet 40 mg  40 mg Oral Daily Erin Barrett, PA   40 mg at 01/06/13 1212  . sodium chloride 0.9 % injection 3 mL  3 mL Intravenous Q12H Purcell Nails, MD   3 mL at 01/06/13 2130  . sodium chloride 0.9 % injection 3 mL  3 mL Intravenous PRN Purcell Nails, MD      . thiamine (VITAMIN B-1) tablet 100 mg  100 mg Oral Daily Runell Gess, MD   100 mg at 01/06/13 1209  . traMADol (ULTRAM) tablet 50-100 mg  50-100 mg Oral Q4H PRN Salvatore Decent  Cornelius Moras, MD   100 mg at 01/06/13 1739    PE: General appearance: alert, cooperative and no distress Lungs: clear to auscultation bilaterally Heart: Reg rhythm, Rate mildly elevated.   Extremities: No LEE Pulses: 2+ and symmetric Decreased pedal pulses.  Feet a little cool. Neurologic: Grossly normal  Lab Results:   Basename 01/06/13 0705 01/05/13 0326 01/04/13 1607 01/04/13 1600  WBC 14.1* 20.9* -- 19.8*  HGB 11.9* 13.4 13.9 --  HCT 35.0* 39.3 41.0 --  PLT 212 208 -- 221   BMET  Basename 01/06/13 0705 01/05/13 0326 01/04/13 1607  NA 131* 131* 135  K 3.6 3.9 4.0  CL 96 94* 99  CO2 22 25 --  GLUCOSE 114* 122* 114*  BUN 12 13 13   CREATININE 0.55 0.75 0.80  CALCIUM 8.7 8.6 --    Assessment/Plan  Principal Problem:  *S/P CABG x 2, (LIMA-LAD and RIMA-OM) 01/03/13 Active Problems:  NSTEMI- S/P cath 1/7- severe 2 vessel dz; EF 55%  Hypercholesterolemia  Smoker  Plan:  POD# 4, CABG x 2 Left Internal Mammary Artery to Distal Left Anterior Descending Coronary Artery, right Internal Mammary Artery to Obtuse  Marginal Branch of Left Circumflex Coronary Artery.   Maintaining NSR/ST on telemetry.   BP Stable.  WBCs down.  ASA, Lipitor, lisinopril, lopressor 75 bid.      LOS: 7 days    HAGER, BRYAN 01/07/2013 9:10 AM    Patient seen and examined. Agree with assessment and plan. Continues to improve daily. Lopressor increased to 75 mg bid today, may ultimately need further titration. Tolerating ACE-I.  Cardiac Rehab as outpatient. Possible DC tomorrow.   Lennette Bihari, MD, [facility] 01/07/2013 9:29 AM

## 2013-01-07 NOTE — Progress Notes (Signed)
CARDIAC REHAB PHASE I   PRE:  Rate/Rhythm: 107 ST    BP: sitting 132/92    SaO2: 95 RA  MODE:  Ambulation: 550 ft   POST:  Rate/Rhythm: 114 ST    BP: sitting 135/74     SaO2: 95 RA  Pt steady with rw. D/c'd o2, did not need for walk. Likes using rw but sts he will not need for d/c. Will eval more later. Pt DOE but sts he feels good. Doing about 750 cc on IS. Sternal incision draining. Progressing well, will f/u. [id number]  Harriet Masson CES, ACSM

## 2013-01-07 NOTE — Progress Notes (Signed)
Pt ambulated approx. 300 ft to new room (2009) with RN on RA.  Steady gait and pace, no SOB, tolerated well.  Will continue to monitor closely. Ave Filter

## 2013-01-07 NOTE — Progress Notes (Addendum)
[address]            [place] [address]          [phone]     4 Days Post-Op  Procedure(s) (LRB): CORONARY ARTERY BYPASS GRAFTING (CABG) (N/A) INTRAOPERATIVE TRANSESOPHAGEAL ECHOCARDIOGRAM (N/A) Subjective: Feels like he is breathing much better  Objective  Telemetry sinus tachy  Temp:  [97.5 F (36.4 C)-98.2 F (36.8 C)] 97.5 F (36.4 C) (01/13 0421) Pulse Rate:  [101-110] 105  (01/13 0421) Resp:  [20] 20  (01/13 0421) BP: (119-131)/(70-79) 126/72 mmHg (01/13 0421) SpO2:  [92 %-96 %] 94 % (01/13 0421) Weight:  [256 lb 14.4 oz (116.529 kg)] 256 lb 14.4 oz (116.529 kg) (01/13 0421)   Intake/Output Summary (Last 24 hours) at 01/07/13 0801 Last data filed at 01/07/13 0300  Gross per 24 hour  Intake    480 ml  Output    301 ml  Net    179 ml       General appearance: alert, cooperative and no distress Heart: regular rate and rhythm and tachy Lungs: mildly dim in bases, no wheeze Abdomen: soft, nontender Extremities: min edema Wound: incis healing well  Lab Results:  Basename 01/06/13 0705 01/05/13 0326 01/04/13 1600  NA 131* 131* --  K 3.6 3.9 --  CL 96 94* --  CO2 22 25 --  GLUCOSE 114* 122* --  BUN 12 13 --  CREATININE 0.55 0.75 --  CALCIUM 8.7 8.6 --  MG -- -- 2.1  PHOS -- -- --   No results found for this basename: AST:2,ALT:2,ALKPHOS:2,BILITOT:2,PROT:2,ALBUMIN:2 in the last 72 hours No results found for this basename: LIPASE:2,AMYLASE:2 in the last 72 hours  Basename 01/06/13 0705 01/05/13 0326  WBC 14.1* 20.9*  NEUTROABS -- --  HGB 11.9* 13.4  HCT 35.0* 39.3  MCV 94.6 95.4  PLT 212 208   No results found for this basename: CKTOTAL:4,CKMB:4,TROPONINI:4 in the last 72 hours No components found with this basename: POCBNP:3 No results found for this basename: DDIMER in the last 72 hours No results found for this basename: HGBA1C in the last 72 hours No results found for this basename:  CHOL,HDL,LDLCALC,TRIG,CHOLHDL in the last 72 hours No results found for this basename: TSH,T4TOTAL,FREET3,T3FREE,THYROIDAB in the last 72 hours No results found for this basename: VITAMINB12,FOLATE,FERRITIN,TIBC,IRON,RETICCTPCT in the last 72 hours  Medications: Scheduled    . acetaminophen  1,000 mg Oral Q6H  . aspirin EC  325 mg Oral Daily  . atorvastatin  40 mg Oral q1800  . bisacodyl  10 mg Oral Daily   Or  . bisacodyl  10 mg Rectal Daily  . docusate sodium  200 mg Oral Daily  . doxycycline  100 mg Oral Q12H  . folic acid  1 mg Oral Daily  . lisinopril  5 mg Oral Daily  . metoprolol tartrate  50 mg Oral BID  . pantoprazole  40 mg Oral Daily  . sodium chloride  3 mL Intravenous Q12H  . thiamine  100 mg Oral Daily     Radiology/Studies:  Dg Chest 2 View  01/06/2013  *RADIOLOGY REPORT*  Clinical Data: Status post CABG.  Shortness of breath.  CHEST - 2 VIEW  Comparison: A chest x-ray 01/05/2013.  Findings: Previously noted right IJ catheter has been removed. Lung volumes remain low and there continues to be extensive bibasilar opacities favored to predominately reflect some postoperative  atelectasis.  Small bilateral pleural effusions persist.  No pneumothorax.  No evidence of pulmonary edema.  Heart size is upper limits of normal.  Mediastinal contours are within normal limits.  Status post median sternotomy. Epicardial pacing wires remain in place.  Atherosclerotic calcifications within the arch of the aorta.  IMPRESSION: 1.  Support apparatus and postoperative changes, as above. 2.  Persistent low lung volumes with persistent bibasilar opacities favored to reflect resolving subsegmental atelectasis.  Underlying air space consolidation is difficult to entirely exclude, and continued attention on follow-up studies is recommended. 3.  Atherosclerosis.   Original Report Authenticated By: Trudie Reed, M.D.     INR: Will add last result for INR, ABG once components are confirmed Will  add last 4 CBG results once components are confirmed  Assessment/Plan: S/P Procedure(s) (LRB): CORONARY ARTERY BYPASS GRAFTING (CABG) (N/A) INTRAOPERATIVE TRANSESOPHAGEAL ECHOCARDIOGRAM (N/A)  1 good progress especially pulm status- cont rx 2 may consider increase lopressor, or poss change to coreg 3 push rehab as able   LOS: 7 days    Juan Gates,Juan Gates 1/13/20148:01 AM    I have seen and examined the patient and agree with the assessment and plan as outlined.  Increase lopressor.    Imari Sivertsen H 01/07/2013 9:10 AM

## 2013-01-08 MED ORDER — METOPROLOL TARTRATE 25 MG PO TABS: 75.0000 mg | ORAL_TABLET | Freq: Two times a day (BID) | ORAL | Status: DC

## 2013-01-08 NOTE — Progress Notes (Signed)
[id number] Education completed with pt. Understanding voiced. Discussed smoking cessation and handouts given. Pt going to try cold [place]. Discussed CRP 2 and permission given to refer to [facility] Phase 2. Put on discharge video for pt to view. Luetta Nutting RN

## 2013-01-08 NOTE — Progress Notes (Signed)
[address]            [place] [address]          [phone]     5 Days Post-Op  Procedure(s) (LRB): CORONARY ARTERY BYPASS GRAFTING (CABG) (N/A) INTRAOPERATIVE TRANSESOPHAGEAL ECHOCARDIOGRAM (N/A) Subjective: conts to feel well, breathng comfortably  Objective  Telemetry sinus rhythm/tach  Temp:  [97.6 F (36.4 C)-98.6 F (37 C)] 97.6 F (36.4 C) (01/14 0428) Pulse Rate:  [95-105] 103  (01/14 0428) Resp:  [18] 18  (01/14 0428) BP: (113-135)/(60-74) 114/60 mmHg (01/14 0428) SpO2:  [95 %-98 %] 96 % (01/14 0428) Weight:  [236 lb 15.9 oz (107.5 kg)] 236 lb 15.9 oz (107.5 kg) (01/14 0423)   Intake/Output Summary (Last 24 hours) at 01/08/13 0753 Last data filed at 01/07/13 1900  Gross per 24 hour  Intake   1200 ml  Output      1 ml  Net   1199 ml       General appearance: alert, cooperative and no distress Heart: regular rate and rhythm Lungs: clear to auscultation bilaterally Abdomen: soft, non-tender; bowel sounds normal; no masses,  no organomegaly Extremities: no edema Wound: incisions healing well  Lab Results:  Basename 01/06/13 0705  NA 131*  K 3.6  CL 96  CO2 22  GLUCOSE 114*  BUN 12  CREATININE 0.55  CALCIUM 8.7  MG --  PHOS --   No results found for this basename: AST:2,ALT:2,ALKPHOS:2,BILITOT:2,PROT:2,ALBUMIN:2 in the last 72 hours No results found for this basename: LIPASE:2,AMYLASE:2 in the last 72 hours  Basename 01/06/13 0705  WBC 14.1*  NEUTROABS --  HGB 11.9*  HCT 35.0*  MCV 94.6  PLT 212   No results found for this basename: CKTOTAL:4,CKMB:4,TROPONINI:4 in the last 72 hours No components found with this basename: POCBNP:3 No results found for this basename: DDIMER in the last 72 hours No results found for this basename: HGBA1C in the last 72 hours No results found for this basename: CHOL,HDL,LDLCALC,TRIG,CHOLHDL in the last 72 hours No results found for this basename:  TSH,T4TOTAL,FREET3,T3FREE,THYROIDAB in the last 72 hours No results found for this basename: VITAMINB12,FOLATE,FERRITIN,TIBC,IRON,RETICCTPCT in the last 72 hours  Medications: Scheduled    . acetaminophen  1,000 mg Oral Q6H  . aspirin EC  325 mg Oral Daily  . atorvastatin  40 mg Oral q1800  . bisacodyl  10 mg Oral Daily   Or  . bisacodyl  10 mg Rectal Daily  . docusate sodium  200 mg Oral Daily  . doxycycline  100 mg Oral Q12H  . folic acid  1 mg Oral Daily  . lisinopril  5 mg Oral Daily  . metoprolol tartrate  75 mg Oral BID  . pantoprazole  40 mg Oral Daily  . sodium chloride  3 mL Intravenous Q12H  . thiamine  100 mg Oral Daily     Radiology/Studies:  No results found.  INR: Will add last result for INR, ABG once components are confirmed Will add last 4 CBG results once components are confirmed  Assessment/Plan: S/P Procedure(s) (LRB): CORONARY ARTERY BYPASS GRAFTING (CABG) (N/A) INTRAOPERATIVE TRANSESOPHAGEAL ECHOCARDIOGRAM (N/A) Plan for discharge: see discharge orders  Current meds   Medication List     As of 01/08/2013  7:58 AM    TAKE these medications         albuterol-ipratropium 18-103 MCG/ACT inhaler   Commonly known as:  COMBIVENT   Inhale 2 puffs into the lungs every 6 (six) hours as needed for wheezing.      aspirin 325 MG EC tablet   Take 1 tablet (325 mg total) by mouth daily.      atorvastatin 40 MG tablet   Commonly known as: LIPITOR   Take 1 tablet (40 mg total) by mouth daily at 6 PM.      diphenhydrAMINE 25 mg capsule   Commonly known as: BENADRYL   Take 25-50 mg by mouth at bedtime.      doxycycline 100 MG tablet   Commonly known as: VIBRA-TABS   Take 1 tablet (100 mg total) by mouth every 12 (twelve) hours. For 5 days      lisinopril 5 MG tablet   Commonly known as: PRINIVIL,ZESTRIL   Take 1 tablet (5 mg total) by mouth daily.      metoprolol tartrate 25 MG tablet   Commonly known as: LOPRESSOR   Take 3 tablets (75 mg total)  by mouth 2 (two) times daily.      oxyCODONE 5 MG immediate release tablet   Commonly known as: Oxy IR/ROXICODONE   Take 1-2 tablets (5-10 mg total) by mouth every 4 (four) hours as needed.      traMADol 50 MG tablet   Commonly known as: ULTRAM   Take 1-2 tablets (50-100 mg total) by mouth every 6 (six) hours as needed.              LOS: 8 days    Wilborn Membreno E 1/14/20147:53 AM

## 2013-01-08 NOTE — Progress Notes (Signed)
CT sutures removed at this time; steristrips applied; pt tolerated well; will cont. To monitor.

## 2013-01-08 NOTE — Progress Notes (Signed)
Nutrition Brief Note  Patient identified on the Malnutrition Screening Tool (MST) Report.  Body mass index is 30.84 kg/(m^2). Patient meets criteria for Obesity Class I based on current BMI.   Current diet order is Heart Healthy, patient is consuming approximately 75-100% of meals at this time. Labs and medications reviewed.   No nutrition interventions warranted at this time. Please consult RD as needed.  Maureen Chatters, RD, LDN Pager #: [phone] After-Hours Pager #: [phone]

## 2013-01-08 NOTE — Progress Notes (Signed)
S/P CABG x 2 POD# 5   Subjective:  No complaints. Denies CP. Reports only mild SOB while ambulating the halls.  Objective:  Vital Signs in the last 24 hours: Temp:  [97.6 F (36.4 C)-98.6 F (37 C)] 97.6 F (36.4 C) (01/14 0428) Pulse Rate:  [95-105] 103  (01/14 0428) Resp:  [18] 18  (01/14 0428) BP: (113-135)/(60-74) 114/60 mmHg (01/14 0428) SpO2:  [95 %-98 %] 96 % (01/14 0428) Weight:  [236 lb 15.9 oz (107.5 kg)] 236 lb 15.9 oz (107.5 kg) (01/14 0423)  Intake/Output from previous day: 01/13 0701 - 01/14 0700 In: 1200 [P.O.:1200] Out: 1 [Urine:1] Intake/Output from this shift:    Physical Exam: General appearance: alert, cooperative and no distress Lungs: clear to auscultation bilaterally Heart: regular rate and rhythm, S1, S2 normal, no murmur, click, rub or gallop Extremities: no LEE Pulses: 2+ and symmetric Skin: warm and dry Neurologic: Grossly normal  Lab Results:  Basename 01/06/13 0705  WBC 14.1*  HGB 11.9*  PLT 212    Basename 01/06/13 0705  NA 131*  K 3.6  CL 96  CO2 22  GLUCOSE 114*  BUN 12  CREATININE 0.55    Imaging: Imaging results have been reviewed  Cardiac Studies:  Assessment/Plan:  Patient Active Hospital Problem List: S/P CABG x 2, (LIMA-LAD and RIMA-OM) 01/03/13 (01/03/2013) NSTEMI- S/P cath 1/7- severe 2 vessel dz; EF 55% (12/31/2012) Smoker (12/31/2012) Hypercholesterolemia (01/02/2013)   Plan: POD# 5, CABG x 2. CP free. Mainatining NSR on telemetry.  BP stable. Exam benign. Pt was seen and examined by CT surgery this AM and they have cleared him for D/C home. Pt will likely need cardiac rehab as an OP. He is on a BB, ACE-I, statin and ASA.    LOS: 8 days    SIMMONS, BRITTAINY 01/08/2013, 7:53 AM

## 2013-01-08 NOTE — Progress Notes (Signed)
Pt. Seen and examined. Agree with the NP/PA-C note as written.  Good medical regimen. Agree he is ready for discharge. Follow-up with Dr. Tresa Endo in the office.  Chrystie Nose, MD, [facility] Attending Cardiologist [facility]

## 2013-01-09 ENCOUNTER — Other Ambulatory Visit: Payer: Self-pay | Admitting: *Deleted

## 2013-01-09 DIAGNOSIS — F419 Anxiety disorder, unspecified: Secondary | ICD-10-CM

## 2013-01-09 MED ORDER — LORAZEPAM 0.5 MG PO TABS: 0.5000 mg | ORAL_TABLET | Freq: Three times a day (TID) | ORAL | Status: DC | PRN

## 2013-01-23 ENCOUNTER — Other Ambulatory Visit: Payer: Self-pay | Admitting: *Deleted

## 2013-01-23 DIAGNOSIS — Z951 Presence of aortocoronary bypass graft: Secondary | ICD-10-CM

## 2013-01-28 ENCOUNTER — Ambulatory Visit
Admission: RE | Admit: 2013-01-28 | Discharge: 2013-01-28 | Disposition: A | Discharge status: Home / Self Care | Payer: Self-pay | Source: Ambulatory Visit | Attending: Thoracic Surgery (Cardiothoracic Vascular Surgery) | Admitting: Thoracic Surgery (Cardiothoracic Vascular Surgery)

## 2013-01-28 ENCOUNTER — Ambulatory Visit (INDEPENDENT_AMBULATORY_CARE_PROVIDER_SITE_OTHER): Payer: Self-pay | Admitting: Thoracic Surgery (Cardiothoracic Vascular Surgery)

## 2013-01-28 ENCOUNTER — Encounter: Payer: Self-pay | Admitting: Thoracic Surgery (Cardiothoracic Vascular Surgery)

## 2013-01-28 VITALS — BP 120/82 | HR 92 | Resp 16 | Ht 72.5 in | Wt 225.0 lb

## 2013-01-28 DIAGNOSIS — I2510 Atherosclerotic heart disease of native coronary artery without angina pectoris: Secondary | ICD-10-CM

## 2013-01-28 DIAGNOSIS — Z951 Presence of aortocoronary bypass graft: Secondary | ICD-10-CM

## 2013-01-28 NOTE — Progress Notes (Signed)
[address]            [place] [address]          [phone]     CARDIOTHORACIC SURGERY OFFICE NOTE  Referring Provider is Lennette Bihari, MD PCP is No primary provider on file.   HPI:  Patient returns for routine followup status post coronary artery bypass grafting x2 on 01/03/2013. His postoperative recovery has been uncomplicated. Since hospital discharge the patient has continued to recover uneventfully. Unfortunately he has already gone back to smoking cigarettes. He reports very mild residual pain in his chest and notes that really the only pains he has of significance are pains in his back and shoulder that seemed to other him only when he is sleeping. As a result he has had some trouble sleeping because he is not comfortable laying flat on his back. He has no shortness of breath. His activity level is very good. He is walking at least twice a day every day. He denies any productive cough.   Current Outpatient Prescriptions  Medication Sig Dispense Refill  . albuterol-ipratropium (COMBIVENT) 18-103 MCG/ACT inhaler Inhale 2 puffs into the lungs every 6 (six) hours as needed for wheezing.  1 Inhaler  0  . aspirin EC 325 MG EC tablet Take 1 tablet (325 mg total) by mouth daily.  30 tablet    . atorvastatin (LIPITOR) 40 MG tablet Take 1 tablet (40 mg total) by mouth daily at 6 PM.  30 tablet  1  . diphenhydrAMINE (BENADRYL) 25 mg capsule Take 25-50 mg by mouth at bedtime.      Marland Kitchen lisinopril (PRINIVIL,ZESTRIL) 5 MG tablet Take 1 tablet (5 mg total) by mouth daily.  30 tablet  1  . LORazepam (ATIVAN) 0.5 MG tablet Take 1 tablet (0.5 mg total) by mouth every 8 (eight) hours as needed for anxiety.  40 tablet  0  . metoprolol tartrate (LOPRESSOR) 25 MG tablet Take 3 tablets (75 mg total) by mouth 2 (two) times daily.  180 tablet  1  . oxyCODONE (OXY IR/ROXICODONE) 5 MG immediate release tablet Take 1-2 tablets (5-10 mg total) by mouth every 4 (four) hours  as needed.  50 tablet  0      Physical Exam:   BP 120/82  Pulse 92  Resp 16  Ht 6' 0.5" (1.842 m)  Wt 225 lb (102.059 kg)  BMI 30.10 kg/m2  SpO2 98%  General:  Well-appearing  Chest:   Clear to auscultation with symmetrical breath sounds  CV:   Regular rate and rhythm without murmur  Incisions:  Clean and dry and healing nicely, sternum is stable  Abdomen:  Lobe soft and nontender  Extremities:  Warm and well-perfused  Diagnostic Tests:  *RADIOLOGY REPORT*  Clinical Data: Shortness of breath. Pain.  CHEST - 2 VIEW  Comparison: 01/06/2013  Findings: No focal consolidation or pulmonary edema. Probable tiny  left pleural effusion. Cardiopericardial silhouette is at upper  limits of normal for size. The patient is status post median  sternotomy. Imaged bony structures of the thorax are intact.  IMPRESSION:  Tiny left pleural effusion. Otherwise no acute findings.  Original Report Authenticated By: Kennith Center, M.D.     Impression:  Patient is doing well 4 weeks status post coronary artery bypass grafting x2.  Plan:  I've reminded the patient had important it is for him to find a  way to quit smoking permanently. I've encouraged patient to enroll in the cardiac rehabilitation program. I've reminded him that he should refrain from any heavy lifting or strenuous use of his arms and shoulders for least another 2-3 months. Otherwise I've encouraged him to increase his physical activity gradually without other significant limitations. All his questions been addressed. In the future he will call and return to see [place] only as needed.   Salvatore Decent. Cornelius Moras, MD 01/28/2013 1:34 PM

## 2013-01-28 NOTE — Patient Instructions (Signed)
The patient should make every effort to stop smoking immediately and permanently.  The patient should continue to avoid any heavy lifting or strenuous use of arms or shoulders for at least a total of three months from the time of surgery.  The patient may return to driving an automobile as long as they are no longer requiring oral narcotic pain relievers during the daytime.  It would be wise to start driving only short distances during the daylight and gradually increase from there as they feel comfortable.

## 2013-02-11 ENCOUNTER — Other Ambulatory Visit: Payer: Self-pay | Admitting: *Deleted

## 2013-02-11 DIAGNOSIS — G8918 Other acute postprocedural pain: Secondary | ICD-10-CM

## 2013-02-11 MED ORDER — HYDROCODONE-ACETAMINOPHEN 5-325 MG PO TABS: 1.0000 | ORAL_TABLET | Freq: Four times a day (QID) | ORAL | Status: DC | PRN

## 2013-05-16 ENCOUNTER — Emergency Department (HOSPITAL_COMMUNITY): Payer: Medicaid Other

## 2013-05-16 ENCOUNTER — Telehealth: Payer: Self-pay | Admitting: Cardiology

## 2013-05-16 ENCOUNTER — Encounter (HOSPITAL_COMMUNITY): Payer: Self-pay | Admitting: Emergency Medicine

## 2013-05-16 ENCOUNTER — Emergency Department (HOSPITAL_COMMUNITY)
Admission: EM | Admit: 2013-05-16 | Discharge: 2013-05-16 | Disposition: A | Discharge status: Home / Self Care | Payer: Medicaid Other | Attending: Emergency Medicine | Admitting: Emergency Medicine

## 2013-05-16 DIAGNOSIS — I2510 Atherosclerotic heart disease of native coronary artery without angina pectoris: Secondary | ICD-10-CM | POA: Insufficient documentation

## 2013-05-16 DIAGNOSIS — IMO0001 Reserved for inherently not codable concepts without codable children: Secondary | ICD-10-CM | POA: Insufficient documentation

## 2013-05-16 DIAGNOSIS — F17200 Nicotine dependence, unspecified, uncomplicated: Secondary | ICD-10-CM | POA: Diagnosis present

## 2013-05-16 DIAGNOSIS — Z951 Presence of aortocoronary bypass graft: Secondary | ICD-10-CM

## 2013-05-16 DIAGNOSIS — Z9889 Other specified postprocedural states: Secondary | ICD-10-CM | POA: Insufficient documentation

## 2013-05-16 DIAGNOSIS — R9431 Abnormal electrocardiogram [ECG] [EKG]: Secondary | ICD-10-CM

## 2013-05-16 DIAGNOSIS — R000 Tachycardia, unspecified: Secondary | ICD-10-CM | POA: Insufficient documentation

## 2013-05-16 DIAGNOSIS — Z79899 Other long term (current) drug therapy: Secondary | ICD-10-CM | POA: Insufficient documentation

## 2013-05-16 DIAGNOSIS — Z7982 Long term (current) use of aspirin: Secondary | ICD-10-CM | POA: Insufficient documentation

## 2013-05-16 DIAGNOSIS — E7800 Pure hypercholesterolemia, unspecified: Secondary | ICD-10-CM | POA: Diagnosis present

## 2013-05-16 DIAGNOSIS — M542 Cervicalgia: Secondary | ICD-10-CM | POA: Diagnosis present

## 2013-05-16 DIAGNOSIS — M25519 Pain in unspecified shoulder: Secondary | ICD-10-CM | POA: Diagnosis present

## 2013-05-16 DIAGNOSIS — M25512 Pain in left shoulder: Secondary | ICD-10-CM

## 2013-05-16 LAB — POCT I-STAT, CHEM 8
Calcium, Ion: 1.15 mmol/L (ref 1.12–1.23)
Chloride: 107 mEq/L (ref 96–112)
Glucose, Bld: 98 mg/dL (ref 70–99)
HCT: 50 % (ref 39.0–52.0)
Hemoglobin: 17 g/dL (ref 13.0–17.0)

## 2013-05-16 LAB — CBC WITH DIFFERENTIAL/PLATELET
Basophils Relative: 0 % (ref 0–1)
Eosinophils Absolute: 0.1 10*3/uL (ref 0.0–0.7)
Eosinophils Relative: 1 % (ref 0–5)
HCT: 47.6 % (ref 39.0–52.0)
Hemoglobin: 16.4 g/dL (ref 13.0–17.0)
MCH: 31.6 pg (ref 26.0–34.0)
MCHC: 34.5 g/dL (ref 30.0–36.0)
Monocytes Absolute: 1.4 10*3/uL — ABNORMAL HIGH (ref 0.1–1.0)
Monocytes Relative: 10 % (ref 3–12)

## 2013-05-16 MED ORDER — NITROGLYCERIN 0.4 MG SL SUBL: 0.4000 mg | SUBLINGUAL_TABLET | SUBLINGUAL | Status: DC | PRN

## 2013-05-16 MED ORDER — ASPIRIN 81 MG PO CHEW: 324.0000 mg | CHEWABLE_TABLET | Freq: Once | ORAL | Status: DC

## 2013-05-16 MED ORDER — MORPHINE SULFATE 4 MG/ML IJ SOLN
4.0000 mg | Freq: Once | INTRAMUSCULAR | Status: AC
  Administered 2013-05-16: 4 mg via INTRAVENOUS
  Filled 2013-05-16: qty 1

## 2013-05-16 MED ORDER — OXYCODONE-ACETAMINOPHEN 5-325 MG PO TABS: 2.0000 | ORAL_TABLET | ORAL | Status: DC | PRN

## 2013-05-16 NOTE — ED Notes (Signed)
Cardiology at bedside.

## 2013-05-16 NOTE — ED Notes (Signed)
Pt c/o of pain. 12 lead EKG repeated and ED-P notified.

## 2013-05-16 NOTE — Telephone Encounter (Signed)
Returned call.  Pt stated he has been up all night for the past 2 nights with pain between his neck and shoulder.  Stated he doesn't know if he pulled a muscle.  He took two IBU today and aleve yesterday.  Stated they did help ease pain off.  Stated he is not having any heart problems.  Denied CP, SOB, radiation of pain, numbness, tingling, HA.  Stated he was helping someone move some things the other day and had to pull it up a hill.  Stated he may have pulled a muscle.  Pt informed symptoms sound like they are muscle related and advised RICE therapy and contact PCP or urgent care for appt today or tomorrow.  Reviewed ER instructions.  Pt verbalized understanding and agreed w/ plan.

## 2013-05-16 NOTE — Telephone Encounter (Signed)
Having pain between neck and shoulder-couldn't sleep last night-felt like a knife was sticking in it! Think he might need to be seen!

## 2013-05-16 NOTE — ED Notes (Signed)
Pt c/o left shoulder pain onset yesterday. Pt reports pulling a trailor yesterday morning. Ibuprofen taken today at 0900. Pt of [facility].

## 2013-05-16 NOTE — ED Provider Notes (Signed)
PT with left side shoulder pain after moving a trailer.  Pain worse with movement. No SOB, nausea, diaphoresis.  EKG shows some minor ST elevation inferiorly and laterally.  No reciprocal changes.  Is changed from prior.  Faxed to interventionalist who is coming to see pt.  Rolan Bucco, MD 05/18/13 [phone]

## 2013-05-16 NOTE — ED Notes (Signed)
NAD noted at time of d/c home. ED-P reviewed d/c inst with pt

## 2013-05-16 NOTE — H&P (Signed)
Juan Gates is an 57 y.o. male.   Chief Complaint:  Neck/shoulder pain HPI:   Patient is a 57 year old Caucasian male with history of coronary disease and coronary bypass grafting times 12/27/2012 with a Rima to the OM the LIMA to the LAD. He also continues to smoke.  Patient states he was reapplying lumbar to a 4 x 8' trailer this past Tuesday was pulling a trailer up a hill when he felt some pain in the left side of his neck/trapezius. He tried ibuprofen which provided some relief.  He presented today to the ER for further evaluation of the pain.  While the ER EKG showed diffuse ST elevation which was different than prior EKG. Patient however has denied chest pain, diaphoresis, shortness of breath, orthopnea, PND, lower extremity edema, nausea, vomiting.    medications:  Prior to Admission medications   Medication Sig Start Date End Date Taking? Authorizing Provider  aspirin EC 325 MG EC tablet Take 1 tablet (325 mg total) by mouth daily. 01/06/13  Yes Erin Barrett, PA-C  Fenofibrate (TRICOR PO) Take 1 tablet by mouth daily.   Yes Historical Provider, MD  lisinopril (PRINIVIL,ZESTRIL) 5 MG tablet Take 1 tablet (5 mg total) by mouth daily. 01/06/13  Yes Erin Barrett, PA-C  metoprolol (LOPRESSOR) 50 MG tablet Take 50 mg by mouth 2 (two) times daily.   Yes Historical Provider, MD  Rosuvastatin Calcium (CRESTOR PO) Take 1 tablet by mouth daily.   Yes Historical Provider, MD    Past Medical History  Diagnosis Date  . 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 Coronary artery bypass graft  01/03/2013    Procedure: CORONARY ARTERY BYPASS GRAFTING (CABG);  Surgeon: Purcell Nails, MD;  Location: [facility] OR;  Service: Open Heart Surgery;  Laterality: N/A;  . Intraoperative transesophageal echocardiogram  01/03/2013    Procedure: INTRAOPERATIVE TRANSESOPHAGEAL ECHOCARDIOGRAM;  Surgeon: Purcell Nails, MD;  Location: [facility] OR;  Service: Open  Heart Surgery;  Laterality: N/A;    Family History  Problem Relation Age of Onset  . Hypertension Mother   . Cancer Father     bone cancer  . Cancer Brother     metastatic melanoma   Social History:  reports that he has been smoking Cigarettes.  He has been smoking about 1.50 packs per day. He does not have any smokeless tobacco history on file. He reports that  drinks alcohol. He reports that he does not use illicit drugs.  Allergies: No Known Allergies   (Not in a hospital admission)  Results for orders placed during the hospital encounter of 05/16/13 (from the past 48 hour(s))  POCT I-STAT TROPONIN I     Status: None   Collection Time    05/16/13 12:15 PM      Result Value Range   Troponin i, poc 0.02  0.00 - 0.08 ng/mL   Comment 3            Comment: Due to the release kinetics of cTnI,     a negative result within the first hours     of the onset of symptoms does not rule out     myocardial infarction with certainty.     If myocardial infarction is still suspected,     repeat the test at appropriate intervals.  POCT I-STAT, CHEM 8     Status: None   Collection Time  05/16/13 12:17 PM      Result Value Range   Sodium 139  135 - 145 mEq/L   Potassium 5.0  3.5 - 5.1 mEq/L   Chloride 107  96 - 112 mEq/L   BUN 18  6 - 23 mg/dL   Creatinine, Ser 1.61  0.50 - 1.35 mg/dL   Glucose, Bld 98  70 - 99 mg/dL   Calcium, Ion 0.96  0.45 - 1.23 mmol/L   TCO2 28  0 - 100 mmol/L   Hemoglobin 17.0  13.0 - 17.0 g/dL   HCT 40.9  81.1 - 91.4 %   Dg Chest Port 1 View  05/16/2013   *RADIOLOGY REPORT*  Clinical Data: shoulder pain  PORTABLE CHEST - 1 VIEW  Comparison: 01/28/2013  Findings: Previous median sternotomy and CABG procedure.  Heart size is normal.  There are no pleural effusions identified.  No airspace consolidation.  Atelectasis versus scarring is noted in the left base.  IMPRESSION:  1.  Left base scar versus atelectasis.   Original Report Authenticated By: Signa Kell,  M.D.    Review of Systems  Constitutional: Negative for fever and diaphoresis.  HENT: Positive for neck pain. Negative for congestion and sore throat.   Respiratory: Negative for shortness of breath.   Cardiovascular: Negative for chest pain, orthopnea and leg swelling.  Gastrointestinal: Negative for nausea, vomiting and abdominal pain.  Musculoskeletal: Positive for myalgias.       Trapezius/ neck pain which started on Tuesday.  Neurological: Negative for dizziness and weakness.  All other systems reviewed and are negative.    Blood pressure 142/71, pulse 94, temperature 98.1 F (36.7 C), temperature source Oral, resp. rate 19, SpO2 98.00%. Physical Exam  Constitutional: He is oriented to person, place, and time. He appears well-developed and well-nourished. No distress.  HENT:  Head: Normocephalic and atraumatic.  Eyes: EOM are normal. Pupils are equal, round, and reactive to light.  Neck: Normal range of motion. Neck supple.  Cardiovascular: Normal rate, regular rhythm, S1 normal and S2 normal.   No murmur heard. Pulses:      Radial pulses are 2+ on the right side, and 2+ on the left side.       Dorsalis pedis pulses are 1+ on the right side, and 1+ on the left side.  Respiratory: Effort normal and breath sounds normal. He has no wheezes. He has no rales.  GI: Soft. Bowel sounds are normal. He exhibits no distension. There is no tenderness.  Musculoskeletal: He exhibits tenderness. He exhibits no edema.  Left trapezius pain with palpation and active motion.  Neurological: He is alert and oriented to person, place, and time. He exhibits normal muscle tone.  Skin: Skin is warm and dry.  Psychiatric: He has a normal mood and affect.     Assessment/Plan Principal Problem:   Musculoskeletal neck pain Active Problems:   Hypercholesterolemia   Smoker   S/P CABG x 2, (LIMA-LAD and RIMA-OM) 01/03/13  Plan:  The patient's neck/trapezius pain appears muscle skeletal. EKG changes  are from unknown etiology possible pericarditis. Initial POC troponin is negative. Will obtain one other standard troponin if that is negative he can be discharged from the cardiology standpoint. We will obtain an outpatient 2-D echocardiogram.  Alle Difabio 05/16/2013, 12:28 PM

## 2013-05-16 NOTE — ED Notes (Signed)
Called to triage x 1 

## 2013-05-16 NOTE — ED Notes (Signed)
Pt c/o left shoulder pain after picking up heavy objects yesterday; pt sts hx of CABG; pt noted to be SOB and pt sts did not sleep well

## 2013-05-16 NOTE — H&P (Signed)
I have seen and evaluated the patient in the ER this PM along with Wilburt Finlay, PA. I agree with his findings, examination as well as impression recommendations.  Reason for Consult:  Abnormal ECG - ? STEMI.  58 y/o man ~4 months s/p CABG for NSTEMI who has been doing well from a cardiac standpoint with routine f/u. Was working on a trailer earlier this week & pulled it up a hill.  He thinks he must have pulled a muscle in his L neck-shoulder.  Started having discomfort 2 evenings ago - initially relieved with Ibuprofin/Aleve.  Again noted discomfort while trying to turn his head to back up his vehicle with trailer.  Was again uncomfortable last PM -- had a hard time sleeping.   This AM was still sore -- he squeezes his L supraspinatus/trapezius region --> much improved & almost absent on my evaluation. He denies any of his previous Anginal equivalent Chest pressure (from his NSTEMI in Jan 2014 -- still quite clear in his mind). No CHF Sx of dyspnea, PND, orthopnea or edema.    His ECG is indeed abnormal with diffuse slight ST Elevation, but no reciprocal changes are noted.  In fact the entire ECG is quite different than his 01/04/13 post CABG ECG.  So much so that I am not sure what to make of it -- ? Consider lead placement.  He was comfortable on my exam & does not appear to be in the midst of a STEMI. His POC Troponin has just returned as "Negative".  Would confirm with an actually Lab study Troponin & would like one repeated in 4-6 hrs to confirm no increase.  If no sign of Troponin elevation, I think his Dx is indeed MSK related L trapezius strain & would Rx with NSAIS. With the abnormal ECG - it has the appearance of possible pericarditis (although none of the symptoms). -- will order OP Echo to assess for effusion & arrange OP f/u.  If Troponin levels show a + trend, will admit for LHC in AM.  Otherwise continue home medications as he seems stable from a cardiac  standpoint.   [name], M.D., M.S. [facility] [address] [place], [place]  [address]  [phone] Pager # [phone] 05/16/2013 12:54 PM

## 2013-05-16 NOTE — ED Notes (Addendum)
MD at bedside. Pt reports he has called a ride for d/c home

## 2013-05-16 NOTE — ED Provider Notes (Signed)
History     CSN: [id number]  Arrival date & time 05/16/13  1054   First MD Initiated Contact with Patient 05/16/13 1110      Chief Complaint  Patient presents with  . Shoulder Pain    Patient is a 58 y.o. male presenting with shoulder pain.  Shoulder Pain This is a new problem. Episode onset: 2 days ago. The problem occurs intermittently (constant since yesterday afternoon, for greater than 12 hours). Progression since onset: improves with motrin; waxing and waning. Pertinent negatives include no abdominal pain, chest pain, chills, congestion, coughing, diaphoresis, fever, headaches, nausea, neck pain, numbness, rash, vomiting or weakness. Exacerbated by: turning his head to look over his shoulder. He has tried NSAIDs for the symptoms. The treatment provided moderate relief.   He has a history of CAD s/p CABG (01/03/13).      Past Medical History  Diagnosis Date  . 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 Coronary artery bypass graft  01/03/2013    Procedure: CORONARY ARTERY BYPASS GRAFTING (CABG);  Surgeon: Purcell Nails, MD;  Location: [facility] OR;  Service: Open Heart Surgery;  Laterality: N/A;  . Intraoperative transesophageal echocardiogram  01/03/2013    Procedure: INTRAOPERATIVE TRANSESOPHAGEAL ECHOCARDIOGRAM;  Surgeon: Purcell Nails, MD;  Location: [facility] OR;  Service: Open Heart Surgery;  Laterality: N/A;    Family History  Problem Relation Age of Onset  . Hypertension Mother   . Cancer Father     bone cancer  . Cancer Brother     metastatic melanoma    History  Substance Use Topics  . Smoking status: Current Every Day Smoker -- 1.50 packs/day    Types: Cigarettes  . Smokeless tobacco: Not on file  . Alcohol Use: Yes      Review of Systems  Constitutional: Negative for fever, chills and diaphoresis.  HENT: Negative for congestion, rhinorrhea, neck pain and neck stiffness.   Eyes: Negative for visual  disturbance.  Respiratory: Negative for cough and shortness of breath.   Cardiovascular: Negative for chest pain and leg swelling.  Gastrointestinal: Negative for nausea, vomiting, abdominal pain and diarrhea.  Genitourinary: Negative for dysuria, urgency, frequency, flank pain and difficulty urinating.  Musculoskeletal: Negative for back pain.  Skin: Negative for rash.  Neurological: Negative for syncope, weakness, numbness and headaches.  All other systems reviewed and are negative.    Allergies  Review of patient's allergies indicates no known allergies.  Home Medications   Current Outpatient Rx  Name  Route  Sig  Dispense  Refill  . aspirin EC 325 MG EC tablet   Oral   Take 1 tablet (325 mg total) by mouth daily.   30 tablet      . Fenofibrate (TRICOR PO)   Oral   Take 1 tablet by mouth daily.         Marland Kitchen lisinopril (PRINIVIL,ZESTRIL) 5 MG tablet   Oral   Take 1 tablet (5 mg total) by mouth daily.   30 tablet   1   . metoprolol (LOPRESSOR) 50 MG tablet   Oral   Take 50 mg by mouth 2 (two) times daily.         . Rosuvastatin Calcium (CRESTOR PO)   Oral   Take 1 tablet by mouth daily.         Marland Kitchen oxyCODONE-acetaminophen (PERCOCET/ROXICET) 5-325 MG per tablet   Oral  Take 2 tablets by mouth every 4 (four) hours as needed for pain. Take 1-2 tablets by mouth every 4 (four) hours as needed for pain.   30 tablet   0     BP 122/85  Pulse 100  Temp(Src) 98.1 F (36.7 C) (Oral)  Resp 23  SpO2 97%  Physical Exam  Nursing note and vitals reviewed. Constitutional: He is oriented to person, place, and time. He appears well-developed and well-nourished. No distress.  HENT:  Head: Normocephalic and atraumatic.  Mouth/Throat: Oropharynx is clear and moist.  Eyes: Conjunctivae and EOM are normal. Pupils are equal, round, and reactive to light. No scleral icterus.  Neck: Normal range of motion. Neck supple. No JVD present.  Cardiovascular: Regular rhythm, normal  heart sounds and intact distal pulses.  Tachycardia present.  Exam reveals no gallop and no friction rub.   No murmur heard. Pulmonary/Chest: Effort normal and breath sounds normal. No respiratory distress. He has no wheezes. He has no rales.  Abdominal: Soft. Bowel sounds are normal. He exhibits no distension. There is no tenderness. There is no rebound and no guarding.  Musculoskeletal: He exhibits no edema.  Neurological: He is alert and oriented to person, place, and time. No cranial nerve deficit. He exhibits normal muscle tone. Coordination normal.  Skin: Skin is warm and dry. He is not diaphoretic.    ED Course  Procedures (including critical care time)  Labs Reviewed  CBC WITH DIFFERENTIAL - Abnormal; Notable for the following:    WBC 14.4 (*)    Neutro Abs 10.9 (*)    Monocytes Absolute 1.4 (*)    All other components within normal limits  TROPONIN I  POCT I-STAT, CHEM 8  POCT I-STAT TROPONIN I   Dg Chest Port 1 View  05/16/2013   *RADIOLOGY REPORT*  Clinical Data: shoulder pain  PORTABLE CHEST - 1 VIEW  Comparison: 01/28/2013  Findings: Previous median sternotomy and CABG procedure.  Heart size is normal.  There are no pleural effusions identified.  No airspace consolidation.  Atelectasis versus scarring is noted in the left base.  IMPRESSION:  1.  Left base scar versus atelectasis.   Original Report Authenticated By: Signa Kell, M.D.    Date: 05/16/2013  Rate: 99  Rhythm: normal sinus rhythm  QRS Axis: normal  Intervals: normal  ST/T Wave abnormalities: ST elevations inferiorly and ST elevations laterally  Conduction Disutrbances:none  Narrative Interpretation:   Old EKG Reviewed: changes noted; new ST elevations in inferior and high lateral leads    1. Abnormal resting ECG findings   2. Musculoskeletal neck pain   3. S/P CABG x 2   4. Shoulder pain, left   5. Neck pain on left side       MDM  57 yo M with h/o CAD s/p CABG a few months ago presents c/o L  shoulder pain.  Atypical.  Onset 2-3 days ago after pulling a boat trailer.  No chest pain.  No dyspnea, diaphoresis, nausea, or other sx.  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  Not worse with exertion.  Fairly constant for 2 day, absolutely constant since yesterday afternoon.  Relieved by motrin.  On exam, he is mildly tachycardic, vitals otherwise wnl; appears anxious, but in NAD.  cardiopulmonary exam remarkable only for midline sternotomy scar.  No M/G/R.  Chest CTAB.  2+ bilateral radial pulses, equal.  No edema.  Patient's EKG markedly abnormal, changed from prior, with about 1 mm ST  elevations in inferior and high lateral leads.  No reciprocal changes.  No PR depressions.  Did not initiate code STEMI, as did not meet criteria and history very atypical.  Faxed EKG to interventional cardiologist on call.  He advised against code STEMI, but came immediately to evaluate patient.    Gave ASA and NTG.  Pain resolved.  Troponin negative X 2.  Cardiology doubts ACS as cause of his CP, which I agree with; very atypical story.  Cards will follow as outpatient and will arrange echo to evaluate as possible pericarditis, though likely musculoskeletal.  Discharged home with return precautions.  Gave RX for percocet and advised scheduled motrin.        Toney Sang, MD 05/16/13 2053

## 2013-05-16 NOTE — ED Notes (Signed)
MD at bedside. 

## 2013-05-18 NOTE — ED Provider Notes (Signed)
I saw and evaluated the patient, reviewed the resident's note and I agree with the findings and plan.   Jamilia Jacques, MD 05/18/13 0703 

## 2013-06-06 ENCOUNTER — Encounter: Payer: Self-pay | Admitting: *Deleted

## 2013-06-06 ENCOUNTER — Ambulatory Visit (INDEPENDENT_AMBULATORY_CARE_PROVIDER_SITE_OTHER): Payer: Medicaid Other | Admitting: Cardiology

## 2013-06-06 ENCOUNTER — Encounter: Payer: Self-pay | Admitting: Cardiology

## 2013-06-06 VITALS — BP 128/78 | HR 77 | Ht 73.0 in | Wt 228.0 lb

## 2013-06-06 DIAGNOSIS — F17200 Nicotine dependence, unspecified, uncomplicated: Secondary | ICD-10-CM

## 2013-06-06 DIAGNOSIS — Z951 Presence of aortocoronary bypass graft: Secondary | ICD-10-CM

## 2013-06-06 DIAGNOSIS — I25810 Atherosclerosis of coronary artery bypass graft(s) without angina pectoris: Secondary | ICD-10-CM

## 2013-06-06 DIAGNOSIS — E7800 Pure hypercholesterolemia, unspecified: Secondary | ICD-10-CM

## 2013-06-06 NOTE — Assessment & Plan Note (Signed)
He is due for Lipids/ LFTs

## 2013-06-06 NOTE — Patient Instructions (Signed)
Continue your efforts at cutting back on smoking. Don't give up!Marland Kitchen Watch your diet as you are doing. Have cholesterol and LFTs checked. See [place] in 6 mos. Call if you need a note for surgical clearance.

## 2013-06-06 NOTE — Assessment & Plan Note (Signed)
unfortunately he is still smoking 1pk/day. He is trying to quit

## 2013-06-06 NOTE — Assessment & Plan Note (Signed)
No angina 

## 2013-06-06 NOTE — Progress Notes (Signed)
06/06/2013 Juan Gates   [birth year]  [id number]  Primary Physicia Dorrene German, MD Primary Cardiologist: Dr Herbie Baltimore  HPI:  The patient is a 57 year old male who we saw in the hospital in January 2014. He was duck hunting and he had chest pain. He is a heavy smoker. 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 He ultimately was cath'd and had bypass grafting x2 on January 03, 2013, with an RIMA to the OM and an LIMA to LAD. Postoperatively, he did well. He, unfortunately, has continued to smoke. He is self employed and self pay and has had some issues getting his medications. Echo has shown his LVF to be normal.            Over Memorial Day weekend he was seen in the ER for back pain and shoulder pain that was felt to be non cardiac. He has established himself a primary care MD- Dr Marcy Salvo. He has had some issues with vivid dreams since his hospitalization that have persisted. I explained that there is a possibility this was from Metoprolol but he did not want to change anything at this time. He could not afford Bystolic. I did cut his Metoprolol back at his last OV.     Current Outpatient Prescriptions  Medication Sig Dispense Refill  . aspirin EC 325 MG EC tablet Take 1 tablet (325 mg total) by mouth daily.  30 tablet    . Choline Fenofibrate (TRILIPIX) 45 MG capsule Take 45 mg by mouth daily.      Marland Kitchen lisinopril (PRINIVIL,ZESTRIL) 5 MG tablet Take 1 tablet (5 mg total) by mouth daily.  30 tablet  1  . metoprolol (LOPRESSOR) 50 MG tablet Take 50 mg by mouth 2 (two) times daily.      Marland Kitchen omega-3 fish oil (MAXEPA) 1000 MG CAPS capsule Take 1 capsule by mouth 2 (two) times daily.      Marland Kitchen oxyCODONE-acetaminophen (PERCOCET/ROXICET) 5-325 MG per tablet Take 2 tablets by mouth every 4 (four) hours as needed for pain. Take 1-2 tablets by mouth every 4 (four) hours as needed for pain.  30 tablet  0  . Rosuvastatin Calcium (CRESTOR PO) Take 1 tablet by mouth daily.       No current  facility-administered medications for this visit.    No Known Allergies  History   Social History  . Marital Status: Divorced    Spouse Name: N/A    Number of Children: N/A  . Years of Education: N/A   Occupational History  . Not on file.   Social History Main Topics  . Smoking status: Current Every Day Smoker -- 1.00 packs/day for 20 years    Types: Cigarettes  . Smokeless tobacco: Never Used  . Alcohol Use: Yes     Comment: beers "every now and then"  . Drug Use: No  . Sexually Active: Not on file   Other Topics Concern  . Not on file   Social History Narrative  . No narrative on file     Review of Systems: General: negative for chills, fever, night sweats or weight changes.  Cardiovascular: negative for chest pain, dyspnea on exertion, edema, orthopnea, palpitations, paroxysmal nocturnal dyspnea or shortness of breath Dermatological: negative for rash Respiratory: negative for cough or wheezing Urologic: negative for hematuria Abdominal: negative for nausea, vomiting, diarrhea, bright red blood per rectum, melena, or hematemesis Neurologic: negative for visual changes, syncope, or dizziness All other systems reviewed and are otherwise negative except  as noted above.    Blood pressure 128/78, pulse 77, height 6\' 1"  (1.854 m), weight 228 lb (103.42 kg).  General appearance: alert, cooperative and no distress Lungs: clear to auscultation bilaterally Heart: regular rate and rhythm  EKG  EKG: normal EKG, normal sinus rhythm, unchanged from previous tracings.  ASSESSMENT AND PLAN:   S/P CABG x 2, (LIMA-LAD and RIMA-OM) 01/03/13 No angina  Smoker unfortunately he is still smoking 1pk/day. He is trying to quit  Hypercholesterolemia He is due for Lipids/ LFTs   PLAN  He is due for lipids and LFTs. I encouraged him to continue to work on smoking cessation and we discussed several strategies. He may need hernia surgery and I told him he should be OK to have this  done from a cardiac standpoint.   Juan Gates KPA-C 06/06/2013 4:11 PM

## 2013-06-08 ENCOUNTER — Encounter: Payer: Self-pay | Admitting: Cardiovascular Disease

## 2013-06-24 LAB — LIPID PANEL
Cholesterol: 200 mg/dL (ref 0–200)
HDL: 36 mg/dL — ABNORMAL LOW (ref >39)
LDL Cholesterol: 111 mg/dL — ABNORMAL HIGH (ref 0–99)
Total CHOL/HDL Ratio: 5.6 Ratio
Triglycerides: 266 mg/dL — ABNORMAL HIGH (ref <150)
VLDL: 53 mg/dL — ABNORMAL HIGH (ref 0–40)

## 2013-06-24 LAB — COMPREHENSIVE METABOLIC PANEL
ALT: 31 U/L (ref 0–53)
AST: 23 U/L (ref 0–37)
Albumin: 4.5 g/dL (ref 3.5–5.2)
Alkaline Phosphatase: 71 U/L (ref 39–117)
BUN: 10 mg/dL (ref 6–23)
CO2: 25 mEq/L (ref 19–32)
Calcium: 9.3 mg/dL (ref 8.4–10.5)
Chloride: 107 mEq/L (ref 96–112)
Creat: 0.93 mg/dL (ref 0.50–1.35)
Glucose, Bld: 93 mg/dL (ref 70–99)
Potassium: 4.3 mEq/L (ref 3.5–5.3)
Sodium: 140 mEq/L (ref 135–145)
Total Bilirubin: 0.5 mg/dL (ref 0.3–1.2)
Total Protein: 6.7 g/dL (ref 6.0–8.3)

## 2013-07-10 DIAGNOSIS — Z0279 Encounter for issue of other medical certificate: Secondary | ICD-10-CM

## 2013-07-11 ENCOUNTER — Ambulatory Visit (INDEPENDENT_AMBULATORY_CARE_PROVIDER_SITE_OTHER): Payer: Medicaid Other | Admitting: General Surgery

## 2013-07-11 ENCOUNTER — Encounter (INDEPENDENT_AMBULATORY_CARE_PROVIDER_SITE_OTHER): Payer: Self-pay | Admitting: General Surgery

## 2013-07-11 ENCOUNTER — Encounter (HOSPITAL_COMMUNITY): Payer: Self-pay | Admitting: Pharmacy Technician

## 2013-07-11 VITALS — BP 132/82 | HR 80 | Resp 16 | Ht 73.0 in | Wt 231.0 lb

## 2013-07-11 DIAGNOSIS — K4090 Unilateral inguinal hernia, without obstruction or gangrene, not specified as recurrent: Secondary | ICD-10-CM

## 2013-07-11 NOTE — Progress Notes (Signed)
Chief complaint: Left inguinal hernia  History:  Patient is a pleasant 57 year old male referred by Dr. Concepcion Elk for a long-standing left inguinal hernia. He states it has been present for many years. He did not have insurance for quite a while and avoided seeking attention. He has history of coronary artery disease and underwent a successful coronary bypass graft in January. He has been cleared by cardiology for surgery. He has a lot of swelling in his left scrotum but only mild discomfort and no significant associated GI complaints such as nausea or vomiting or abdominal distention. He has never had a hernia repair previously.  Past Medical History  Diagnosis Date  . 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 Hyperlipidemia   . Hypertension    Past Surgical History  Procedure Laterality Date  . Coronary artery bypass graft  01/03/2013    Procedure: CORONARY ARTERY BYPASS GRAFTING (CABG);  Surgeon: Purcell Nails, MD;  Location: [facility] OR;  Service: Open Heart Surgery;  Laterality: N/A;  . Intraoperative transesophageal echocardiogram  01/03/2013    Procedure: INTRAOPERATIVE TRANSESOPHAGEAL ECHOCARDIOGRAM;  Surgeon: Purcell Nails, MD;  Location: [facility] OR;  Service: Open Heart Surgery;  Laterality: N/A;  . Cardiac catheterization  12/31/2012    2 vessel cors 90%hazy stenosis prox circ with long segment 60-70%LAD,mod RCA,severe lesion small marginal branch   Current Outpatient Prescriptions  Medication Sig Dispense Refill  . aspirin EC 325 MG EC tablet Take 1 tablet (325 mg total) by mouth daily.  30 tablet    . Choline Fenofibrate (TRILIPIX) 45 MG capsule Take 45 mg by mouth daily.      . diclofenac (VOLTAREN) 75 MG EC tablet Take 75 mg by mouth 2 (two) times daily.      . diphenhydrAMINE (BENADRYL) 25 mg capsule Take 25 mg by mouth every 6 (six) hours as needed for itching.      Marland Kitchen lisinopril (PRINIVIL,ZESTRIL) 5 MG tablet Take 1 tablet (5 mg total) by mouth daily.  30 tablet  1  .  metoprolol (LOPRESSOR) 50 MG tablet Take 50 mg by mouth 2 (two) times daily.      Marland Kitchen omega-3 fish oil (MAXEPA) 1000 MG CAPS capsule Take 1 capsule by mouth 2 (two) times daily.      . Rosuvastatin Calcium (CRESTOR PO) Take 1 tablet by mouth daily.       No current facility-administered medications for this visit.   No Known Allergies History  Substance Use Topics  . Smoking status: Current Every Day Smoker -- 1.00 packs/day for 20 years    Types: Cigarettes  . Smokeless tobacco: Never Used  . Alcohol Use: Yes     Comment: beers "every now and then"   Review of systems General: No fever chills or weight loss Respiratory: No shortness breath cough wheezing Cardiac: No chest pain palpitations swelling Abdomen: As above Extremities: Chronic joint and back pain  Exam: BP 132/82  Pulse 80  Resp 16  Ht 6\' 1"  (1.854 m)  Wt 231 lb (104.781 kg)  BMI 30.48 kg/m2 General: Well-appearing male in no distress Skin: Pan: The retroflexion HEENT: No masses or thyromegaly. Nonicteric Lymph nodes: No cervical or subclavicular involvement spell Lungs: Clear equal breath sounds bilaterally Cardiac: Well-healed sternotomy. Regular rate and rhythm. No edema. Abdomen: Generally soft and nontender. There is a very large left scrotal hernia not reducible. Nothing palpable on the right. GU: Normal of a hernia Extremities: No joint swelling  deformity Neurologic: Fully oriented gait normal  Assessment and plan: Very large long-standing left scrotal hernia. This obviously should be repaired to prevent incarceration of and relieve his current symptoms. I recommended open repair overnight hospitalization.  We discussed the indications for the procedure and its nature including use of mesh and expected recovery results. Risks of anesthetic complications, bleeding, infection, recurrence and chronic pain were discussed and understood. All questions were answered.  He was given literature regarding the procedure.  We will schedule this at his convenience.

## 2013-07-15 ENCOUNTER — Encounter (HOSPITAL_COMMUNITY): Payer: Self-pay

## 2013-07-15 ENCOUNTER — Telehealth (INDEPENDENT_AMBULATORY_CARE_PROVIDER_SITE_OTHER): Payer: Self-pay

## 2013-07-15 ENCOUNTER — Encounter (HOSPITAL_COMMUNITY)
Admission: RE | Admit: 2013-07-15 | Discharge: 2013-07-15 | Disposition: A | Discharge status: Home / Self Care | Payer: Medicaid Other | Source: Ambulatory Visit | Attending: General Surgery | Admitting: General Surgery

## 2013-07-15 DIAGNOSIS — K4090 Unilateral inguinal hernia, without obstruction or gangrene, not specified as recurrent: Secondary | ICD-10-CM | POA: Insufficient documentation

## 2013-07-15 DIAGNOSIS — Z01812 Encounter for preprocedural laboratory examination: Secondary | ICD-10-CM | POA: Insufficient documentation

## 2013-07-15 HISTORY — DX: Atherosclerotic heart disease of native coronary artery without angina pectoris: I25.10

## 2013-07-15 HISTORY — DX: Other complications of anesthesia, initial encounter: T88.59XA

## 2013-07-15 HISTORY — DX: Anxiety disorder, unspecified: F41.9

## 2013-07-15 HISTORY — DX: Adverse effect of unspecified anesthetic, initial encounter: T41.45XA

## 2013-07-15 HISTORY — DX: Unspecified osteoarthritis, unspecified site: M19.90

## 2013-07-15 HISTORY — DX: Acute myocardial infarction, unspecified: I21.9

## 2013-07-15 LAB — BASIC METABOLIC PANEL
BUN: 17 mg/dL (ref 6–23)
CO2: 23 mEq/L (ref 19–32)
Calcium: 9.6 mg/dL (ref 8.4–10.5)
Creatinine, Ser: 1 mg/dL (ref 0.50–1.35)
GFR calc non Af Amer: 83 mL/min — ABNORMAL LOW (ref >90)
Glucose, Bld: 96 mg/dL (ref 70–99)
Sodium: 137 mEq/L (ref 135–145)

## 2013-07-15 LAB — CBC
Hemoglobin: 15.4 g/dL (ref 13.0–17.0)
MCH: 31.6 pg (ref 26.0–34.0)
MCHC: 33.4 g/dL (ref 30.0–36.0)
MCV: 94.7 fL (ref 78.0–100.0)
RBC: 4.87 MIL/uL (ref 4.22–5.81)

## 2013-07-15 NOTE — Progress Notes (Signed)
Chest x-ray 05/16/13 on EPIC, EKG 06/06/13 on EPIC

## 2013-07-15 NOTE — Patient Instructions (Addendum)
20 Reggie Bise  07/15/2013   Your procedure is scheduled on: 07/22/13  Report to [facility] at 11:45 AM.  Call this number if you have problems the morning of surgery [phone]: [phone]   Remember:   Do not eat food After Midnight, clear liquids from midnight until 8:15 am on 07/22/13 then nothing.      Take these medicines the morning of surgery with A SIP OF WATER: metoprolol   Do not wear jewelry, make-up or nail polish.  Do not wear lotions, powders, or perfumes. You may wear deodorant.  Do not shave 48 hours prior to surgery. Men may shave face and neck.  Do not bring valuables to the hospital.  Contacts, dentures or bridgework may not be worn into surgery.  Leave suitcase in the car. After surgery it may be brought to your room.  For patients admitted to the hospital, checkout time is 11:00 AM the day of discharge.    Birdie Sons, RN  pre op nurse call if needed [phone]    FAILURE TO FOLLOW THESE INSTRUCTIONS MAY RESULT IN CANCELLATION OF YOUR SURGERY   Patient Signature: ___________________________________________

## 2013-07-15 NOTE — Telephone Encounter (Signed)
Paged Dr. Johna Sheriff to confirm with [facility] @ Pre-Admission that patient is scheduled to have Left Open Hernia Repair w/mesh.

## 2013-07-15 NOTE — Telephone Encounter (Signed)
Message copied by Maryan Puls on Mon Jul 15, 2013  3:22 PM ------      Message from: Marin Shutter      Created: Mon Jul 15, 2013 12:41 PM      Regarding: Dr. Nanci Pina from pre-op at [facility] called and would like to know if it's a left or right hernia.  Both are stated on report.  Please call her [phone]  Thx ------

## 2013-07-22 ENCOUNTER — Ambulatory Visit (HOSPITAL_COMMUNITY): Payer: Medicaid Other | Admitting: Anesthesiology

## 2013-07-22 ENCOUNTER — Encounter (HOSPITAL_COMMUNITY): Payer: Self-pay | Admitting: Anesthesiology

## 2013-07-22 ENCOUNTER — Encounter (HOSPITAL_COMMUNITY): Payer: Self-pay | Admitting: *Deleted

## 2013-07-22 ENCOUNTER — Encounter (HOSPITAL_COMMUNITY)
Admission: RE | Disposition: A | Discharge status: Home / Self Care | Payer: Self-pay | Source: Ambulatory Visit | Attending: General Surgery

## 2013-07-22 ENCOUNTER — Observation Stay (HOSPITAL_COMMUNITY)
Admission: RE | Admit: 2013-07-22 | Discharge: 2013-07-23 | Disposition: A | Discharge status: Home / Self Care | Payer: Medicaid Other | Source: Ambulatory Visit | Attending: General Surgery | Admitting: General Surgery

## 2013-07-22 DIAGNOSIS — I10 Essential (primary) hypertension: Secondary | ICD-10-CM | POA: Insufficient documentation

## 2013-07-22 DIAGNOSIS — E785 Hyperlipidemia, unspecified: Secondary | ICD-10-CM | POA: Insufficient documentation

## 2013-07-22 DIAGNOSIS — Z7982 Long term (current) use of aspirin: Secondary | ICD-10-CM | POA: Insufficient documentation

## 2013-07-22 DIAGNOSIS — K4090 Unilateral inguinal hernia, without obstruction or gangrene, not specified as recurrent: Secondary | ICD-10-CM

## 2013-07-22 DIAGNOSIS — I2510 Atherosclerotic heart disease of native coronary artery without angina pectoris: Secondary | ICD-10-CM | POA: Insufficient documentation

## 2013-07-22 DIAGNOSIS — K4030 Unilateral inguinal hernia, with obstruction, without gangrene, not specified as recurrent: Secondary | ICD-10-CM

## 2013-07-22 DIAGNOSIS — Z951 Presence of aortocoronary bypass graft: Secondary | ICD-10-CM | POA: Insufficient documentation

## 2013-07-22 DIAGNOSIS — Z79899 Other long term (current) drug therapy: Secondary | ICD-10-CM | POA: Insufficient documentation

## 2013-07-22 HISTORY — PX: INSERTION OF MESH: SHX5868

## 2013-07-22 HISTORY — PX: HERNIA REPAIR: SHX51

## 2013-07-22 HISTORY — PX: INGUINAL HERNIA REPAIR: SHX194

## 2013-07-22 SURGERY — REPAIR, HERNIA, INGUINAL, ADULT
Anesthesia: General | Site: Groin | Laterality: Left | Wound class: Clean

## 2013-07-22 MED ORDER — BUPIVACAINE LIPOSOME 1.3 % IJ SUSP
INTRAMUSCULAR | Status: DC | PRN
  Administered 2013-07-22: 10 mL

## 2013-07-22 MED ORDER — NEOSTIGMINE METHYLSULFATE 1 MG/ML IJ SOLN
INTRAMUSCULAR | Status: DC | PRN
  Administered 2013-07-22: 5 mg via INTRAVENOUS

## 2013-07-22 MED ORDER — ONDANSETRON HCL 4 MG/2ML IJ SOLN
INTRAMUSCULAR | Status: DC | PRN
  Administered 2013-07-22: 4 mg via INTRAVENOUS

## 2013-07-22 MED ORDER — OXYCODONE-ACETAMINOPHEN 5-325 MG PO TABS
1.0000 | ORAL_TABLET | ORAL | Status: DC | PRN
  Administered 2013-07-22 – 2013-07-23 (×4): 2 via ORAL
  Filled 2013-07-22 (×4): qty 2

## 2013-07-22 MED ORDER — ONDANSETRON HCL 4 MG/2ML IJ SOLN: 4.0000 mg | Freq: Four times a day (QID) | INTRAMUSCULAR | Status: DC | PRN

## 2013-07-22 MED ORDER — NICOTINE 21 MG/24HR TD PT24
21.0000 mg | MEDICATED_PATCH | Freq: Every day | TRANSDERMAL | Status: DC
  Administered 2013-07-22 – 2013-07-23 (×2): 21 mg via TRANSDERMAL
  Filled 2013-07-22 (×2): qty 1

## 2013-07-22 MED ORDER — MEPERIDINE HCL 50 MG/ML IJ SOLN: 6.2500 mg | INTRAMUSCULAR | Status: DC | PRN

## 2013-07-22 MED ORDER — MORPHINE SULFATE 2 MG/ML IJ SOLN
2.0000 mg | INTRAMUSCULAR | Status: DC | PRN
  Administered 2013-07-22: 2 mg via INTRAVENOUS
  Filled 2013-07-22: qty 1

## 2013-07-22 MED ORDER — ROCURONIUM BROMIDE 100 MG/10ML IV SOLN
INTRAVENOUS | Status: DC | PRN
  Administered 2013-07-22: 10 mg via INTRAVENOUS
  Administered 2013-07-22: 5 mg via INTRAVENOUS
  Administered 2013-07-22 (×2): 10 mg via INTRAVENOUS
  Administered 2013-07-22: 35 mg via INTRAVENOUS
  Administered 2013-07-22: 10 mg via INTRAVENOUS
  Administered 2013-07-22: 5 mg via INTRAVENOUS
  Administered 2013-07-22: 10 mg via INTRAVENOUS
  Administered 2013-07-22: 5 mg via INTRAVENOUS

## 2013-07-22 MED ORDER — METOPROLOL TARTRATE 50 MG PO TABS
50.0000 mg | ORAL_TABLET | Freq: Two times a day (BID) | ORAL | Status: DC
Start: 2013-07-22 — End: 2013-07-23
  Administered 2013-07-22 – 2013-07-23 (×2): 50 mg via ORAL
  Filled 2013-07-22 (×3): qty 1

## 2013-07-22 MED ORDER — SUCCINYLCHOLINE CHLORIDE 20 MG/ML IJ SOLN
INTRAMUSCULAR | Status: DC | PRN
  Administered 2013-07-22: 100 mg via INTRAVENOUS

## 2013-07-22 MED ORDER — LIDOCAINE HCL (CARDIAC) 20 MG/ML IV SOLN
INTRAVENOUS | Status: DC | PRN
  Administered 2013-07-22: 50 mg via INTRAVENOUS

## 2013-07-22 MED ORDER — FENTANYL CITRATE 0.05 MG/ML IJ SOLN
25.0000 ug | INTRAMUSCULAR | Status: DC | PRN
  Administered 2013-07-22 (×3): 50 ug via INTRAVENOUS

## 2013-07-22 MED ORDER — HYDROMORPHONE HCL PF 1 MG/ML IJ SOLN
INTRAMUSCULAR | Status: DC | PRN
  Administered 2013-07-22: 1 mg via INTRAVENOUS
  Administered 2013-07-22: 0.5 mg via INTRAVENOUS

## 2013-07-22 MED ORDER — MIDAZOLAM HCL 5 MG/5ML IJ SOLN
INTRAMUSCULAR | Status: DC | PRN
  Administered 2013-07-22: 2 mg via INTRAVENOUS

## 2013-07-22 MED ORDER — LISINOPRIL 5 MG PO TABS
5.0000 mg | ORAL_TABLET | Freq: Every morning | ORAL | Status: DC
  Administered 2013-07-23: 5 mg via ORAL
  Filled 2013-07-22: qty 1

## 2013-07-22 MED ORDER — 0.9 % SODIUM CHLORIDE (POUR BTL) OPTIME
TOPICAL | Status: DC | PRN
  Administered 2013-07-22: 1000 mL

## 2013-07-22 MED ORDER — LACTATED RINGERS IV SOLN
INTRAVENOUS | Status: DC
  Administered 2013-07-22: 1000 mL via INTRAVENOUS
  Administered 2013-07-22: 15:00:00 via INTRAVENOUS

## 2013-07-22 MED ORDER — GLYCOPYRROLATE 0.2 MG/ML IJ SOLN
INTRAMUSCULAR | Status: DC | PRN
  Administered 2013-07-22: .8 mg via INTRAVENOUS

## 2013-07-22 MED ORDER — CEFAZOLIN SODIUM-DEXTROSE 2-3 GM-% IV SOLR
2.0000 g | INTRAVENOUS | Status: AC
  Administered 2013-07-22: 2 g via INTRAVENOUS

## 2013-07-22 MED ORDER — LACTATED RINGERS IV SOLN: INTRAVENOUS | Status: DC

## 2013-07-22 MED ORDER — FENTANYL CITRATE 0.05 MG/ML IJ SOLN
INTRAMUSCULAR | Status: AC
  Filled 2013-07-22: qty 2

## 2013-07-22 MED ORDER — HEPARIN SODIUM (PORCINE) 5000 UNIT/ML IJ SOLN
5000.0000 [IU] | Freq: Three times a day (TID) | INTRAMUSCULAR | Status: DC
  Administered 2013-07-22 – 2013-07-23 (×2): 5000 [IU] via SUBCUTANEOUS
  Filled 2013-07-22 (×5): qty 1

## 2013-07-22 MED ORDER — PROMETHAZINE HCL 25 MG/ML IJ SOLN: 6.2500 mg | INTRAMUSCULAR | Status: DC | PRN

## 2013-07-22 MED ORDER — BUPIVACAINE LIPOSOME 1.3 % IJ SUSP
20.0000 mL | Freq: Once | INTRAMUSCULAR | Status: DC
  Filled 2013-07-22: qty 20

## 2013-07-22 MED ORDER — ONDANSETRON HCL 4 MG PO TABS: 4.0000 mg | ORAL_TABLET | Freq: Four times a day (QID) | ORAL | Status: DC | PRN

## 2013-07-22 MED ORDER — PROPOFOL 10 MG/ML IV BOLUS
INTRAVENOUS | Status: DC | PRN
  Administered 2013-07-22: 200 mg via INTRAVENOUS

## 2013-07-22 MED ORDER — FENTANYL CITRATE 0.05 MG/ML IJ SOLN
INTRAMUSCULAR | Status: DC | PRN
  Administered 2013-07-22: 100 ug via INTRAVENOUS
  Administered 2013-07-22: 50 ug via INTRAVENOUS
  Administered 2013-07-22: 100 ug via INTRAVENOUS

## 2013-07-22 MED ORDER — LORAZEPAM 0.5 MG PO TABS
0.5000 mg | ORAL_TABLET | Freq: Three times a day (TID) | ORAL | Status: DC | PRN
  Administered 2013-07-23: 0.5 mg via ORAL
  Filled 2013-07-22: qty 1

## 2013-07-22 MED ORDER — PNEUMOCOCCAL VAC POLYVALENT 25 MCG/0.5ML IJ INJ: 0.5000 mL | INJECTION | INTRAMUSCULAR | Status: DC | PRN

## 2013-07-22 MED ORDER — POTASSIUM CHLORIDE IN NACL 20-0.9 MEQ/L-% IV SOLN
INTRAVENOUS | Status: DC
  Administered 2013-07-22: 20:00:00 via INTRAVENOUS
  Filled 2013-07-22 (×2): qty 1000

## 2013-07-22 MED ORDER — DIPHENHYDRAMINE HCL 50 MG PO CAPS: 50.0000 mg | ORAL_CAPSULE | Freq: Every evening | ORAL | Status: DC | PRN

## 2013-07-22 SURGICAL SUPPLY — 50 items
BLADE HEX COATED 2.75 (ELECTRODE) ×2 IMPLANT
BLADE SURG 15 STRL LF DISP TIS (BLADE) ×1 IMPLANT
BLADE SURG 15 STRL SS (BLADE) ×1
BLADE SURG SZ10 CARB STEEL (BLADE) ×2 IMPLANT
CANISTER SUCTION 2500CC (MISCELLANEOUS) ×2 IMPLANT
CLOTH BEACON ORANGE TIMEOUT ST (SAFETY) ×2 IMPLANT
DECANTER SPIKE VIAL GLASS SM (MISCELLANEOUS) IMPLANT
DERMABOND ADVANCED (GAUZE/BANDAGES/DRESSINGS) ×2
DERMABOND ADVANCED .7 DNX12 (GAUZE/BANDAGES/DRESSINGS) ×2 IMPLANT
DRAIN PENROSE 18X1/2 LTX STRL (DRAIN) ×2 IMPLANT
DRAPE INCISE IOBAN 66X45 STRL (DRAPES) ×2 IMPLANT
DRAPE LAPAROTOMY TRNSV 102X78 (DRAPE) ×2 IMPLANT
ELECT REM PT RETURN 9FT ADLT (ELECTROSURGICAL) ×2
ELECTRODE REM PT RTRN 9FT ADLT (ELECTROSURGICAL) ×1 IMPLANT
GLOVE BIOGEL PI IND STRL 7.0 (GLOVE) ×1 IMPLANT
GLOVE BIOGEL PI IND STRL 7.5 (GLOVE) ×1 IMPLANT
GLOVE BIOGEL PI INDICATOR 7.0 (GLOVE) ×1
GLOVE BIOGEL PI INDICATOR 7.5 (GLOVE) ×1
GLOVE SS BIOGEL STRL SZ 7.5 (GLOVE) ×1 IMPLANT
GLOVE SUPERSENSE BIOGEL SZ 7.5 (GLOVE) ×1
GOWN STRL NON-REIN LRG LVL3 (GOWN DISPOSABLE) IMPLANT
GOWN STRL REIN XL XLG (GOWN DISPOSABLE) ×10 IMPLANT
KIT BASIN OR (CUSTOM PROCEDURE TRAY) ×2 IMPLANT
MESH HERNIA 6X6 BARD (Mesh General) ×1 IMPLANT
MESH HERNIA BARD 6X6 (Mesh General) ×1 IMPLANT
NEEDLE HYPO 22GX1.5 SAFETY (NEEDLE) IMPLANT
NEEDLE HYPO 25X1 1.5 SAFETY (NEEDLE) ×2 IMPLANT
NS IRRIG 1000ML POUR BTL (IV SOLUTION) ×2 IMPLANT
PACK BASIC VI WITH GOWN DISP (CUSTOM PROCEDURE TRAY) ×2 IMPLANT
PENCIL BUTTON HOLSTER BLD 10FT (ELECTRODE) ×2 IMPLANT
SPONGE GAUZE 4X4 12PLY (GAUZE/BANDAGES/DRESSINGS) IMPLANT
SPONGE LAP 18X18 X RAY DECT (DISPOSABLE) ×4 IMPLANT
SPONGE LAP 4X18 X RAY DECT (DISPOSABLE) ×2 IMPLANT
STRIP CLOSURE SKIN 1/2X4 (GAUZE/BANDAGES/DRESSINGS) IMPLANT
SUT MNCRL AB 4-0 PS2 18 (SUTURE) ×2 IMPLANT
SUT PROLENE 2 0 CT2 30 (SUTURE) ×10 IMPLANT
SUT SILK 2 0 SH (SUTURE) IMPLANT
SUT VIC AB 2-0 CT1 18 (SUTURE) IMPLANT
SUT VIC AB 2-0 CT1 27 (SUTURE) ×1
SUT VIC AB 2-0 CT1 TAPERPNT 27 (SUTURE) ×1 IMPLANT
SUT VIC AB 3-0 54XBRD REEL (SUTURE) ×1 IMPLANT
SUT VIC AB 3-0 BRD 54 (SUTURE) ×1
SUT VIC AB 3-0 CT1 27 (SUTURE)
SUT VIC AB 3-0 CT1 TAPERPNT 27 (SUTURE) IMPLANT
SUT VIC AB 3-0 SH 27 (SUTURE) ×1
SUT VIC AB 3-0 SH 27XBRD (SUTURE) ×1 IMPLANT
SYR BULB IRRIGATION 50ML (SYRINGE) ×2 IMPLANT
SYR CONTROL 10ML LL (SYRINGE) ×2 IMPLANT
TOWEL OR 17X26 10 PK STRL BLUE (TOWEL DISPOSABLE) ×2 IMPLANT
YANKAUER SUCT BULB TIP 10FT TU (MISCELLANEOUS) ×2 IMPLANT

## 2013-07-22 NOTE — Anesthesia Preprocedure Evaluation (Addendum)
Anesthesia Evaluation  Patient identified by MRN, date of birth, ID band Patient awake    Reviewed: Allergy & Precautions, H&P , NPO status , Patient's Chart, lab work & pertinent test results  History of Anesthesia Complications Negative for: history of anesthetic complications  Airway Mallampati: II TM Distance: >3 FB Neck ROM: Full    Dental no notable dental hx. (+) Edentulous Lower and Edentulous Upper   Pulmonary Current Smoker,  breath sounds clear to auscultation  Pulmonary exam normal       Cardiovascular hypertension, Pt. on medications + CAD and + CABG Rhythm:Regular Rate:Normal     Neuro/Psych negative neurological ROS  negative psych ROS   GI/Hepatic negative GI ROS, Neg liver ROS,   Endo/Other  negative endocrine ROS  Renal/GU negative Renal ROS  negative genitourinary   Musculoskeletal negative musculoskeletal ROS (+)   Abdominal   Peds negative pediatric ROS (+)  Hematology negative hematology ROS (+)   Anesthesia Other Findings   Reproductive/Obstetrics negative OB ROS                          Anesthesia Physical Anesthesia Plan  ASA: III  Anesthesia Plan: General   Post-op Pain Management:    Induction: Intravenous  Airway Management Planned: Oral ETT  Additional Equipment:   Intra-op Plan:   Post-operative Plan: Extubation in OR  Informed Consent: I have reviewed the patients History and Physical, chart, labs and discussed the procedure including the risks, benefits and alternatives for the proposed anesthesia with the patient or authorized representative who has indicated his/her understanding and acceptance.   Dental advisory given  Plan Discussed with: CRNA  Anesthesia Plan Comments:         Anesthesia Quick Evaluation

## 2013-07-22 NOTE — Op Note (Signed)
Preoperative Diagnosis: Left scrotal inguinal hernia  Postoperative diagnosis: Same  Procedure: Open repair of left scrotal inguinal hernia with mesh  Surgeon: Glenna Fellows M.D.  Assistant: Gaynelle Adu MD  Anesthesia: GET  Estimated blood loss: Minimal  Complications: None  Description of procedure: Patient was brought to the operating room, placed in supine position on the operating table. General endotracheal anesthesia was induced by anesthesiology. The groin was widely sterilely prepped and draped. The patient received preoperative IV antibiotics. Patient timeout was performed and correct procedure verified. I initially was able to reduce the contents of his large scrotal hernia. An oblique incision was made in the groin and dissection carried down through the subcutaneous tissue using cautery. The external oblique was identified and cleared down to the inguinal ligament. The external oblique was divided along the lines of its fibers through the external ring.A very large hernia sac and the cord were dissected up off the floor the pubic tubercle. With very extensive dissection,a very large indirect sac was identified associated with the cord structures.  It was dissected completely free from cord structures using blunt and cautery dissection up to the internal ring. The sac was suture-ligated with 2-0 prolene at the internal ring, divided, and removed. The pubic tubercle, rectus sheath and shelving edge of the inguinal ligament were defined. The anatomy here was somewhat distorted due to 2 thickening and stretching of the internal oblique but with some minor debridement the structures were all clearly identified. A piece of Bard polypropylene  mesh was chosen and trimmed to size to fit the floor of the inguinal canal with tails to go around the cord at the internal ring. The mesh was sutured initially to the pubic tubercle and then to the iliopubic tract and inguinal ligament working medial  to lateral with 2-0 Prolene until the suture was lateral to the internal ring. Medially the mesh was sutured to the rectus sheath with interrupted 2-0 Prolene. The tails were then tacked together lateral to the internal ring creating a new internal ring snug to a fingertip. This provided nice broad coverage of the direct and indirect spaces. The soft tissue was then infiltrated with local anesthesia. The cord was returned to its anatomic position. There was no bleeding. The external oblique was closed with running 3-0 Vicryl. Scarpa's fascia was closed with running 3-0 Vicryl. The skin was closed with subcuticular Monocryl and Dermabond. Sponge needle and instrument counts were correct. The patient was taken to recovery in good condition.  Total operative time was approximately one hour 45 minutes.  Leean Amezcua T  07/22/2013

## 2013-07-22 NOTE — Transfer of Care (Signed)
Immediate Anesthesia Transfer of Care Note  Patient: Juan Gates  Procedure(s) Performed: Procedure(s) (LRB): OPEN LEFT INGUINAL HERNIA REPAIR  (Left) INSERTION OF MESH (Left)  Patient Location: PACU  Anesthesia Type: General  Level of Consciousness: sedated, patient cooperative and responds to stimulaton  Airway & Oxygen Therapy: Patient Spontanous Breathing and Patient connected to face mask oxgen  Post-op Assessment: Report given to PACU RN and Post -op Vital signs reviewed and stable  Post vital signs: Reviewed and stable  Complications: No apparent anesthesia complications

## 2013-07-22 NOTE — Interval H&P Note (Signed)
History and Physical Interval Note:  07/22/2013 1:40 PM  Juan Gates  has presented today for surgery, with the diagnosis of inguinal hernia  The various methods of treatment have been discussed with the patient and family. After consideration of risks, benefits and other options for treatment, the patient has consented to  Procedure(s): OPEN LEFT INGUINAL HERNIA REPAIR  (Left) INSERTION OF MESH (Left) as a surgical intervention .  The patient's history has been reviewed, patient examined, no change in status, stable for surgery.  I have reviewed the patient's chart and labs.  Questions were answered to the patient's satisfaction.     Akyra Bouchie T

## 2013-07-22 NOTE — Anesthesia Postprocedure Evaluation (Signed)
Anesthesia Post Note  Patient: Juan Gates  Procedure(s) Performed: Procedure(s) (LRB): OPEN LEFT INGUINAL HERNIA REPAIR  (Left) INSERTION OF MESH (Left)  Anesthesia type: General  Patient location: PACU  Post pain: Pain level controlled  Post assessment: Post-op Vital signs reviewed  Last Vitals: BP 136/80  Pulse 74  Temp(Src) 36.6 C (Oral)  Resp 15  SpO2 100%  Post vital signs: Reviewed  Level of consciousness: sedated  Complications: No apparent anesthesia complications

## 2013-07-22 NOTE — H&P (View-Only) (Signed)
Chief complaint: Left inguinal hernia  History:  Patient is a pleasant 57-year-old male referred by Dr. Avbuere for a long-standing left inguinal hernia. He states it has been present for many years. He did not have insurance for quite a while and avoided seeking attention. He has history of coronary artery disease and underwent a successful coronary bypass graft in January. He has been cleared by cardiology for surgery. He has a lot of swelling in his left scrotum but only mild discomfort and no significant associated GI complaints such as nausea or vomiting or abdominal distention. He has never had a hernia repair previously.  Past Medical History  Diagnosis Date  . 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 Hyperlipidemia   . Hypertension    Past Surgical History  Procedure Laterality Date  . Coronary artery bypass graft  01/03/2013    Procedure: CORONARY ARTERY BYPASS GRAFTING (CABG);  Surgeon: Clarence H Owen, MD;  Location: [facility] OR;  Service: Open Heart Surgery;  Laterality: N/A;  . Intraoperative transesophageal echocardiogram  01/03/2013    Procedure: INTRAOPERATIVE TRANSESOPHAGEAL ECHOCARDIOGRAM;  Surgeon: Clarence H Owen, MD;  Location: [facility] OR;  Service: Open Heart Surgery;  Laterality: N/A;  . Cardiac catheterization  12/31/2012    2 vessel cors 90%hazy stenosis prox circ with long segment 60-70%LAD,mod RCA,severe lesion small marginal branch   Current Outpatient Prescriptions  Medication Sig Dispense Refill  . aspirin EC 325 MG EC tablet Take 1 tablet (325 mg total) by mouth daily.  30 tablet    . Choline Fenofibrate (TRILIPIX) 45 MG capsule Take 45 mg by mouth daily.      . diclofenac (VOLTAREN) 75 MG EC tablet Take 75 mg by mouth 2 (two) times daily.      . diphenhydrAMINE (BENADRYL) 25 mg capsule Take 25 mg by mouth every 6 (six) hours as needed for itching.      . lisinopril (PRINIVIL,ZESTRIL) 5 MG tablet Take 1 tablet (5 mg total) by mouth daily.  30 tablet  1  .  metoprolol (LOPRESSOR) 50 MG tablet Take 50 mg by mouth 2 (two) times daily.      . omega-3 fish oil (MAXEPA) 1000 MG CAPS capsule Take 1 capsule by mouth 2 (two) times daily.      . Rosuvastatin Calcium (CRESTOR PO) Take 1 tablet by mouth daily.       No current facility-administered medications for this visit.   No Known Allergies History  Substance Use Topics  . Smoking status: Current Every Day Smoker -- 1.00 packs/day for 20 years    Types: Cigarettes  . Smokeless tobacco: Never Used  . Alcohol Use: Yes     Comment: beers "every now and then"   Review of systems General: No fever chills or weight loss Respiratory: No shortness breath cough wheezing Cardiac: No chest pain palpitations swelling Abdomen: As above Extremities: Chronic joint and back pain  Exam: BP 132/82  Pulse 80  Resp 16  Ht 6' 1" (1.854 m)  Wt 231 lb (104.781 kg)  BMI 30.48 kg/m2 General: Well-appearing male in no distress Skin: Pan: The retroflexion HEENT: No masses or thyromegaly. Nonicteric Lymph nodes: No cervical or subclavicular involvement spell Lungs: Clear equal breath sounds bilaterally Cardiac: Well-healed sternotomy. Regular rate and rhythm. No edema. Abdomen: Generally soft and nontender. There is a very large left scrotal hernia not reducible. Nothing palpable on the right. GU: Normal of a hernia Extremities: No joint swelling   deformity Neurologic: Fully oriented gait normal  Assessment and plan: Very large long-standing left scrotal hernia. This obviously should be repaired to prevent incarceration of and relieve his current symptoms. I recommended open repair overnight hospitalization.  We discussed the indications for the procedure and its nature including use of mesh and expected recovery results. Risks of anesthetic complications, bleeding, infection, recurrence and chronic pain were discussed and understood. All questions were answered.  He was given literature regarding the procedure.  We will schedule this at his convenience. 

## 2013-07-23 ENCOUNTER — Encounter (HOSPITAL_COMMUNITY): Payer: Self-pay | Admitting: General Surgery

## 2013-07-23 MED ORDER — LORAZEPAM 0.5 MG PO TABS: 1.0000 mg | ORAL_TABLET | Freq: Every evening | ORAL | Status: DC | PRN

## 2013-07-23 MED ORDER — OXYCODONE-ACETAMINOPHEN 5-325 MG PO TABS: 1.0000 | ORAL_TABLET | ORAL | Status: DC | PRN

## 2013-07-23 NOTE — Care Management Note (Signed)
    Page 1 of 1   07/23/2013     11:19:45 AM   CARE MANAGEMENT NOTE 07/23/2013  Patient:  Juan Gates, Juan Gates   Account Number:  [id number]  Date Initiated:  07/23/2013  Documentation initiated by:  Lorenda Ishihara  Subjective/Objective Assessment:   57 yo male admitted s/p inguinal hernia repair. PTA lived at home alone.     Action/Plan:   Home when stable   Anticipated DC Date:  07/23/2013   Anticipated DC Plan:  HOME/SELF CARE      DC Planning Services  CM consult      Choice offered to / List presented to:             Status of service:  Completed, signed off Medicare Important Message given?   (If response is "NO", the following Medicare IM given date fields will be blank) Date Medicare IM given:   Date Additional Medicare IM given:    Discharge Disposition:  HOME/SELF CARE  Per UR Regulation:  Reviewed for med. necessity/level of care/duration of stay  If discussed at Long Length of Stay Meetings, dates discussed:    Comments:

## 2013-07-23 NOTE — Discharge Summary (Signed)
   Patient ID: Juan Gates [id number] y.o. 1956/09/18  07/22/2013  Discharge date and time: 07/23/2013   Admitting Physician: Glenna Fellows T  Discharge Physician: Glenna Fellows T  Admission Diagnoses: inguinal hernia  Discharge Diagnoses: same  Operations: Procedure(s): OPEN LEFT INGUINAL HERNIA REPAIR  INSERTION OF MESH  Admission Condition: good  Discharged Condition: good  Indication for Admission: patient is a 57 year old male with a long-standing gradually enlarging left inguinal hernia. He recently presented for evaluation and was found to have a very large chronically incarcerated left scrotal hernia. I've recommended open repair with overnight observation. The nature of the surgery and its indications and risks have been discussed in detailed elsewhere.Marland Kitchen He is admitted for this procedure.  Hospital Course: on the morning of admission the patient underwent open repair of a very large left scrotal hernia. The procedure went smoothly. His postoperative course was unremarkable. Pain was adequately controlled overnight. On the following morning he had moderate expected swelling in his left hemiscrotum. The incision is clean and healing well. Vital signs are all within normal limits. He is 42 without difficulty. Up and ambulatory.   Disposition: Home  Patient Instructions:    Medication List    STOP taking these medications       oxycodone 5 MG capsule  Commonly known as:  OXY-IR      TAKE these medications       aspirin 325 MG EC tablet  Take 1 tablet (325 mg total) by mouth daily.     diclofenac 75 MG EC tablet  Commonly known as:  VOLTAREN  Take 75 mg by mouth 2 (two) times daily as needed (for pain).     diphenhydrAMINE 25 mg capsule  Commonly known as:  BENADRYL  Take 50 mg by mouth at bedtime as needed for sleep.     lisinopril 5 MG tablet  Commonly known as:  PRINIVIL,ZESTRIL  Take 5 mg by mouth every morning.     LORazepam 0.5 MG  tablet  Commonly known as:  ATIVAN  Take 2 tablets (1 mg total) by mouth at bedtime as needed for anxiety.     metoprolol 50 MG tablet  Commonly known as:  LOPRESSOR  Take 50 mg by mouth 2 (two) times daily.     omega-3 acid ethyl esters 1 G capsule  Commonly known as:  LOVAZA  Take 1 g by mouth 2 (two) times daily.     oxyCODONE-acetaminophen 5-325 MG per tablet  Commonly known as:  PERCOCET/ROXICET  Take 1-2 tablets by mouth every 4 (four) hours as needed.     rosuvastatin 40 MG tablet  Commonly known as:  CRESTOR  Take 40 mg by mouth at bedtime.     TRILIPIX 45 MG capsule  Generic drug:  Choline Fenofibrate  Take 45 mg by mouth daily.        Activity: no heavy lifting for 6 weeks Diet: regular diet Wound Care: none needed  Follow-up:  With Dr. Nehemiah Settle were in 3 weeks.  Signed: Mariella Saa MD, FACS  07/23/2013, 11:11 AM

## 2013-07-23 NOTE — Progress Notes (Signed)
Patient discharged via wheelchair. Rx for percocet and ativan given. Patient does not want to wait to receive pnuemonia vaccine and states will get from PCP. States understanding of discharge instructions.

## 2013-07-29 ENCOUNTER — Telehealth (INDEPENDENT_AMBULATORY_CARE_PROVIDER_SITE_OTHER): Payer: Self-pay | Admitting: *Deleted

## 2013-07-29 NOTE — Telephone Encounter (Signed)
Patient called in to request a refill of pain medication.  Patient states he is still having swelling in the groin area.  Explained to patient that ice and elevation are the best things to help with swelling.  Patient states he is able to get up now without sharp shooting pains so it does sound as though patient is improving.  Explained to patient the protocol order of Norco 5/325mg  Take 1 tablet every 4-6 hours as needed for pain #30 no refills. Called into [facility] [phone]

## 2013-07-30 NOTE — Telephone Encounter (Signed)
The pt called in stating the Pharmacy told him yesterday that it was too early and he can't get the Vicodin.  He asked them when can he and they told him they would call him back.  They never did.  I put him on hold and called [facility] [phone] and they said it is ready and he can get it.

## 2013-08-02 ENCOUNTER — Encounter: Payer: Self-pay | Admitting: Cardiology

## 2013-08-02 ENCOUNTER — Ambulatory Visit (INDEPENDENT_AMBULATORY_CARE_PROVIDER_SITE_OTHER): Payer: Medicaid Other | Admitting: Cardiology

## 2013-08-02 VITALS — BP 100/70 | HR 80 | Ht 73.5 in | Wt 221.4 lb

## 2013-08-02 DIAGNOSIS — E7800 Pure hypercholesterolemia, unspecified: Secondary | ICD-10-CM

## 2013-08-02 DIAGNOSIS — Z951 Presence of aortocoronary bypass graft: Secondary | ICD-10-CM

## 2013-08-02 DIAGNOSIS — F17200 Nicotine dependence, unspecified, uncomplicated: Secondary | ICD-10-CM

## 2013-08-02 MED ORDER — TADALAFIL 20 MG PO TABS: ORAL_TABLET | ORAL | Status: DC

## 2013-08-02 NOTE — Assessment & Plan Note (Signed)
We supplied some Crestor samples

## 2013-08-02 NOTE — Patient Instructions (Addendum)
Cialis 20 mg 1/2 as needed. STOP SMOKING. Do not take Cialis with NTG

## 2013-08-02 NOTE — Addendum Note (Signed)
Addended by: Abelino Derrick on: 08/02/2013 04:14 PM   Modules accepted: Orders

## 2013-08-02 NOTE — Progress Notes (Signed)
08/02/2013 Juan Gates   August 29, 1956  [id number]  Primary Physicia Dorrene German, MD Primary Cardiologist: Dr Tresa Endo  HPI:  57 y/o with a history of CAD, s/p CABG Jan 2014. He has had issues with affording his medications but that seems to have stabilized. He is here today with questions about exercise and medication. He says he has been doing 20 push ups a day and walking with 30 lb weights trying to get his HR as high as possible to "help lower my cholesterol" He also wanted some Cialis samples.   Current Outpatient Prescriptions  Medication Sig Dispense Refill  . aspirin EC 325 MG EC tablet Take 1 tablet (325 mg total) by mouth daily.  30 tablet    . Choline Fenofibrate (TRILIPIX) 45 MG capsule Take 45 mg by mouth daily.      . diphenhydrAMINE (BENADRYL) 25 mg capsule Take 50 mg by mouth at bedtime as needed for sleep.       Marland Kitchen lisinopril (PRINIVIL,ZESTRIL) 5 MG tablet Take 5 mg by mouth every morning.      Marland Kitchen LORazepam (ATIVAN) 0.5 MG tablet Take 2 tablets (1 mg total) by mouth at bedtime as needed for anxiety.  30 tablet  0  . metoprolol (LOPRESSOR) 50 MG tablet Take 50 mg by mouth 2 (two) times daily.      Marland Kitchen omega-3 acid ethyl esters (LOVAZA) 1 G capsule Take 1 g by mouth 2 (two) times daily.      . rosuvastatin (CRESTOR) 40 MG tablet Take 40 mg by mouth at bedtime.       No current facility-administered medications for this visit.    No Known Allergies  History   Social History  . Marital Status: Divorced    Spouse Name: N/A    Number of Children: N/A  . Years of Education: N/A   Occupational History  . Not on file.   Social History Main Topics  . Smoking status: Current Every Day Smoker -- 1.00 packs/day for 20 years    Types: Cigarettes  . Smokeless tobacco: Never Used  . Alcohol Use: Yes     Comment: beers "every now and then"  . Drug Use: No  . Sexually Active: Not on file   Other Topics Concern  . Not on file   Social History Narrative  . No narrative on  file     Review of Systems: General: negative for chills, fever, night sweats or weight changes.  Cardiovascular: negative for chest pain, dyspnea on exertion, edema, orthopnea, palpitations, paroxysmal nocturnal dyspnea or shortness of breath Dermatological: negative for rash Respiratory: negative for cough or wheezing Urologic: negative for hematuria Abdominal: negative for nausea, vomiting, diarrhea, bright red blood per rectum, melena, or hematemesis Neurologic: negative for visual changes, syncope, or dizziness All other systems reviewed and are otherwise negative except as noted above.    Blood pressure 100/70, pulse 80, height 6' 1.5" (1.867 m), weight 221 lb 6.4 oz (100.426 kg).  General appearance: alert, cooperative and no distress   ASSESSMENT AND PLAN:   S/P CABG x 2, (LIMA-LAD and RIMA-OM) 01/03/13 Doing well, no angina  Hypercholesterolemia We supplied some Crestor samples  Smoker He continues to smoke, we have had lengthy discussion about this previously   PLAN  I encouraged [name] to modify his exercise and not try and get his HR as high as possible. I suggested an exercise bike or pre cor machine for 30 minutes. I gave some Cialis samples with instructions not  to take with NTG (he has no NTG).   Jennilee Demarco KPA-C 08/02/2013 4:01 PM

## 2013-08-02 NOTE — Assessment & Plan Note (Signed)
He continues to smoke, we have had lengthy discussion about this previously

## 2013-08-02 NOTE — Assessment & Plan Note (Signed)
Doing well, no angina 

## 2013-08-15 ENCOUNTER — Encounter (INDEPENDENT_AMBULATORY_CARE_PROVIDER_SITE_OTHER): Payer: Self-pay | Admitting: General Surgery

## 2013-08-15 ENCOUNTER — Ambulatory Visit (INDEPENDENT_AMBULATORY_CARE_PROVIDER_SITE_OTHER): Payer: Medicaid Other | Admitting: General Surgery

## 2013-08-15 VITALS — BP 112/78 | HR 88 | Temp 97.7°F | Resp 16 | Ht 73.0 in | Wt 213.6 lb

## 2013-08-15 DIAGNOSIS — Z09 Encounter for follow-up examination after completed treatment for conditions other than malignant neoplasm: Secondary | ICD-10-CM

## 2013-08-15 MED ORDER — HYDROCODONE-ACETAMINOPHEN 5-325 MG PO TABS: 1.0000 | ORAL_TABLET | ORAL | Status: DC | PRN

## 2013-08-15 NOTE — Patient Instructions (Addendum)
No lifting over 20 pounds for the next 3 weeks.

## 2013-08-15 NOTE — Progress Notes (Signed)
History: Patient returns 3 weeks following open repair of a very large left scrotal hernia. He is basically getting along pretty well. He had a lot of swelling of his left hemiscrotum postoperatively which we discussed this not unexpected. This has been getting gradually better. He still gets some aching discomfort in his left scrotum after being on his feet all day long but this is also gradually improving.  Exam: BP 112/78  Pulse 88  Temp(Src) 97.7 F (36.5 C) (Temporal)  Resp 16  Ht 6\' 1"  (1.854 m)  Wt 213 lb 9.6 oz (96.888 kg)  BMI 28.19 kg/m2  SpO2 98% General: Appears well Incision: Left groin incision well healed without infection. There is still a lot of firm swelling in the left scrotum but it is probably down to 50% from early postop period. this does not yet feel like fluid.   Assessment plan: Doing well follow repair of left scrotal hernia. He has expected swelling or hematoma in the left hemiscrotum it is gradually improving. I refilled his prescription for hydrocodone #50. We discussed activity limitations. Return in one month.

## 2013-08-16 ENCOUNTER — Telehealth: Payer: Self-pay | Admitting: Cardiology

## 2013-08-16 NOTE — Telephone Encounter (Signed)
Would like to know his blood type °

## 2013-08-16 NOTE — Telephone Encounter (Signed)
Returned call.  Pt requesting to know his blood type b/c he is reading a book about eating healthy for your blood type.  Pt informed blood typing is not usually done as an office lab.  Pt stated he had surgery in January and should have had it typed then.  Reviewed labs and Type and Screen was completed 1.8.14.  Pt identified x 4 and informed blood type is B Pos per result.  Verbalized understanding.

## 2013-09-11 ENCOUNTER — Encounter (INDEPENDENT_AMBULATORY_CARE_PROVIDER_SITE_OTHER): Payer: Self-pay | Admitting: General Surgery

## 2013-09-11 ENCOUNTER — Ambulatory Visit (INDEPENDENT_AMBULATORY_CARE_PROVIDER_SITE_OTHER): Payer: Medicaid Other | Admitting: General Surgery

## 2013-09-11 VITALS — BP 134/68 | HR 72 | Temp 97.2°F | Resp 14 | Ht 73.0 in | Wt 204.4 lb

## 2013-09-11 DIAGNOSIS — Z09 Encounter for follow-up examination after completed treatment for conditions other than malignant neoplasm: Secondary | ICD-10-CM

## 2013-09-11 NOTE — Progress Notes (Signed)
History: Patient returns for long-term followup if a repair of his large left scrotal hernia. He has had a significant swelling/hematoma in the left hemiscrotum. He states it is not giving him any discomfort and is gradually getting smaller. He is returned to completely normal activity without discomfort.  Exam: Left inguinal incision well healed. There is no evidence of recurrent hernia. There is still some thickening to the cord and approximately an 8-9 cm area of firmness swelling in the left hemiscrotum which is improved from last visit. No definite fluid which could be aspirated on exam.  Assessment and plan: Improving after repair of large left scrotal hernia. Return in 3 months.

## 2013-10-14 IMAGING — CR DG CHEST 2V
2 series · 2 of 2 positions shown · non-contrast
Comparison: 01/06/2013

CLINICAL DATA: Shortness of breath.  Pain.

CHEST - 2 VIEW

[w chest pa]
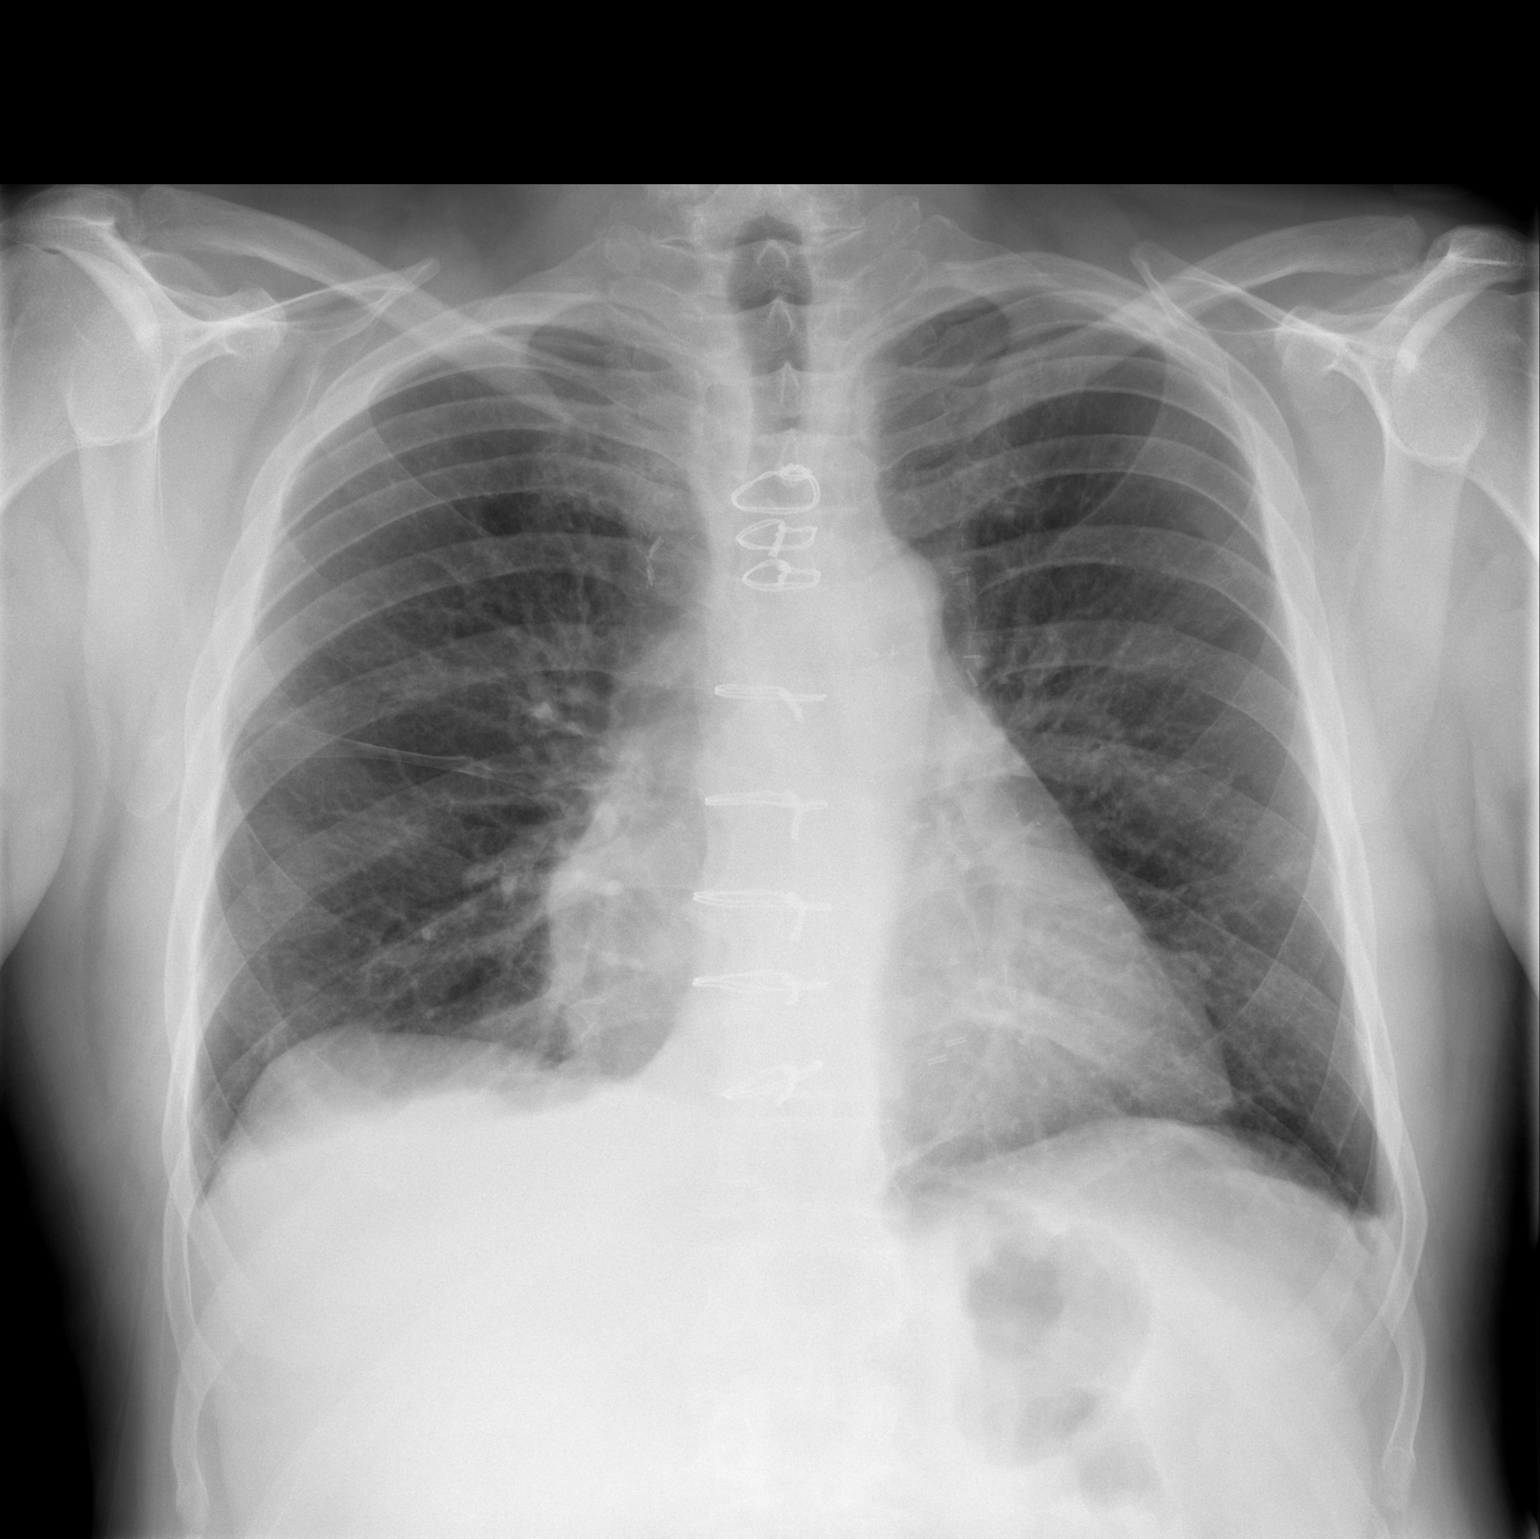

[w chest lat]
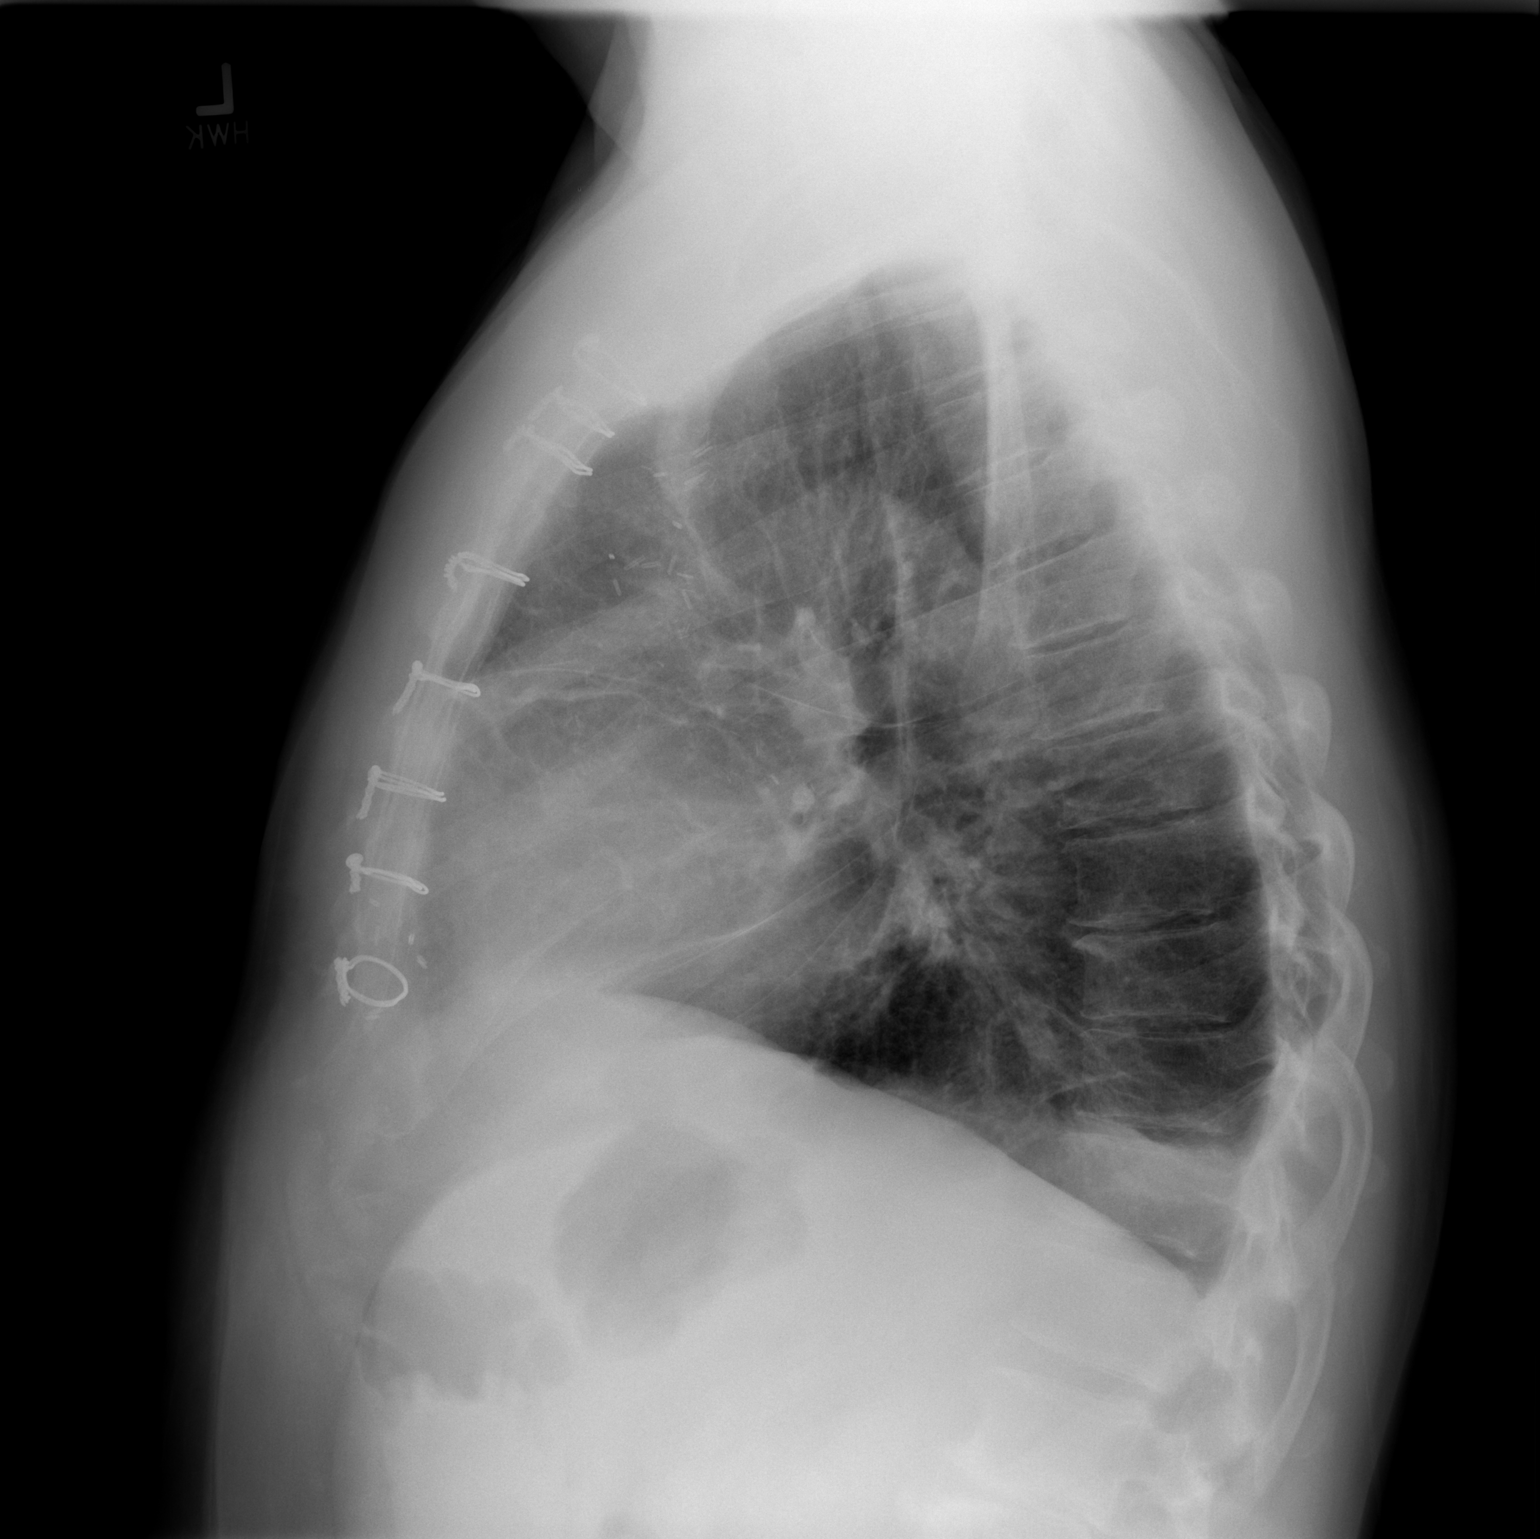

[2 of 2 positions shown; findings below may reference images not displayed]

FINDINGS: No focal consolidation or pulmonary edema.  Probable tiny
left pleural effusion. Cardiopericardial silhouette is at upper
limits of normal for size.  The patient is status post median
sternotomy. Imaged bony structures of the thorax are intact.
IMPRESSION: Tiny left pleural effusion.  Otherwise no acute findings.

## 2013-11-18 ENCOUNTER — Telehealth: Payer: Self-pay | Admitting: Cardiology

## 2013-11-18 NOTE — Telephone Encounter (Signed)
Returned call and pt verified x 2.  Pt informed message received.  RN asked if he contacted PCP for referral.  Pt stated he hadn't b/c they take a long time to do anything and he didn't want to deal w/ them.  Stated he was told he could have a referral from any MD.  Pt informed if referral generated from our office, we would be responsible for following the care for it and our office takes care of his heart.  Informed his ears/hearing would be followed by PCP.  Pt verbalized understanding and agreed w/ plan.

## 2013-11-18 NOTE — Telephone Encounter (Signed)
:  Please call-need a referral to a hearing specialist please.

## 2013-12-09 ENCOUNTER — Encounter: Payer: Self-pay | Admitting: Cardiovascular Disease

## 2013-12-09 ENCOUNTER — Ambulatory Visit (INDEPENDENT_AMBULATORY_CARE_PROVIDER_SITE_OTHER): Payer: Medicaid Other | Admitting: Cardiovascular Disease

## 2013-12-09 VITALS — BP 110/70 | HR 72 | Ht 73.0 in | Wt 207.2 lb

## 2013-12-09 DIAGNOSIS — E782 Mixed hyperlipidemia: Secondary | ICD-10-CM

## 2013-12-09 DIAGNOSIS — E7800 Pure hypercholesterolemia, unspecified: Secondary | ICD-10-CM

## 2013-12-09 DIAGNOSIS — Z951 Presence of aortocoronary bypass graft: Secondary | ICD-10-CM

## 2013-12-09 DIAGNOSIS — I2510 Atherosclerotic heart disease of native coronary artery without angina pectoris: Secondary | ICD-10-CM

## 2013-12-09 DIAGNOSIS — I214 Non-ST elevation (NSTEMI) myocardial infarction: Secondary | ICD-10-CM

## 2013-12-09 DIAGNOSIS — R5381 Other malaise: Secondary | ICD-10-CM

## 2013-12-09 DIAGNOSIS — F17200 Nicotine dependence, unspecified, uncomplicated: Secondary | ICD-10-CM

## 2013-12-09 DIAGNOSIS — Z23 Encounter for immunization: Secondary | ICD-10-CM

## 2013-12-09 NOTE — Patient Instructions (Addendum)
Your physician recommends that you schedule a follow-up appointment in: 6 MONTHS.  Your physician recommends that you return for lab work fasting.
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
## 2013-12-10 ENCOUNTER — Ambulatory Visit: Payer: Medicaid Other | Attending: Internal Medicine | Admitting: Audiology

## 2013-12-10 DIAGNOSIS — H93213 Auditory recruitment, bilateral: Secondary | ICD-10-CM

## 2013-12-10 DIAGNOSIS — H903 Sensorineural hearing loss, bilateral: Secondary | ICD-10-CM

## 2013-12-10 DIAGNOSIS — H93293 Other abnormal auditory perceptions, bilateral: Secondary | ICD-10-CM

## 2013-12-10 DIAGNOSIS — H93219 Auditory recruitment, unspecified ear: Secondary | ICD-10-CM | POA: Insufficient documentation

## 2013-12-10 DIAGNOSIS — H93299 Other abnormal auditory perceptions, unspecified ear: Secondary | ICD-10-CM | POA: Insufficient documentation

## 2013-12-10 NOTE — Procedures (Signed)
Outpatient Rehabilitation and [facility] [address] [place], [place] [address] [phone]  AUDIOLOGICAL EVALUATION  Name: Juan Gates DOB:  [birth year] MRN:  [id number]                              Diagnosis: hearing loss Date: 12/10/2013   Referent: Juan German, MD  HISTORY: Juan Gates, age 57 y.o. years, was seen for an audiological evaluation.  He reports "constant high pitched tinnitus" that is adversely affecting his life. On a scale of 1 ( no impact) to 10 (ruined) he rates the effect of the tinnitus as a 5.  The "hearing loss that makes it difficult to hear people talking" and Juan Gates states that " people are always telling me I can't hear" and that "people are not always patient when I don't hear them" in person or on the phone.  The hearing loss has adversely affected him at work and socially. Juan Gates reports that one on one conversation in a quiet room with the speaker close to an facing him is ok, but "the minute they turn their head or walk away" he no longer hears them.  He has noticed a gradual hearing loss over the past "15 years".   Juan Gates reports having had a "heart attack with bipass surgery in 2013 that has been finacially exhausting.  He denies vertigo or balance issues.  Juan Gates also reports a paternal family history of hearing loss.Juan Gates reports being exposed to occupational noise as well as "gunfire, power tools, Actuary".         EVALUATION: Pure tone air and tone conduction was completed using conventional audiometry with inserts. Hearing thresholds range from 15-25 dBHL from 125Hz  - 250Hz ; 30-35 dBHL at 500Hz ; 50-60 dBHL from 750Hz  - 2000Hz  and 65-70 dBHL from 4000Hz  - 8000Hz  bilaterally. Speech reception thresholds are  50 dBHL in the right ear and 50 dBHL in the left ear using recorded spondee words.  The reliability is good. Word recognition is 88% at 85dBHL in the right and 96% at  85dBHL in the left using recorded NU-6 word lists in quiet. In minimal background noise with +5dB signal to noise ratio word recogntion is 50 % in the right ear and 32% in the left ear. Otoscopic inspection reveals clear ear canals with visible tympanic membranes. Tympanometry showed in the right ear was (Type A)  and in the left ear was (Type A).  Uncomfortable Loudness Levels are 110 dBHL bilaterally.  Tinnitus is "high pitched, difficult to describe and constant - worse in the morning".   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  Word recognition is excellent in the left and very good on the right  in quiet at the volume of shouting. It is not expected that Juan Gates would hear much at normal conversional speech levels without lipreading, being in quiet and facing the speaker.  In minimal background noise, word recognition drops to very poor in each ear. Poor hearing in background noise may cause missed instructions, create misunderstanding or unclear speech perception that may adversely affect speech perception. The is also some recruitment evident supporting the sensorineural hearing loss.  Juan Gates is an excellent candidate for amplification therefore a hearing aid evaluation is recommended.  Amplification helps make the signal  louder and therefore often improves hearing and word recognition.  Amplification has many forms including hearing aids in one or both ears, an assistive listening device which have a microphone and speaker such as a small handheld device and/or even a surround sound system of speakers.  Amplification is generally not covered by insurance.  It is important to note that hearing aids must be individually fit according to the hearing test results and the ear shape.  Audiologists and hearing aid dealers in [place] must be licensed in  order to dispense hearing aids.  In addition, a trial period is mandated by law in our state because often amplification must be tried and then evaluated in order to determine benefit. It is important to note that Juan Gates states he has a hearing aid that was his mother's that may be fit to him. An audiologist who dispenses hearing aids would need to see the hearing aid and his audiogram in order to determine whether this is an option for him.   In summary, Juan Gates has a significant hearing loss that will prevent conversation at normal conversational speech levels without lip reading and/or being in quiet.  He needs amplification - especially if he is considering employment because Juan Gates is aware of his hearing limitations and finds it very embarrassing to misunderstand or not realize that someone is talking to him. Marland Kitchen     RECOMMENDATIONS: 1.   Monitor hearing closely and schedule a repeat audiological evaluation if there is any change in hearing or ear pressure.  2.   Consider further evaluation of the tinnitus by an Ear, Nose and Throat physician, especially if the tinnitus changes in pitch, frequency or loudness. In addition consider tinnitus masking and or amplification at the location of Juan Gates choice. Please see the list below which includes [facility] who work with a state agency that provides one hearing aid to Consulting civil engineer.  [facility] (as well as some others) also work with the [employer].  Vocational Rehabilitation may be a source of hearing aids since the hearing loss is adversely affecting Juan Gates's ability to work.  3. To minimize the adverse effects of tinnitus 1) avoid quiet  2) use noise maskers at home such as a sound machine, quiet music, a fan or other background noise at a volume just loud enough to mask the high pitched tinnitus. 3) If the tinnitus becomes more bothersome, adversely affecting your sleep or concentration, contact your  physician,  seek additional medical help by an ENT for further treatment of your tinnitus.   4.   Strategies that help improve hearing include:  A) Face the speaker directly. Optimal is having the speakers face well - lit.  Unless amplified, being within 3-6 feet of the speaker  will enhance word recognition.  B) Avoid having the speaker back-lit as this will minimize the ability to use cues from lip-reading, facial expression and gestures.  C)  Word recognition is poorer in background noise. For optimal word recognition, turn off the TV, radio or noisy fan when engaging  in conversation. In a restaurant, try to sit away from noise sources and close to the primary speaker.   D)  Ask for topic clarification from time to time in order to remain in the conversation.  Most people don't mind repeating or clarifying  a point when asked.  If needed, explain the difficulty hearing in background noise or hearing loss.  5. Environmental consultant in [place] may  help with obtaining one hearing aid or one captioned telephone if financial qualifications are met.  Please contact Miss Jarold Motto at [phone] ext [phone]. List of Providers for [employer] [facility] June 25, 2012 - June 24, 2014. The following hearing aid companies have contracted with [facility] to fit hearing aids for eligible applicants. Representatives of these companies are not [facility] employees. Inclusion on this list means the company agrees to the terms of contract pricing and inclusion is not an endorsement of their services over those who are not contracted with the division. An applicant may choose any provider listed from your region to assist in obtaining hearing aids through this service. If problems arise during this process, please contact the [facility] that provided. the application.  [facility] Vendor List 1 Updated; April 22, 2013 [facility] [address]  [place], [place] [phone] [facility]  [place]  [phone] and [place]  [phone] [facility] & [place] [phone] [facility] [place] [phone], [phone]  [facility] & [facility]  [phone]   Gavin Pound L. Kate Sable, Au.D., CCC-A Doctor of Audiology 12/10/2013

## 2013-12-10 NOTE — Patient Instructions (Addendum)
Amplification helps make the signal louder and therefore often improves hearing and word recognition.  Amplification has many forms including hearing aids in one or both ears, an assistive listening device which have a microphone and speaker such as a small handheld device and/or even a surround sound system of speakers.  Amplification may be covered by some insurances, but not all.  It is important to note that hearing aids must be individually fit according to the hearing test results and the ear shape.  Audiologists and hearing aid dealers in [place] must be licensed in order to dispense hearing aids.  In addition, a trial period is mandated by law in our state because often amplification must be tried and then evaluated in order to determine benefit.       There are many excellent choices when it comes to amplification in our area and providers are listed in the phone book under hearing aids, may be affiliated with Ear, Nose and Throat physicians, are located at [facility] and [employer] as well as the [employer] speech and hearing center.  Strategies that help improve hearing include: A) Face the speaker directly. Optimal is having the speakers face well - lit.  Unless amplified, being within 3-6 feet of the speaker will enhance word recognition. B) Avoid having the speaker back-lit as this will minimize the ability to use cues from lip-reading, facial expression and gestures. C)  Word recognition is poorer in background noise. For optimal word recognition, turn off the TV, radio or noisy fan when engaging in conversation. In a restaurant, try to sit away from noise sources and close to the primary speaker.  D)  Ask for topic clarification from time to time in order to remain in the conversation.  Most people don't mind repeating or clarifying a point when asked.  If needed, explain the difficulty hearing in background noise or hearing loss.  Glean Salvo

## 2014-01-03 ENCOUNTER — Encounter (INDEPENDENT_AMBULATORY_CARE_PROVIDER_SITE_OTHER): Payer: Self-pay | Admitting: General Surgery

## 2014-01-03 ENCOUNTER — Ambulatory Visit (INDEPENDENT_AMBULATORY_CARE_PROVIDER_SITE_OTHER): Payer: Medicaid Other | Admitting: General Surgery

## 2014-01-03 ENCOUNTER — Ambulatory Visit: Payer: Medicaid Other | Admitting: Audiology

## 2014-01-03 VITALS — BP 123/83 | HR 60 | Temp 97.7°F | Resp 14 | Ht 73.0 in | Wt 207.4 lb

## 2014-01-03 DIAGNOSIS — Z09 Encounter for follow-up examination after completed treatment for conditions other than malignant neoplasm: Secondary | ICD-10-CM

## 2014-01-03 NOTE — Progress Notes (Signed)
History: Patient returns for more long-term followup after repair of his large right scrotal hernia. At this point he feels well. Selectivity without discomfort.  Exam: The left scrotal swelling hematomas now completely resolved. Wound well-healed. No evidence of recurrent hernia.  Assessment and plan: Doing well following repair of large left scrotal hernia. He is discharged return as needed.

## 2014-01-06 ENCOUNTER — Other Ambulatory Visit: Payer: Self-pay | Admitting: *Deleted

## 2014-01-06 MED ORDER — CHOLINE FENOFIBRATE 45 MG PO CPDR: 45.0000 mg | DELAYED_RELEASE_CAPSULE | Freq: Every day | ORAL | Status: DC

## 2014-01-06 NOTE — Telephone Encounter (Signed)
Rx was sent to pharmacy electronically. 

## 2014-01-14 ENCOUNTER — Other Ambulatory Visit: Payer: Self-pay | Admitting: *Deleted

## 2014-01-14 ENCOUNTER — Telehealth: Payer: Self-pay | Admitting: Cardiovascular Disease

## 2014-01-14 MED ORDER — CHOLINE FENOFIBRATE 45 MG PO CPDR: 45.0000 mg | DELAYED_RELEASE_CAPSULE | Freq: Every day | ORAL | Status: DC

## 2014-01-14 NOTE — Telephone Encounter (Signed)
Message forwarded to W. Mordecai MaesWaddell, CMA for PA.

## 2014-01-14 NOTE — Telephone Encounter (Signed)
Need prior authorization for his Trilipix 25 mg.Please call this number to give prior [phone].

## 2014-01-14 NOTE — Telephone Encounter (Signed)
Called PA line # given wrong #. Will await denial from pharmacy.

## 2014-01-14 NOTE — Telephone Encounter (Signed)
Message forwarded to K. Alvstad, PharmD to review in Dr. Landry DykeKelly's absence.

## 2014-01-14 NOTE — Telephone Encounter (Signed)
His insurance will no longer pay for Tri;ipix. Need a new prescription for a medicine to replqce Trilipix. Please call or send this to [facility] [address]

## 2014-01-20 ENCOUNTER — Ambulatory Visit (INDEPENDENT_AMBULATORY_CARE_PROVIDER_SITE_OTHER): Payer: Medicaid Other | Admitting: Thoracic Surgery (Cardiothoracic Vascular Surgery)

## 2014-01-20 ENCOUNTER — Encounter: Payer: Self-pay | Admitting: Thoracic Surgery (Cardiothoracic Vascular Surgery)

## 2014-01-20 VITALS — BP 136/84 | HR 65 | Resp 20 | Ht 73.0 in | Wt 225.0 lb

## 2014-01-20 DIAGNOSIS — Z951 Presence of aortocoronary bypass graft: Secondary | ICD-10-CM

## 2014-01-20 DIAGNOSIS — I2510 Atherosclerotic heart disease of native coronary artery without angina pectoris: Secondary | ICD-10-CM

## 2014-01-20 NOTE — Progress Notes (Signed)
      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 His postoperative recovery has been uncomplicated. Since hospital discharge the patient has continued to recover uneventfully. Unfortunately he has already gone back to smoking cigarettes. He has had no exertional angina.  He has had no shortness of breath. His activity level is very good. He has returned to work but remains on light duty-limited not by his heart but by his joints. He denies cough. He sees Dr. Tresa EndoKelly routinely for medical management.    Current Outpatient Prescriptions  Medication Sig Dispense Refill  . aspirin EC 325 MG EC tablet Take 1 tablet (325 mg total) by mouth daily.  30 tablet    . Choline Fenofibrate (TRILIPIX) 45 MG capsule Take 1 capsule (45 mg total) by mouth daily.  30 capsule  11  . lisinopril (PRINIVIL,ZESTRIL) 5 MG tablet Take 5 mg by mouth every morning.      . metoprolol (LOPRESSOR) 50 MG tablet Take 50 mg by mouth 2 (two) times daily.      Marland Kitchen. omega-3 acid ethyl esters (LOVAZA) 1 G capsule Take 1 g by mouth 2 (two) times daily.      . simvastatin (ZOCOR) 40 MG tablet Take 40 mg by mouth every evening.       No current facility-administered medications for this visit.      Physical Exam:   BP 136/84  Pulse 65  Resp 20  Ht 6\' 1"  (1.854 m)  Wt 225 lb (102.059 kg)  BMI 29.69 kg/m2  SpO2 98%  General:  Well appearing, in no acute distress  Chest:   Clear to auscultation  CV:   Regular rate and rhythm without murmur  Incisions:  Healing nicely, clean and dry  Abdomen:  Soft and nontender  Extremities:  Warm and well-perfused  Diagnostic Tests:  n/a   Impression:  Patient is doing very well from a cardiovascular standpoint 1 year s/p  CABG.   He has no exertional angina or shortness of breath.  He has not had any late cardiovascular events. He continues to followup with his cardiologist a regular basis and he is taking appropriate medical therapy. Unfortunately he is still smoking.  He continues to work on smoking cessation.   Plan:  Discussed medical management of smoking cessation-defer to Dr. Tresa EndoKelly for prescription  Discussed current medications-defer to Dr. Tresa EndoKelly for refill on Trilipix 45mg .  Patient understands that exercise is important in the recovery process.   Jari Favreessa Conte, PA-S   I have seen and examined the patient and agree with the assessment and plan as outlined.     Salvatore Decentlarence H. Cornelius Moraswen, MD 01/20/2014 3:23 PM

## 2014-01-20 NOTE — Patient Instructions (Signed)
The patient should make every effort to stop smoking immediately and permanently.  

## 2014-02-12 ENCOUNTER — Other Ambulatory Visit: Payer: Self-pay | Admitting: *Deleted

## 2014-02-12 MED ORDER — LISINOPRIL 5 MG PO TABS: 5.0000 mg | ORAL_TABLET | Freq: Every morning | ORAL | Status: DC

## 2014-02-12 NOTE — Telephone Encounter (Signed)
Rx was sent to pharmacy electronically. 

## 2014-03-18 ENCOUNTER — Other Ambulatory Visit: Payer: Self-pay

## 2014-03-18 MED ORDER — LISINOPRIL 5 MG PO TABS: 5.0000 mg | ORAL_TABLET | Freq: Every morning | ORAL | Status: DC

## 2014-03-18 NOTE — Telephone Encounter (Signed)
Rx was sent to pharmacy electronically. 

## 2014-05-21 ENCOUNTER — Other Ambulatory Visit: Payer: Self-pay | Admitting: Cardiovascular Disease

## 2014-05-22 NOTE — Telephone Encounter (Signed)
Rx refill sent to patient pharmacy   

## 2014-06-11 ENCOUNTER — Telehealth: Payer: Self-pay | Admitting: Cardiovascular Disease

## 2014-06-11 NOTE — Telephone Encounter (Signed)
Please call,he was out in the sun yesterday.He got very dehydrated, he drink  plenty of liquids. Last night he had cramps in his legs and his toe. Today when he walks, his leg still hurts.

## 2014-06-11 NOTE — Telephone Encounter (Signed)
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 He reports cramping in bilateral legs and toe, reporting he had a cramp in his left thigh and left big toe at same time while lying in bed. Patient reports he drank gatorade and water, alternating, and took vitamin b12 supplement. He reports his left leg still hurts/is sore when he walks today. He was concerned the situation was his heart. RN informed him it appears his situation is r/t being dehydrated and advised him to maintain good hydration and consume foods with potassium to help prevent cramping. Patient voiced understanding.

## 2014-09-22 ENCOUNTER — Telehealth: Payer: Self-pay | Admitting: Pharmacist Clinician (PhC)/ Clinical Pharmacy Specialist

## 2014-09-22 NOTE — Telephone Encounter (Signed)
Pt called LMOM asking to return call regarding medication question.  Returned call LMOM, pt called back within 10 minutes.  He takes metoprolol tart  bid, his sister has recently stopped taking metoprolol .  Wanted to know if okay to break her tablets in 1/2  Advised pt to check label to make sure her dose was tartrate not succinate.  If so, okay to break in 1/2.  If not, pt to call back for further dosing instructions.  Pt voiced understanding.

## 2014-11-11 ENCOUNTER — Ambulatory Visit (INDEPENDENT_AMBULATORY_CARE_PROVIDER_SITE_OTHER): Payer: Medicaid Other | Admitting: Cardiovascular Disease

## 2014-11-11 ENCOUNTER — Encounter: Payer: Self-pay | Admitting: Cardiovascular Disease

## 2014-11-11 VITALS — BP 130/84 | HR 84 | Ht 73.5 in | Wt 201.0 lb

## 2014-11-11 DIAGNOSIS — I2510 Atherosclerotic heart disease of native coronary artery without angina pectoris: Secondary | ICD-10-CM

## 2014-11-11 DIAGNOSIS — E7800 Pure hypercholesterolemia, unspecified: Secondary | ICD-10-CM

## 2014-11-11 DIAGNOSIS — E785 Hyperlipidemia, unspecified: Secondary | ICD-10-CM

## 2014-11-11 DIAGNOSIS — Z720 Tobacco use: Secondary | ICD-10-CM

## 2014-11-11 DIAGNOSIS — F17200 Nicotine dependence, unspecified, uncomplicated: Secondary | ICD-10-CM

## 2014-11-11 DIAGNOSIS — Z951 Presence of aortocoronary bypass graft: Secondary | ICD-10-CM

## 2014-11-11 NOTE — Progress Notes (Signed)
Patient ID: Juan Gates, male   DOB: 03/04/1956, 58 y.o.   MRN: [id number]     HPI: Juan Gates is a 58 y.o. male who presents for a one-year follow-up cardiology evaluation.  [name] has a significant history of tobacco use.  He suffered a NSTEMI and underwent cardiac catheterization by Dr. Herbie BaltimoreHarding on 12/31/2012.  On 10/03/2013 he underwent CABG surgery x2 by Dr. Barry Dieneswens with a right internal mammary artery to the obtuse marginal, and left internal mammary artery to the LAD. He had preserved LV function. He also had hyperlipidemia. Unfortunately the patient has continued to smoke cigarettes.  I last saw him in December 2014.  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  He is tentatively scheduled to undergo surgery in February.  He does have discomfort in his leg with activity and is unable to walk up hills.  He has been taking lisinopril 5 mg and Lopressor 50 g twice a day.  Following his myocardial infarction.  He has been on Zocor 40 g daily for hyperlipidemia.  He has been taking taking a full dose 325 mg of aspirin.  He presents for one-year evaluation.    He recently obtained lab work by Dr. Concepcion ElkAvbuere.    Past Medical History  Diagnosis Date  . 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 Hyperlipidemia   . Hypertension   . Coronary artery disease   . Myocardial infarction Jan. 6 2014  . Anxiety   . Arthritis     knees, right hip  . Complication of anesthesia     "started fighting when woke up with tube down throat"    Past Surgical History  Procedure Laterality Date  . Coronary artery bypass graft  01/03/2013    Procedure: CORONARY ARTERY BYPASS GRAFTING (CABG);  Surgeon: Purcell Nailslarence H Owen, MD;  [facility];  Service: Open Heart Surgery;  Laterality: N/A;  . Intraoperative transesophageal echocardiogram  01/03/2013    Procedure: INTRAOPERATIVE TRANSESOPHAGEAL ECHOCARDIOGRAM;  Surgeon:  Purcell Nailslarence H Owen, MD;  [facility];  Service: Open Heart Surgery;  Laterality: N/A;  . Cardiac catheterization  12/31/2012    2 vessel cors 90%hazy stenosis prox circ with long segment 60-70%LAD,mod RCA,severe lesion small marginal branch  . Inguinal hernia repair Left 07/22/2013    Procedure: OPEN LEFT INGUINAL HERNIA REPAIR ;  Surgeon: Mariella SaaBenjamin T Hoxworth, MD;  Location: [facility] ORS;  Service: General;  Laterality: Left;  . Insertion of mesh Left 07/22/2013    Procedure: INSERTION OF MESH;  Surgeon: Mariella SaaBenjamin T Hoxworth, MD;  Location: [facility];  Service: General;  Laterality: Left;  . Hernia repair  07/22/13    open RIH    No Known Allergies  Current Outpatient Prescriptions  Medication Sig Dispense Refill  . aspirin EC 325 MG EC tablet Take 1 tablet (325 mg total) by mouth daily. 30 tablet   . Choline Fenofibrate (TRILIPIX) 45 MG capsule Take 1 capsule (45 mg total) by mouth daily. 30 capsule 11  . lisinopril (PRINIVIL,ZESTRIL) 5 MG tablet Take 1 tablet (5 mg total) by mouth every morning. 30 tablet 10  . metoprolol (LOPRESSOR) 50 MG tablet TAKE ONE TABLET BY MOUTH TWICE DAILY 60 tablet 5  . omega-3 acid ethyl esters (LOVAZA) 1 G capsule Take 1 g by mouth 2 (two) times daily.    . simvastatin (ZOCOR) 40  MG tablet Take 40 mg by mouth every evening.     No current facility-administered medications for this visit.    History   Social History  . Marital Status: Divorced    Spouse Name: N/A    Number of Children: N/A  . Years of Education: N/A   Occupational History  . Not on file.   Social History Main Topics  . Smoking status: Current Every Day Smoker -- 1.00 packs/day for 20 years    Types: Cigarettes  . Smokeless tobacco: Never Used  . Alcohol Use: Yes     Comment: beers "every now and then"  . Drug Use: No  . Sexual Activity: Not on file   Other Topics Concern  . Not on file   Social History Narrative    Family History  Problem Relation Age of Onset  . Hypertension  Mother   . Cancer Father     bone cancer  . Cancer Brother     metastatic melanoma   Additional social history is notable that he is single. He has 3 children. He smoking a pack per day.  ROS General: Negative; No fevers, chills, or night sweats;  HEENT: Negative; No changes in vision or hearing, sinus congestion, difficulty swallowing Pulmonary: Negative; No cough, wheezing, shortness of breath, hemoptysis Cardiovascular: Negative; No chest pain, presyncope, syncope, palpitations GI: Negative; No nausea, vomiting, diarrhea, or abdominal pain GU: Negative; No dysuria, hematuria, or difficulty voiding Musculoskeletal: Positive for left leg discomfort.  She'll be treated by his lipoma of his left thigh; no myalgias, joint pain, or weakness Hematologic/Oncology: Negative; no easy bruising, bleeding Endocrine: Negative; no heat/cold intolerance; no diabetes Neuro: Negative; no changes in balance, headaches Skin: Negative; No rashes or skin lesions Psychiatric: Negative; No behavioral problems, depression Sleep: Negative; No snoring, daytime sleepiness, hypersomnolence, bruxism, restless legs, hypnogognic hallucinations, no cataplexy Other comprehensive 14 point system review is negative.   PE BP 130/84 mmHg  Pulse 84  Ht 6' 1.5" (1.867 m)  Wt 201 lb (91.173 kg)  BMI 26.16 kg/m2  General: Alert, oriented, no distress.  Skin: normal turgor, no rashes HEENT: Normocephalic, atraumatic. Pupils round and reactive; sclera anicteric;no lid lag.  Nose without nasal septal hypertrophy Mouth/Parynx benign; Mallinpatti scale 3 Neck: No JVD, no carotid bruits with normal carotid upstroke Chest wall: No chest discomfort to palpation Lungs: clear to ausculatation and percussion; no wheezing or rales Heart: RRR, s1 s2 normal faint 1/6 systolic murmur; no S3 or S4 gallop.  No rubs thrills or heaves. Abdomen: soft, nontender; no hepatosplenomehaly, BS+; abdominal aorta nontender and not dilated by  palpation. Back: No CVA tenderness Pulses 2+ Extremities: no clubbing cyanosis or edema, Homan's sign negative  Neurologic: grossly nonfocal Psychologic: Anxious affect with slightly pressured speech.  ECG (independently read by me): Normal sinus rhythm at 84 bpm.  Probable left atrial enlargement.  Probable old inferior MI.  December 2014 ECG: Sinus rhythm at 72 beats per minute. No ectopy.  LABS:  BMET    Component Value Date/Time   NA 137 07/15/2013 1340   K 4.3 07/15/2013 1340   CL 102 07/15/2013 1340   CO2 23 07/15/2013 1340   GLUCOSE 96 07/15/2013 1340   BUN 17 07/15/2013 1340   CREATININE 1.00 07/15/2013 1340   CREATININE 0.93 06/24/2013 0850   CALCIUM 9.6 07/15/2013 1340   GFRNONAA 83* 07/15/2013 1340   GFRAA >90 07/15/2013 1340     Hepatic Function Panel     Component Value Date/Time  PROT 6.7 06/24/2013 0850   ALBUMIN 4.5 06/24/2013 0850   AST 23 06/24/2013 0850   ALT 31 06/24/2013 0850   ALKPHOS 71 06/24/2013 0850   BILITOT 0.5 06/24/2013 0850     CBC    Component Value Date/Time   WBC 9.3 07/15/2013 1340   RBC 4.87 07/15/2013 1340   HGB 15.4 07/15/2013 1340   HCT 46.1 07/15/2013 1340   PLT 262 07/15/2013 1340   MCV 94.7 07/15/2013 1340   MCH 31.6 07/15/2013 1340   MCHC 33.4 07/15/2013 1340   RDW 13.9 07/15/2013 1340   LYMPHSABS 1.9 05/16/2013 1206   MONOABS 1.4* 05/16/2013 1206   EOSABS 0.1 05/16/2013 1206   BASOSABS 0.0 05/16/2013 1206     BNP No results found for: PROBNP  Lipid Panel     Component Value Date/Time   CHOL 200 06/24/2013 0850   TRIG 266* 06/24/2013 0850   HDL 36* 06/24/2013 0850   CHOLHDL 5.6 06/24/2013 0850   VLDL 53* 06/24/2013 0850   LDLCALC 111* 06/24/2013 0850     RADIOLOGY: No results found.    ASSESSMENT AND PLAN: [name] a 58 year old gentleman who presented to the hospital in January 2014 and ruled in for non-ST segment elevation MI. Catheterization revealed LAD and left circumflex  stenoses which were successfully bypassed but he also had a 99% occlusion and a small RV marginal branch of the RCA.  Unfortunately, he resumed smoking since his bypass surgery.  I again had a lengthy discussion with him regarding the importance of complete smoking cessation.  After the patient left I obtain the blood work from Juan Gates office.  Is total LDL cholesterol was increased at 152 with a total cholesterol 236.  Triglycerides 214.  LDL particle number was significant only elevated at 1855.  Liver function studies were normal.  Target LDL in this patient with prior myocardial infarction is less than 70.  He currently is on simvastatin 40 mg and I would suggest that this be changed to atorvastatin 80 mg in light of his significant elevation on current therapy.  He is scheduled to undergo surgery in February.  In January I am scheduling him for a [employer] study for risk stratification and to make certain he is not having any significant ischemia prior to his elective surgery.  See him in the office in follow-up and further recommendations were made at that time.  Time spent: 25 minutes  Juan Bihari, MD, [facility]  11/11/2014 9:45 AM

## 2014-11-11 NOTE — Patient Instructions (Addendum)
Your physician has requested that you have a lexiscan myoview. For further information please visit [email or web address]. Please follow instruction sheet, as given. This will be done in January 2016.   Your physician recommends that you schedule a follow-up appointment in: January 2016.  Your physician has recommended you make the following change in your medication: decrease your aspirin to 81 mg.

## 2014-11-13 ENCOUNTER — Encounter: Payer: Self-pay | Admitting: Cardiovascular Disease

## 2014-11-13 ENCOUNTER — Telehealth: Payer: Self-pay | Admitting: *Deleted

## 2014-11-13 DIAGNOSIS — E785 Hyperlipidemia, unspecified: Secondary | ICD-10-CM | POA: Insufficient documentation

## 2014-11-13 DIAGNOSIS — E782 Mixed hyperlipidemia: Secondary | ICD-10-CM

## 2014-11-13 DIAGNOSIS — Z79899 Other long term (current) drug therapy: Secondary | ICD-10-CM

## 2014-11-13 DIAGNOSIS — I2510 Atherosclerotic heart disease of native coronary artery without angina pectoris: Secondary | ICD-10-CM | POA: Insufficient documentation

## 2014-11-13 MED ORDER — ATORVASTATIN CALCIUM 80 MG PO TABS: 80.0000 mg | ORAL_TABLET | Freq: Every day | ORAL | Status: DC

## 2014-11-13 NOTE — Telephone Encounter (Signed)
Called and notified patient per Dr. Tresa EndoKelly due to elevated LDL levels on recent blood ordered by Dr. Concepcion ElkAvbuere. He would like to change his simvastatin to atorvastatin. Recheck labs in 6-8 weeks. Patient voiced understanding. [facility] [address]

## 2014-11-24 ENCOUNTER — Telehealth: Payer: Self-pay | Admitting: Cardiovascular Disease

## 2014-11-24 NOTE — Telephone Encounter (Signed)
Spoke with patient - he states he cannot get his atorvastatin and that the pharmacy never has it ready for him. Called to pharmacy - they have the Rx on file but filed the medication back as the patient did not pick up the Rx. They will prepare medication for patient to pick up

## 2014-11-24 NOTE — Telephone Encounter (Signed)
Pt said Dr Tresa EndoKelly changed his cholesterol when he was here on the 17th. His medicine still have not been called in. Would you please call it in today to [phone].Please let him know when you call this in.

## 2014-12-02 NOTE — Addendum Note (Signed)
Addended byMarella Bile: Prinston Kynard W. on: 12/02/2014 02:45 PM   Modules accepted: Orders

## 2014-12-04 ENCOUNTER — Encounter (HOSPITAL_COMMUNITY): Payer: Self-pay | Admitting: Cardiology

## 2014-12-09 ENCOUNTER — Other Ambulatory Visit: Payer: Self-pay | Admitting: Cardiovascular Disease

## 2015-01-02 ENCOUNTER — Encounter (HOSPITAL_COMMUNITY): Payer: Self-pay | Admitting: *Deleted

## 2015-01-15 ENCOUNTER — Telehealth (HOSPITAL_COMMUNITY): Payer: Self-pay | Admitting: *Deleted

## 2015-01-15 NOTE — Telephone Encounter (Signed)
Pt would like a call regarding his medication. He is out of town and is unable to fill his meds. He would like to know how many days he would be able to go without it.

## 2015-01-15 NOTE — Telephone Encounter (Signed)
Spoke to Juan Gates Juan Gates states he is in [place] - his medicaid is not covered. RN informed him all  hismedicine are important - lisinopril and metoprolol should be taken with out interruption if possible.  Juan Gates verbalized understanding.

## 2015-01-15 NOTE — Telephone Encounter (Signed)
LEFT MESSAGE ON VOICEMAIL TO CALL BACK ASK TO SPEAK TO ANY TRIAGE NURSE

## 2015-01-16 ENCOUNTER — Encounter (HOSPITAL_COMMUNITY): Payer: Medicaid Other

## 2015-01-21 ENCOUNTER — Ambulatory Visit: Payer: Medicaid Other | Admitting: Cardiovascular Disease

## 2015-01-26 ENCOUNTER — Other Ambulatory Visit: Payer: Self-pay | Admitting: Cardiovascular Disease

## 2015-01-26 NOTE — Telephone Encounter (Signed)
Rx refill sent to patient pharmacy   

## 2015-03-11 ENCOUNTER — Other Ambulatory Visit: Payer: Self-pay | Admitting: Cardiovascular Disease

## 2015-03-12 ENCOUNTER — Other Ambulatory Visit: Payer: Self-pay | Admitting: Cardiovascular Disease

## 2015-03-12 NOTE — Telephone Encounter (Signed)
Rx(s) sent to pharmacy electronically.  

## 2015-07-06 ENCOUNTER — Other Ambulatory Visit: Payer: Self-pay | Admitting: Cardiovascular Disease

## 2015-07-06 NOTE — Telephone Encounter (Signed)
Rx(s) sent to pharmacy electronically.  

## 2015-10-05 ENCOUNTER — Other Ambulatory Visit: Payer: Self-pay | Admitting: Cardiovascular Disease

## 2015-10-06 NOTE — Telephone Encounter (Signed)
REFILL 

## 2015-10-30 ENCOUNTER — Ambulatory Visit (INDEPENDENT_AMBULATORY_CARE_PROVIDER_SITE_OTHER): Payer: Medicaid Other | Admitting: Cardiovascular Disease

## 2015-10-30 VITALS — BP 120/82 | HR 69 | Ht 73.5 in | Wt 202.4 lb

## 2015-10-30 DIAGNOSIS — E7800 Pure hypercholesterolemia, unspecified: Secondary | ICD-10-CM | POA: Diagnosis not present

## 2015-10-30 DIAGNOSIS — F17200 Nicotine dependence, unspecified, uncomplicated: Secondary | ICD-10-CM

## 2015-10-30 DIAGNOSIS — E785 Hyperlipidemia, unspecified: Secondary | ICD-10-CM

## 2015-10-30 DIAGNOSIS — I25810 Atherosclerosis of coronary artery bypass graft(s) without angina pectoris: Secondary | ICD-10-CM

## 2015-10-30 DIAGNOSIS — Z720 Tobacco use: Secondary | ICD-10-CM

## 2015-10-30 DIAGNOSIS — R9431 Abnormal electrocardiogram [ECG] [EKG]: Secondary | ICD-10-CM

## 2015-10-30 DIAGNOSIS — Z951 Presence of aortocoronary bypass graft: Secondary | ICD-10-CM | POA: Diagnosis not present

## 2015-10-30 NOTE — Patient Instructions (Signed)
Your physician wants you to follow-up in: 1 year or sooner if needed. You will receive a reminder letter in the mail two months in advance. If you don't receive a letter, please call our office to schedule the follow-up appointment.   If you need a refill on your cardiac medications before your next appointment, please call your pharmacy.   

## 2015-11-01 ENCOUNTER — Encounter: Payer: Self-pay | Admitting: Cardiovascular Disease

## 2015-11-01 NOTE — Progress Notes (Signed)
Patient ID: Juan Gates, male   DOB: 10/31/1956, 59 y.o.   MRN: [id number]     HPI: Juan Gates is a 59 y.o. male who presents for a one-year follow-up cardiology evaluation.  [name] has a significant history of tobacco use.  He suffered a NSTEMI and underwent cardiac catheterization by Dr. Ellyn Gates on 12/31/2012.  On 10/03/2013 he underwent CABG surgery x2 by Dr. Ricard Gates with a right internal mammary artery to the obtuse marginal, and left internal mammary artery to the LAD. He had preserved LV function. He also had hyperlipidemia. Unfortunately the patient has continued to smoke cigarettes.  I last saw him in December 2014.  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  Since his MI, he has not had any recurrent episodes of chest pain.  Unfortunately he is still smoking 1 pack per day.  He is followed by Dr. Jeanie Gates who  recent rechecked blood work.  He is on Trilipix 45 mg, lovazo1 g twice a day in addition to high-dose atorvastatin 80 mg for hyperlipidemia.  He is to be on metoprolol 50 g twice a day and lisinopril 5 mg for hypertension and CAD. hhe presents for one-year evaluation.  Past Medical History  Diagnosis Date  . 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 Hyperlipidemia   . Hypertension   . Coronary artery disease   . Myocardial infarction [facility]) Jan. 6 2014  . Anxiety   . Arthritis     knees, right hip  . Complication of anesthesia     "started fighting when woke up with tube down throat"    Past Surgical History  Procedure Laterality Date  . Coronary artery bypass graft  01/03/2013    Procedure: CORONARY ARTERY BYPASS GRAFTING (CABG);  Surgeon: Juan Alberts, MD;  Location: [place];  Service: Open Heart Surgery;  Laterality: N/A;  . Intraoperative transesophageal echocardiogram  01/03/2013    Procedure: INTRAOPERATIVE TRANSESOPHAGEAL ECHOCARDIOGRAM;  Surgeon: Juan Alberts, MD;  Location: [place];  Service: Open Heart Surgery;  Laterality:  N/A;  . Cardiac catheterization  12/31/2012    2 vessel cors 90%hazy stenosis prox circ with long segment 60-70%LAD,mod RCA,severe lesion small marginal branch  . Inguinal hernia repair Left 07/22/2013    Procedure: OPEN LEFT INGUINAL HERNIA REPAIR ;  Surgeon: Juan Jolly, MD;  Location: [facility] OR[facility];  Service: General;  Laterality: Left;  . Insertion of mesh Left 07/22/2013    Procedure: INSERTION OF MESH;  Surgeon: Juan Jolly, MD;  Location: [facility];  Service: General;  Laterality: Left;  . Hernia repair  07/22/13    open RIH  . Left heart catheterization with coronary angiogram N/A 01/01/2013    Procedure: LEFT HEART CATHETERIZATION WITH CORONARY ANGIOGRAM;  Surgeon: Juan Man, MD;  Location: [facility] CATH LAB;  Service: Cardiovascular;  Laterality: N/A;    No Known Allergies  Current Outpatient Prescriptions  Medication Sig Dispense Refill  . aspirin 81 MG tablet Take 81 mg by mouth daily.    Juan Gates atorvastatin (LIPITOR) 80 MG tablet Take 1 tablet (80 mg total) by mouth daily. <PLEASE MAKE APPOINTMENT FOR FUTURE REFILLS> 30 tablet 3  . HYDROcodone-acetaminophen (NORCO/VICODIN) 5-325 MG tablet Take 1 tablet by mouth every 6 (six) hours as needed for moderate pain.    Juan Gates lisinopril (PRINIVIL,ZESTRIL) 5 MG tablet Take 1 tablet (5 mg total) by mouth daily. 30 tablet 8  . LORazepam (  ATIVAN) 0.5 MG tablet Take 0.5 mg by mouth daily.    . metoprolol (LOPRESSOR) 50 MG tablet TAKE ONE TABLET BY MOUTH TWICE DAILY 60 tablet 1  . omega-3 acid ethyl esters (LOVAZA) 1 G capsule Take 1 g by mouth 2 (two) times daily.    . QUEtiapine (SEROQUEL XR) 300 MG 24 hr tablet Take 300 mg by mouth at bedtime.    Juan Gates QUEtiapine (SEROQUEL) 25 MG tablet Take 25 mg by mouth at bedtime.    . traZODone (DESYREL) 150 MG tablet Take 150 mg by mouth at bedtime.    . TRILIPIX 45 MG capsule TAKE ONE  BY MOUTH ONCE DAILY 30 capsule 10   No current facility-administered medications for this visit.    Social History    Social History  . Marital Status: Divorced    Spouse Name: N/A  . Number of Children: N/A  . Years of Education: N/A   Occupational History  . Not on file.   Social History Main Topics  . Smoking status: Current Every Day Smoker -- 1.00 packs/day for 20 years    Types: Cigarettes  . Smokeless tobacco: Never Used  . Alcohol Use: Yes     Comment: beers "every now and then"  . Drug Use: No  . Sexual Activity: Not on file   Other Topics Concern  . Not on file   Social History Narrative    Family History  Problem Relation Age of Onset  . Hypertension Mother   . Cancer Father     bone cancer  . Cancer Brother     metastatic melanoma   Additional social history is notable that he is single. He has 3 children. He smoking a pack per day.  ROS General: Negative; No fevers, chills, or night sweats;  HEENT: Negative; No changes in vision or hearing, sinus congestion, difficulty swallowing Pulmonary: Negative; No cough, wheezing, shortness of breath, hemoptysis Cardiovascular: Negative; No chest pain, presyncope, syncope, palpitations GI: Negative; No nausea, vomiting, diarrhea, or abdominal pain GU: Negative; No dysuria, hematuria, or difficulty voiding Musculoskeletal: Positive for left leg discomfort.  She'll be treated by his lipoma of his left thigh; no myalgias, joint pain, or weakness Hematologic/Oncology: Negative; no easy bruising, bleeding Endocrine: Negative; no heat/cold intolerance; no diabetes Neuro: Negative; no changes in balance, headaches Skin: Negative; No rashes or skin lesions Psychiatric: Negative; No behavioral problems, depression Sleep: Negative; No snoring, daytime sleepiness, hypersomnolence, bruxism, restless legs, hypnogognic hallucinations, no cataplexy Other comprehensive 14 point system review is negative.   PE BP 120/82 mmHg  Pulse 69  Ht 6' 1.5" (1.867 m)  Wt 202 lb 6.4 oz (91.808 kg)  BMI 26.34 kg/m2   Wt Readings from Last 3  Encounters:  10/30/15 202 lb 6.4 oz (91.808 kg)  11/11/14 201 lb (91.173 kg)  01/20/14 225 lb (102.059 kg)   General: Alert, oriented, no distress.  Skin: normal turgor, no rashes HEENT: Normocephalic, atraumatic. Pupils round and reactive; sclera anicteric;no lid lag.  Nose without nasal septal hypertrophy Mouth/Parynx benign; Mallinpatti scale 3 Neck: No JVD, no carotid bruits with normal carotid upstroke Chest wall: No chest discomfort to palpation Lungs: clear to ausculatation and percussion; no wheezing or rales Heart: RRR, s1 s2 normal faint 1/6 systolic murmur; no S3 or S4 gallop.  No rubs thrills or heaves. Abdomen: soft, nontender; no hepatosplenomehaly, BS+; abdominal aorta nontender and not dilated by palpation. Back: No CVA tenderness Pulses 2+ Extremities: no clubbing cyanosis or edema, Homan's sign negative  Neurologic: grossly nonfocal Psychologic: Anxious affect with slightly pressured speech.  ECG (independently read by me): normal sinus rhythm at 69 bpm.  PR interval 144 ms, QTc interval 426 ms.  Q waves in 3 and aVF.  November 2015 ECG (independently read by me): Normal sinus rhythm at 84 bpm.  Probable left atrial enlargement.  Probable old inferior MI.  December 2014 ECG: Sinus rhythm at 72 beats per minute. No ectopy.  LABS:  BMP Latest Ref Rng 07/15/2013 06/24/2013 05/16/2013  Glucose 70 - 99 mg/dL 96 93 98  BUN 6 - 23 mg/dL _0 Creatinine 0.50 - 1.35 mg/dL 1.00 0.93 0.90  Sodium 135 - 145 mEq/L 137 140 139  Potassium 3.5 - 5.1 mEq/L 4.3 4.3 5.0  Chloride 96 - 112 mEq/L 102 107 107  CO2 19 - 32 mEq/L 23 25 -  Calcium 8.4 - 10.5 mg/dL 9.6 9.3 -   Hepatic Function Latest Ref Rng 06/24/2013 01/02/2013 01/01/2013  Total Protein 6.0 - 8.3 g/dL 6.7 7.0 6.6  Albumin 3.5 - 5.2 g/dL 4.5 3.8 3.6  AST 0 - 37 U/L 23 23 32  ALT 0 - 53 U/L _1 Alk Phosphatase 39 - 117 U/L 71 66 62  Total Bilirubin 0.3 - 1.2 mg/dL 0.5 0.6 0.4   CBC Latest Ref Rng 07/15/2013  05/16/2013 05/16/2013  WBC 4.0 - 10.5 K/uL 9.3 - 14.4(H)  Hemoglobin 13.0 - 17.0 g/dL 15.4 17.0 16.4  Hematocrit 39.0 - 52.0 % 46.1 50.0 47.6  Platelets 150 - 400 K/uL 262 - 194   Lab Results  Component Value Date   MCV 94.7 07/15/2013   MCV 91.7 05/16/2013   MCV 94.6 01/06/2013   Lab Results  Component Value Date   TSH 6.377* 01/01/2013   Lab Results  Component Value Date   HGBA1C 5.5 01/02/2013   Lipid Panel     Component Value Date/Time   CHOL 200 06/24/2013 0850   TRIG 266* 06/24/2013 0850   HDL 36* 06/24/2013 0850   CHOLHDL 5.6 06/24/2013 0850   VLDL 53* 06/24/2013 0850   LDLCALC 111* 06/24/2013 0850     RADIOLOGY: No results found.    ASSESSMENT AND PLAN: Mr. Ermon Sagan a 59 year-old gentleman who presented to the hospital in January 2014 and ruled in for non-ST segment elevation MI. Catheterization revealed LAD and left circumflex stenoses which were successfully bypassed but he also had a 99% occlusion and a small RV marginal branch of the RCA.  Unfortunately, he resumed smoking since his bypass surgery.  I again had a lengthy discussion with him regarding the importance of complete smoking cessation. When I last saw him, we had scheduled a Lexiscan study prior to potential lipoma resection.  He never had his lipoma surgery and never underwent a preoperative Lexiscan study.  His blood pressure today is stable on current therapy.  I will try to obtain the recent blood work done by his primary physician to make certain his lipids are aggressively treated.  His BMI is 26.34 kg/m.  We discussed the importance of exercising at least 5 days per week.  He continues to be on medication for anxiety and depression.  I will see him in one year for reevaluation.   Time spent: 25 minutes  Troy Sine, MD, [facility]  11/01/2015 2:30 PM

## 2015-11-04 ENCOUNTER — Encounter: Payer: Self-pay | Admitting: Cardiovascular Disease

## 2015-11-11 ENCOUNTER — Other Ambulatory Visit: Payer: Self-pay | Admitting: Cardiovascular Disease

## 2015-11-11 NOTE — Telephone Encounter (Signed)
Rx request sent to pharmacy.  

## 2015-12-08 ENCOUNTER — Other Ambulatory Visit: Payer: Self-pay | Admitting: Cardiovascular Disease

## 2016-01-11 ENCOUNTER — Other Ambulatory Visit: Payer: Self-pay | Admitting: Cardiovascular Disease

## 2016-01-11 NOTE — Telephone Encounter (Signed)
Rx request sent to pharmacy.  

## 2016-09-06 ENCOUNTER — Telehealth: Payer: Self-pay | Admitting: Cardiovascular Disease

## 2016-09-06 MED ORDER — CHOLINE FENOFIBRATE 45 MG PO CPDR: 45.0000 mg | DELAYED_RELEASE_CAPSULE | Freq: Every day | ORAL | 5 refills | Status: DC

## 2016-09-06 NOTE — Addendum Note (Signed)
Addended by: Ladell HeadsOSE, NATHAN R on: 09/06/2016 03:01 PM   Modules accepted: Orders

## 2016-09-06 NOTE — Telephone Encounter (Signed)
New message    Pt C/O medication issue:  1. Name of Medication: trilipox   2. How are you currently taking this medication (dosage and times per day)? 45 mg once a day   3. Are you having a reaction (difficulty breathing--STAT)? No   4. What is your medication issue? Patient having issues getting medication refill - insurance- will go into more details.

## 2016-09-06 NOTE — Telephone Encounter (Signed)
Signed in error. Ok to change to generic. Fenofibrate is tricky unfortunately we will not know which form of fenofibrate is covered by insurance until we process it through. The pharmacy should be able to tell which formulation is preferred when they process the medication.

## 2016-09-06 NOTE — Telephone Encounter (Signed)
Spoke to patient. Notes he has been on the Trilipix form of fenofibrate since 2014, just had recently been switched to different insurance policy under Medicare and is no longer able to get this Rx authorized. He is down to last 3-4 tabs and trying to get approved for the drug.   Pt aware I will check into this and review w PharmD - should be ok to write for generic fenofibrate and send to his pharmacy.  He also gave me his policy ID information in case we need it to run a prior authorization or check for covered formulary.  He notes he is on the Medicare Limited Income program: ID: [id number]  His Rx card information: [facility] RX [id number] Member ID: [id number]

## 2016-09-06 NOTE — Telephone Encounter (Signed)
Sent in E scribe for generic fenofibrate at direction of Prudence DavidsonKelley Auten. Called pharmacy and verified coverage. They notified me this would be OK under pt insurance w cost of $1.20 for 30 day supply. I called patient to notify, he voiced thanks and acknowledgment.

## 2016-09-08 ENCOUNTER — Other Ambulatory Visit: Payer: Self-pay

## 2016-09-08 MED ORDER — CHOLINE FENOFIBRATE 45 MG PO CPDR: 45.0000 mg | DELAYED_RELEASE_CAPSULE | Freq: Every day | ORAL | 3 refills | Status: DC

## 2016-10-19 ENCOUNTER — Other Ambulatory Visit: Payer: Self-pay | Admitting: Cardiovascular Disease

## 2016-11-06 ENCOUNTER — Other Ambulatory Visit: Payer: Self-pay | Admitting: Cardiovascular Disease

## 2016-11-24 ENCOUNTER — Encounter: Payer: Self-pay | Admitting: Cardiovascular Disease

## 2016-11-24 ENCOUNTER — Ambulatory Visit (INDEPENDENT_AMBULATORY_CARE_PROVIDER_SITE_OTHER): Payer: Medicare Other | Admitting: Cardiovascular Disease

## 2016-11-24 VITALS — BP 131/83 | HR 65 | Ht 73.5 in | Wt 218.6 lb

## 2016-11-24 DIAGNOSIS — Z79899 Other long term (current) drug therapy: Secondary | ICD-10-CM

## 2016-11-24 DIAGNOSIS — I2510 Atherosclerotic heart disease of native coronary artery without angina pectoris: Secondary | ICD-10-CM | POA: Diagnosis not present

## 2016-11-24 DIAGNOSIS — E7800 Pure hypercholesterolemia, unspecified: Secondary | ICD-10-CM

## 2016-11-24 DIAGNOSIS — F17200 Nicotine dependence, unspecified, uncomplicated: Secondary | ICD-10-CM | POA: Diagnosis not present

## 2016-11-24 DIAGNOSIS — E785 Hyperlipidemia, unspecified: Secondary | ICD-10-CM

## 2016-11-24 DIAGNOSIS — Z951 Presence of aortocoronary bypass graft: Secondary | ICD-10-CM

## 2016-11-24 MED ORDER — METOPROLOL TARTRATE 50 MG PO TABS: 50.0000 mg | ORAL_TABLET | Freq: Two times a day (BID) | ORAL | 3 refills | Status: DC

## 2016-11-24 MED ORDER — OMEGA-3-ACID ETHYL ESTERS 1 G PO CAPS: 1.0000 g | ORAL_CAPSULE | Freq: Two times a day (BID) | ORAL | 3 refills | Status: DC

## 2016-11-24 MED ORDER — CHOLINE FENOFIBRATE 45 MG PO CPDR: 45.0000 mg | DELAYED_RELEASE_CAPSULE | Freq: Every day | ORAL | 3 refills | Status: DC

## 2016-11-24 MED ORDER — ATORVASTATIN CALCIUM 80 MG PO TABS: 80.0000 mg | ORAL_TABLET | Freq: Every day | ORAL | 3 refills | Status: DC

## 2016-11-24 MED ORDER — LISINOPRIL 5 MG PO TABS: 5.0000 mg | ORAL_TABLET | Freq: Every day | ORAL | 3 refills | Status: DC

## 2016-11-24 NOTE — Progress Notes (Signed)
Patient ID: Juan Gates, male   DOB: 1956/07/04, 60 y.o.   MRN: [id number]     HPI: Juan Gates is a 60 y.o. male who presents for a one-year follow-up cardiology evaluation.  Juan Gates has a significant history of tobacco use.  He suffered a NSTEMI and underwent cardiac catheterization by Dr. Ellyn Hack on 12/31/2012.  On 01/03/2013 he underwent CABG surgery x2 by Dr. Ricard Dillon with a right internal mammary artery to the obtuse marginal, and left internal mammary artery to the LAD. He had preserved LV function. He also had hyperlipidemia. Unfortunately the patient has continued to smoke cigarettes.  I  Since I last saw him one year ago, he was married for the third time in September 2017.  He continues to smoke one pack of cigarettes per day.  He has been on disability since his MI in January 2014.  Previously he was in the construction business.  He tries to be active.  He denies any recurrent episodes of chest pain.  He denies difficulty with sleep.  He is unaware of palpitations.  He has a history of mixed hyperlipidemia and has been  on choline fenofibrate 45 mg, lovazo1 g twice a day in addition to high-dose atorvastatin 80 mg for hyperlipidemia.  He is to be on metoprolol 50 mg twice a day and lisinopril 5 mg for hypertension and CAD.  For anxiety/depression he has been on trazodone and lorazepam and also takes Seroquel.  He presents for evaluation  Past Medical History:  Diagnosis Date  . Anxiety   . Arthritis    knees, right hip  . Complication of anesthesia    "started fighting when woke up with tube down throat"  . Coronary artery disease   . Hyperlipidemia   . Hypertension   . Myocardial infarction Jan. 6 2014  . 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 CARDIAC CATHETERIZATION  12/31/2012   2 vessel cors 90%hazy stenosis prox circ with long segment 60-70%LAD,mod RCA,severe lesion small marginal branch  .  CORONARY ARTERY BYPASS GRAFT  01/03/2013   Procedure: CORONARY ARTERY BYPASS GRAFTING (CABG);  Surgeon: Rexene Alberts, MD;  Location: [facility];  Service: Open Heart Surgery;  Laterality: N/A;  . HERNIA REPAIR  07/22/13   open RIH  . INGUINAL HERNIA REPAIR Left 07/22/2013   Procedure: OPEN LEFT INGUINAL HERNIA REPAIR ;  Surgeon: Edward Jolly, MD;  Location: [facility];  Service: General;  Laterality: Left;  . INSERTION OF MESH Left 07/22/2013   Procedure: INSERTION OF MESH;  Surgeon: Edward Jolly, MD;  Location: [facility];  Service: General;  Laterality: Left;  . INTRAOPERATIVE TRANSESOPHAGEAL ECHOCARDIOGRAM  01/03/2013   Procedure: INTRAOPERATIVE TRANSESOPHAGEAL ECHOCARDIOGRAM;  Surgeon: Rexene Alberts, MD;  Location: [facility];  Service: Open Heart Surgery;  Laterality: N/A;  . LEFT HEART CATHETERIZATION WITH CORONARY ANGIOGRAM N/A 01/01/2013   Procedure: LEFT HEART CATHETERIZATION WITH CORONARY ANGIOGRAM;  Surgeon: Leonie Man, MD;  Location: [facility];  Service: Cardiovascular;  Laterality: N/A;    No Known Allergies  Current Outpatient Prescriptions  Medication Sig Dispense Refill  . aspirin 81 MG tablet Take 81 mg by mouth daily.    Marland Kitchen atorvastatin (LIPITOR) 80 MG tablet Take 1 tablet (80 mg total) by mouth daily. 90 tablet 3  . Choline Fenofibrate 45 MG capsule Take 1 capsule (45 mg total) by mouth daily. 90 capsule  3  . HYDROcodone-acetaminophen (NORCO/VICODIN) 5-325 MG tablet Take 1 tablet by mouth every 6 (six) hours as needed for moderate pain.    Marland Kitchen lisinopril (PRINIVIL,ZESTRIL) 5 MG tablet Take 1 tablet (5 mg total) by mouth daily. 90 tablet 3  . LORazepam (ATIVAN) 0.5 MG tablet Take 0.5 mg by mouth daily.    . metoprolol (LOPRESSOR) 50 MG tablet Take 1 tablet (50 mg total) by mouth 2 (two) times daily. 180 tablet 3  . omega-3 acid ethyl esters (LOVAZA) 1 g capsule Take 1 capsule (1 g total) by mouth 2 (two) times daily. 180 capsule 3  . QUEtiapine (SEROQUEL XR) 400 MG 24 hr  tablet Take 1 tablet by mouth at bedtime.    Marland Kitchen QUEtiapine (SEROQUEL) 25 MG tablet Take 25 mg by mouth at bedtime.    . traZODone (DESYREL) 100 MG tablet Take 1 tablet by mouth at bedtime.     No current facility-administered medications for this visit.     Social History   Social History  . Marital status: Married    Spouse name: N/A  . Number of children: N/A  . Years of education: N/A   Occupational History  . Not on file.   Social History Main Topics  . Smoking status: Current Every Day Smoker    Packs/day: 1.00    Years: 20.00    Types: Cigarettes  . Smokeless tobacco: Never Used  . Alcohol use Yes     Comment: beers "every now and then"  . Drug use: No  . Sexual activity: Not on file   Other Topics Concern  . Not on file   Social History Narrative  . No narrative on file    Family History  Problem Relation Age of Onset  . Hypertension Mother   . Cancer Father     bone cancer  . Cancer Brother     metastatic melanoma   Additional social history is notable that he is single. He has 3 children. He smoking a pack per day.  ROS General: Negative; No fevers, chills, or night sweats;  HEENT: Negative; No changes in vision or hearing, sinus congestion, difficulty swallowing Pulmonary: Negative; No cough, wheezing, shortness of breath, hemoptysis Cardiovascular: Negative; No chest pain, presyncope, syncope, palpitations GI: Negative; No nausea, vomiting, diarrhea, or abdominal pain GU: Negative; No dysuria, hematuria, or difficulty voiding Musculoskeletal: Positive for left leg discomfort.  She'll be treated by his lipoma of his left thigh; no myalgias, joint pain, or weakness Hematologic/Oncology: Negative; no easy bruising, bleeding Endocrine: Negative; no heat/cold intolerance; no diabetes Neuro: Negative; no changes in balance, headaches Skin: Negative; No rashes or skin lesions Psychiatric: Negative; No behavioral problems, depression Sleep: Negative; No  snoring, daytime sleepiness, hypersomnolence, bruxism, restless legs, hypnogognic hallucinations, no cataplexy Other comprehensive 14 point system review is negative.   PE BP 131/83   Pulse 65   Ht 6' 1.5" (1.867 m)   Wt 218 lb 9.6 oz (99.2 kg)   BMI 28.45 kg/m    Repeat blood pressure by me was 132/80.  He tells me that his blood pressures at home typically run in the 120-130 range systolically and 78-82 diastolically.  Wt Readings from Last 3 Encounters:  11/24/16 218 lb 9.6 oz (99.2 kg)  10/30/15 202 lb 6.4 oz (91.8 kg)  11/11/14 201 lb (91.2 kg)   General: Alert, oriented, no distress.  Skin: normal turgor, no rashes HEENT: Normocephalic, atraumatic. Pupils round and reactive; sclera anicteric;no lid lag.  Nose without  nasal septal hypertrophy Mouth/Parynx benign; Mallinpatti scale 3 Neck: No JVD, no carotid bruits with normal carotid upstroke Chest wall: No chest discomfort to palpation Lungs: clear to ausculatation and percussion; no wheezing or rales Heart: RRR, s1 s2 normal faint 1/6 systolic murmur; no S3 or S4 gallop.  No rubs thrills or heaves. Abdomen: soft, nontender; no hepatosplenomehaly, BS+; abdominal aorta nontender and not dilated by palpation. Back: No CVA tenderness Pulses 2+ Extremities: no clubbing cyanosis or edema, Homan's sign negative  Neurologic: grossly nonfocal Psychologic: Normal affect and mood  ECG (independently read by me): Normal sinus rhythm at 65 bpm.  Normal intervals.  No ectopy.    November 2016ECG (independently read by me): normal sinus rhythm at 69 bpm.  PR interval 144 ms, QTc interval 426 ms.  Q waves in 3 and aVF.  November 2015 ECG (independently read by me): Normal sinus rhythm at 84 bpm.  Probable left atrial enlargement.  Probable old inferior MI.  December 2014 ECG: Sinus rhythm at 72 beats per minute. No ectopy.  LABS:  BMP Latest Ref Rng & Units 07/15/2013 06/24/2013 05/16/2013  Glucose 70 - 99 mg/dL 96 93 98  BUN 6 -  23 mg/dL '17 10 18  '$ Creatinine 0.50 - 1.35 mg/dL 1.00 0.93 0.90  Sodium 135 - 145 mEq/L 137 140 139  Potassium 3.5 - 5.1 mEq/L 4.3 4.3 5.0  Chloride 96 - 112 mEq/L 102 107 107  CO2 19 - 32 mEq/L 23 25 -  Calcium 8.4 - 10.5 mg/dL 9.6 9.3 -   Hepatic Function Latest Ref Rng & Units 06/24/2013 01/02/2013 01/01/2013  Total Protein 6.0 - 8.3 g/dL 6.7 7.0 6.6  Albumin 3.5 - 5.2 g/dL 4.5 3.8 3.6  AST 0 - 37 U/L 23 23 32  ALT 0 - 53 U/L '31 27 25  '$ Alk Phosphatase 39 - 117 U/L 71 66 62  Total Bilirubin 0.3 - 1.2 mg/dL 0.5 0.6 0.4   CBC Latest Ref Rng & Units 07/15/2013 05/16/2013 05/16/2013  WBC 4.0 - 10.5 K/uL 9.3 - 14.4(H)  Hemoglobin 13.0 - 17.0 g/dL 15.4 17.0 16.4  Hematocrit 39.0 - 52.0 % 46.1 50.0 47.6  Platelets 150 - 400 K/uL 262 - 194   Lab Results  Component Value Date   MCV 94.7 07/15/2013   MCV 91.7 05/16/2013   MCV 94.6 01/06/2013   Lab Results  Component Value Date   TSH 6.377 (H) 01/01/2013   Lab Results  Component Value Date   HGBA1C 5.5 01/02/2013   Lipid Panel     Component Value Date/Time   CHOL 200 06/24/2013 0850   TRIG 266 (H) 06/24/2013 0850   HDL 36 (L) 06/24/2013 0850   CHOLHDL 5.6 06/24/2013 0850   VLDL 53 (H) 06/24/2013 0850   LDLCALC 111 (H) 06/24/2013 0850     RADIOLOGY: No results found.    ASSESSMENT AND PLAN: Juan Gates a 61 year-old gentleman who presented to the hospital in January 2014 and ruled in for non-ST segment elevation MI. Catheterization revealed LAD and left circumflex stenoses but he also had a 99% occlusion in a small RV marginal branch of the RCA. On 01/13/2013 he underwent CABG surgery 2 with a RIMA to the OM and LIMA to the LAD.  Unfortunately, he resumed smoking since his bypass surgery.  I again had a lengthy discussion with him regarding the importance of complete smoking cessation.  He tells me earlier this year he did have blood work done by his primary  physician.  In June 2014.  He had mixed hyperlipidemia.  Since  that time, he has been on therapy with high dose Lipitor, low-dose fenofibrate, as well as omega-3 fatty acids.  I have recommended a complete set of blood work be obtained in the fasting state.  His blood pressure today remains stable on his medical regimen consisting of metoprolol 51 g twice a day and lisinopril 5 mg daily.  I discussed with him the importance of exercise and have recommended at least 5 days per week for minimum of 30 minutes.  I will contact him regarding his blood work.  Adjustments to his medications will be made if necessary.  I will see him in one year for reevaluation.                                                                                                                                                                          Time spent: 25 minutes  Troy Sine, MD, [facility]  11/24/2016 11:52 AM

## 2016-11-24 NOTE — Patient Instructions (Signed)
Your physician recommends that you return for lab work in: fasting. Lab slips has been provided.   Your physician wants you to follow-up in: 1 year or sooner if needed. You will receive a reminder letter in the mail two months in advance. If you don't receive a letter, please call our office to schedule the follow-up appointment.

## 2016-12-16 LAB — LIPID PANEL
CHOL/HDL RATIO: 4.8 ratio (ref <5.0)
Cholesterol: 159 mg/dL (ref <200)
HDL: 33 mg/dL — AB (ref >40)
LDL Cholesterol: 91 mg/dL (ref <100)
TRIGLYCERIDES: 176 mg/dL — AB (ref <150)
VLDL: 35 mg/dL — ABNORMAL HIGH (ref <30)

## 2016-12-16 LAB — TSH: TSH: 3.27 mIU/L (ref 0.40–4.50)

## 2016-12-16 LAB — CBC
HEMATOCRIT: 46 % (ref 38.5–50.0)
HEMOGLOBIN: 15.4 g/dL (ref 13.2–17.1)
MCH: 32.5 pg (ref 27.0–33.0)
MCHC: 33.5 g/dL (ref 32.0–36.0)
MCV: 97 fL (ref 80.0–100.0)
MPV: 10.4 fL (ref 7.5–12.5)
Platelets: 235 10*3/uL (ref 140–400)
RBC: 4.74 MIL/uL (ref 4.20–5.80)
RDW: 12.9 % (ref 11.0–15.0)
WBC: 7.4 10*3/uL (ref 3.8–10.8)

## 2016-12-16 LAB — COMPREHENSIVE METABOLIC PANEL
ALBUMIN: 4 g/dL (ref 3.6–5.1)
ALK PHOS: 46 U/L (ref 40–115)
ALT: 19 U/L (ref 9–46)
AST: 19 U/L (ref 10–35)
BILIRUBIN TOTAL: 0.6 mg/dL (ref 0.2–1.2)
BUN: 15 mg/dL (ref 7–25)
CALCIUM: 8.9 mg/dL (ref 8.6–10.3)
CO2: 21 mmol/L (ref 20–31)
Chloride: 106 mmol/L (ref 98–110)
Creat: 0.92 mg/dL (ref 0.70–1.33)
Glucose, Bld: 98 mg/dL (ref 65–99)
POTASSIUM: 4 mmol/L (ref 3.5–5.3)
Sodium: 137 mmol/L (ref 135–146)
Total Protein: 6.6 g/dL (ref 6.1–8.1)

## 2016-12-30 ENCOUNTER — Telehealth: Payer: Self-pay | Admitting: Cardiovascular Disease

## 2016-12-30 ENCOUNTER — Encounter: Payer: Self-pay | Admitting: *Deleted

## 2016-12-30 DIAGNOSIS — I10 Essential (primary) hypertension: Secondary | ICD-10-CM

## 2016-12-30 MED ORDER — BLOOD PRESSURE CUFF MISC: 1.0000 | Freq: Once | 0 refills | Status: AC

## 2016-12-30 NOTE — Telephone Encounter (Signed)
[facility] call [facility] and was told he could get it through insurance if he has a Rx.  Advised I could send Rx to preferred pharmacy[facility] [facility].    Rx sent electronically.  Pt made aware and verbalized understanding.

## 2016-12-30 NOTE — Telephone Encounter (Signed)
Returned call to patient: patient states he went [facility] yesterday and checked his BP and it was 158/92 and was concerned. [place].  States he does not have a BP cuff at home but borrowed his sister-in-laws last week and it was running 125/80-130/90, encouraged patient that these were good readings.  Denies blurry vision, HA, CP, SOB, dizziness.  Reports compliance with current medications: metoprolol 50mg  BID and lisinopril 5mg  day.    Ad[facility] little high due to walking around prior to taking BP and that it is normal for BP to flucuate, it is our bodies natural response to activity.  Advised medication changes will more than likely not be made based on one high reading but I will forward to MD for review.    Advised to get BP cuff if possible and keep BP log and to notify us if BP consistently runs high or becomes symptomatic.  Pt agreed and verbalized understanding.  Routed to MD for review.

## 2016-12-30 NOTE — Telephone Encounter (Signed)
New message  Pt c/o medication issue:  1. Name of Medication: Metoprolol... Lisinopril   2. How are you currently taking this medication (dosage and times per day)? 50mg .... 5mg   3. Are you having a reaction (difficulty breathing--STAT)? Per pt is having high bp issues   4. What is your medication issue? Per pt would like to speak with RN. Please call back to discuss

## 2016-12-31 NOTE — Telephone Encounter (Signed)
Acknowledged.

## 2017-11-25 ENCOUNTER — Other Ambulatory Visit: Payer: Self-pay | Admitting: Cardiovascular Disease

## 2017-11-30 ENCOUNTER — Other Ambulatory Visit: Payer: Self-pay | Admitting: Cardiovascular Disease

## 2017-12-02 ENCOUNTER — Other Ambulatory Visit: Payer: Self-pay | Admitting: Cardiovascular Disease

## 2017-12-06 ENCOUNTER — Other Ambulatory Visit: Payer: Self-pay | Admitting: Cardiovascular Disease

## 2017-12-06 NOTE — Telephone Encounter (Signed)
REFILL 

## 2017-12-30 ENCOUNTER — Other Ambulatory Visit: Payer: Self-pay | Admitting: Cardiovascular Disease

## 2018-02-03 ENCOUNTER — Other Ambulatory Visit: Payer: Self-pay | Admitting: Cardiovascular Disease

## 2018-02-05 ENCOUNTER — Telehealth: Payer: Self-pay | Admitting: Cardiovascular Disease

## 2018-02-05 NOTE — Telephone Encounter (Signed)
REFILL 

## 2018-02-05 NOTE — Telephone Encounter (Signed)
Follow up   [facility]   Also [facility] a [facility]  [facility]  atorvastatin (LIPITOR) 80 MG tablet TAKE ONE TABLET BY MOUTH ONCE DAILY    lisinopril (PRINIVIL,ZESTRIL) 5 MG tablet Take 1 tablet (5 mg total) by mouth daily. NEED OV.   metoprolol tartrate (LOPRESSOR) 50 MG tablet Take 1 tablet (50 mg total) by mouth 2 (two) times daily. NEED OV.

## 2018-02-05 NOTE — Telephone Encounter (Signed)
Follow up     *STAT* If patient is at the pharmacy, call can be transferred to refill team.   1. Which medications need to be refilled? (please list name of each medication and dose if known) atorvastatin (LIPITOR) 80 MG tablet and lisinopril (PRINIVIL,ZESTRIL) 5 MG tablet and metoprolol tartrate (LOPRESSOR) 50 MG tablet  2. Which pharmacy/location (including street and city if local pharmacy) is medication to be sent to? [facility] [address] - [address]  3. Do they need a 30 day or 90 day supply? 30

## 2018-02-05 NOTE — Telephone Encounter (Signed)
Spoke with the patient's wife per the patient's request. She stated that he needs a replacement medication for the choline fenofibrate. [employer] stated that this is a non formulary medication and it will cost the patient $90. He is currently out of this medication.

## 2018-02-05 NOTE — Telephone Encounter (Signed)
It may be possible to discontinue fenofibrate.  Recommend fasting lipid panel and chemistry assessment is not done recently

## 2018-02-05 NOTE — Telephone Encounter (Signed)
New message   Pt c/o medication issue:  1. Name of Medication:  Choline Fenofibrate (FENOFIBRIC ACID) 45 MG CPDR TAKE ONE CAPSULE BY MOUTH ONCE DAILY     2. How are you currently taking this medication (dosage and times per day)? 1 capsule   3. Are you having a reaction (difficulty breathing--STAT)? no  4. What is your medication issue?  He is completely out of this medication, but he can not afford the $90 copay for this prescription , they said it is a tier 4 and he would need a different medication that is cheaper    [facility] [place] - 30 day if possible

## 2018-02-06 NOTE — Telephone Encounter (Signed)
Returned the call to the patient with Dr. Landry DykeKelly's recommendation. He stated that he is getting these labs drawn at his PCP tomorrow. He will have the results faxed over.

## 2018-02-07 ENCOUNTER — Telehealth: Payer: Self-pay | Admitting: Cardiovascular Disease

## 2018-02-07 NOTE — Telephone Encounter (Signed)
New Message  Patient is just wanting to clarify that he can discontinue the Choline Fenofibrate (FENOFIBRIC ACID) 45 MG CPDR. He also advised that the PCP will be sending in his lab results no later than Friday.  Please call to discuss.

## 2018-02-07 NOTE — Telephone Encounter (Signed)
Will need to see the results of the lab when available.  However, if the patient had cream and sugar this will affect his triglycerides and they may be erroneously elevated on his recent blood test

## 2018-02-07 NOTE — Telephone Encounter (Signed)
Per perv TN:February 05, 2018  Lennette BihariKelly, Thomas A, MD > Sandi Mariscalichardson, Lisa Y, RN 6:07 PM  Note   It may be possible to discontinue fenofibrate.  Recommend fasting lipid panel and chemistry assessment is not done recently.   Returned call to pt He states that he had labs drawn today and states that he will go pick up the results on Friday or when results are available. He said that he was fasting except he had a cup of coffee with creamer and sugar.

## 2018-02-08 NOTE — Telephone Encounter (Signed)
Noted; will await lab results.

## 2018-02-09 ENCOUNTER — Telehealth: Payer: Self-pay | Admitting: Cardiovascular Disease

## 2018-02-09 NOTE — Telephone Encounter (Signed)
[employer] has been made aware that we are waiting for results from recent lab work to determine the status of the fenofibrate.

## 2018-02-09 NOTE — Telephone Encounter (Signed)
Please call,concerning pt's Fenofibrate.

## 2018-02-20 ENCOUNTER — Telehealth: Payer: Self-pay | Admitting: Cardiovascular Disease

## 2018-02-20 NOTE — Telephone Encounter (Signed)
Labs scanned to epic 02/07/18.   Routed to MD/RN to review

## 2018-02-20 NOTE — Telephone Encounter (Signed)
° °  Pt c/o medication issue:  1. Name of Medication: FENOFIBRIC ACID 45mg   2. How are you currently taking this medication (dosage and times per day)? Not [facility] questions  3. Are you having a reaction (difficulty breathing--STAT)? no  4. What is your medication issue? Per wife's call [employer] has sent yet another letter pertaining to medication FENOFIBRIC ACID 45mg .  Patient would like to know whether he needs to take this or will it be changed, pt did lab work a week ago per wife.  They would like a call back please.

## 2018-02-21 MED ORDER — EZETIMIBE 10 MG PO TABS: 10.0000 mg | ORAL_TABLET | Freq: Every day | ORAL | 3 refills | Status: DC

## 2018-02-21 NOTE — Addendum Note (Signed)
Addended by: Johney FrameSHARPE, Cadarius Nevares A on: 02/21/2018 01:25 PM   Modules accepted: Orders

## 2018-02-21 NOTE — Telephone Encounter (Signed)
Patient is aware and verbalized understanding.  rx sent to pharmacy

## 2018-02-21 NOTE — Telephone Encounter (Signed)
Lab reviewed, see results.  LDL remains elevated.  Triglycerides are not significantly elevated.  Recommend DC fenofibrate.  And Zetia 10 mg to atorvastatin 80 mg for more optimal lipid management

## 2018-03-06 ENCOUNTER — Other Ambulatory Visit: Payer: Self-pay

## 2018-03-06 ENCOUNTER — Telehealth: Payer: Self-pay | Admitting: Cardiovascular Disease

## 2018-03-06 MED ORDER — METOPROLOL TARTRATE 50 MG PO TABS: 50.0000 mg | ORAL_TABLET | Freq: Two times a day (BID) | ORAL | 0 refills | Status: DC

## 2018-03-06 MED ORDER — LISINOPRIL 5 MG PO TABS: 5.0000 mg | ORAL_TABLET | Freq: Every day | ORAL | 0 refills | Status: DC

## 2018-03-06 MED ORDER — ATORVASTATIN CALCIUM 80 MG PO TABS: 80.0000 mg | ORAL_TABLET | Freq: Every day | ORAL | 0 refills | Status: DC

## 2018-03-06 NOTE — Telephone Encounter (Signed)
°*  STAT* If patient is at the pharmacy, call can be transferred to refill team.   1. Which medications need to be refilled? (please list name of each medication and dose if known) Atorvastatin 80 mg , Metoprolol tartrate 50 mg  and Lisinopril 5mg   2. Which pharmacy/location (including street and city if local pharmacy) is medication to be sent to? [facility]  3. Do they need a 30 day or 90 day supply? 90

## 2018-03-06 NOTE — Telephone Encounter (Signed)
Ms. Owens Sharkack (patient wife) calling,  States that she was [facility] to [facility] Dr. Landry DykeKelly's n

## 2018-03-12 ENCOUNTER — Other Ambulatory Visit: Payer: Self-pay | Admitting: Cardiovascular Disease

## 2018-03-12 MED ORDER — LISINOPRIL 5 MG PO TABS: 5.0000 mg | ORAL_TABLET | Freq: Every day | ORAL | 0 refills | Status: DC

## 2018-03-12 MED ORDER — METOPROLOL TARTRATE 50 MG PO TABS: 50.0000 mg | ORAL_TABLET | Freq: Two times a day (BID) | ORAL | 0 refills | Status: DC

## 2018-03-12 MED ORDER — ATORVASTATIN CALCIUM 80 MG PO TABS: 80.0000 mg | ORAL_TABLET | Freq: Every day | ORAL | 0 refills | Status: DC

## 2018-03-12 NOTE — Telephone Encounter (Signed)
Rx has been sent to the pharmacy electronically. ° °

## 2018-03-12 NOTE — Telephone Encounter (Signed)
°*  STAT* If patient is at the pharmacy, call can be transferred to refill team.   1. Which medications need to be refilled? (please list name of each medication and dose if known) Lisinopril 5 mg, Metoprolol tartrate 50 mg and Atorvastatin 80 mg   2. Which pharmacy/location (including street and city if local pharmacy) is medication to be sent to? [facility] [facility]   3. Do they need a 30 day or 90 day supply? 90

## 2018-04-30 ENCOUNTER — Ambulatory Visit: Payer: Medicare Other | Admitting: Cardiovascular Disease

## 2018-04-30 ENCOUNTER — Encounter: Payer: Self-pay | Admitting: Cardiovascular Disease

## 2018-04-30 VITALS — BP 120/78 | HR 89 | Ht 73.5 in | Wt 212.0 lb

## 2018-04-30 DIAGNOSIS — Z79899 Other long term (current) drug therapy: Secondary | ICD-10-CM

## 2018-04-30 DIAGNOSIS — I10 Essential (primary) hypertension: Secondary | ICD-10-CM

## 2018-04-30 DIAGNOSIS — Z951 Presence of aortocoronary bypass graft: Secondary | ICD-10-CM | POA: Diagnosis not present

## 2018-04-30 DIAGNOSIS — F17200 Nicotine dependence, unspecified, uncomplicated: Secondary | ICD-10-CM

## 2018-04-30 DIAGNOSIS — E785 Hyperlipidemia, unspecified: Secondary | ICD-10-CM | POA: Diagnosis not present

## 2018-04-30 DIAGNOSIS — I2510 Atherosclerotic heart disease of native coronary artery without angina pectoris: Secondary | ICD-10-CM

## 2018-04-30 MED ORDER — METOPROLOL TARTRATE 75 MG PO TABS: 75.0000 mg | ORAL_TABLET | Freq: Two times a day (BID) | ORAL | 3 refills | Status: DC

## 2018-04-30 MED ORDER — OMEGA-3-ACID ETHYL ESTERS 1 G PO CAPS
1.0000 | ORAL_CAPSULE | Freq: Two times a day (BID) | ORAL | 3 refills | Status: DC
Start: 2018-04-30 — End: 2020-01-02

## 2018-04-30 NOTE — Patient Instructions (Signed)
Medication Instructions:  INCREASE metoprolol tartrate (Lopressor) to 75 mg two times daily  Labwork: Please return for FASTING labs in 1 month (CMET, Lipid)  Our in office lab hours are Monday-Friday 8:00-4:00, closed for lunch 12:45-1:45 pm.  No appointment needed.  Follow-Up: Your physician wants you to follow-up in: 6 months with Dr. Tresa Endo. You will receive a reminder letter in the mail two months in advance. If you don't receive a letter, please call our office to schedule the follow-up appointment.   Any Other Special Instructions Will Be Listed Below (If Applicable).     If you need a refill on your cardiac medications before your next appointment, please call your pharmacy.

## 2018-04-30 NOTE — Progress Notes (Signed)
Patient ID: [name], male   DOB: 01-27-1956, 62 y.o.   MRN: [id number]     HPI: Juan Gates is a 62 y.o. male who presents for a 85-monthfollow-up cardiology evaluation.  Juan Gates a significant history of tobacco use.  He suffered a NSTEMI and underwent cardiac catheterization by Dr. HEllyn Hackon 12/31/2012.  On 01/03/2013 he underwent CABG surgery x2 by Dr. ORicard Dillonwith a right internal mammary artery to the obtuse marginal, and left internal mammary artery to the LAD. He had preserved LV function. He also had hyperlipidemia. Unfortunately the patient has continued to smoke cigarettes.  I  He was married for the third time in September 2017. He has been on disability since his MI in January 2014.  Previously he was in the construction business.  Since I last saw him in November 2017 he denies any episodes of chest pain or shortness of breath.  He does not routinely exercise.  He had undergone laboratory in December 2017's time he was on atorvastatin 80 mg, omega-3 fatty acids, and also was on fenofibrate.  His LDL cholesterol was 91 triglycerides 176.  Zetia was added to his regimen and he was taken off fenofibrate.  He states that he often would eat out at fast food places.  However since Easter he and his wife have significantly changed her diet.  He has lost 7 pounds over the past 2 weeks.  He has markedly reduced his carbohydrate intake and he has avoided fast foods.  He did undergo repeat lab work by his primary physician in February which was abnormal with a total cholesterol 193, LDL 126 and triglycerides 135.  He had normal renal function, thyroid studies, PSA, and CBC.  He continues to smoke cigarettes and has been smoking for over 40 years.  He denies palpitations.  He denies difficulty with sleep.  He presents for evaluation.  Past Medical History:  Diagnosis Date  . Anxiety   . Arthritis    knees, right hip  . Complication of anesthesia    "started fighting when woke  up with tube down throat"  . Coronary artery disease   . Hyperlipidemia   . Hypertension   . Myocardial infarction ([facility] Jan. 6 2014  . 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 CARDIAC CATHETERIZATION  12/31/2012   2 vessel cors 90%hazy stenosis prox circ with long segment 60-70%LAD,mod RCA,severe lesion small marginal branch  . CORONARY ARTERY BYPASS GRAFT  01/03/2013   Procedure: CORONARY ARTERY BYPASS GRAFTING (CABG);  Surgeon: CRexene Alberts MD;  [facility] [facility]  Service: Open Heart Surgery;  Laterality: N/A;  . HERNIA REPAIR  07/22/13   open RIH  . INGUINAL HERNIA REPAIR Left 07/22/2013   Procedure: OPEN LEFT INGUINAL HERNIA REPAIR ;  Surgeon: BEdward Jolly MD;  Location: [facility];  Service: General;  Laterality: Left;  . INSERTION OF MESH Left 07/22/2013   Procedure: INSERTION OF MESH;  Surgeon: BEdward Jolly MD;  Location: [facility];  Service: General;  Laterality: Left;  . INTRAOPERATIVE TRANSESOPHAGEAL ECHOCARDIOGRAM  01/03/2013   Procedure: INTRAOPERATIVE TRANSESOPHAGEAL ECHOCARDIOGRAM;  Surgeon: CRexene Alberts MD;  [place] [facility]  Service: Open Heart Surgery;  Laterality: N/A;  . LEFT HEART CATHETERIZATION WITH CORONARY ANGIOGRAM N/A 01/01/2013   Procedure: LEFT HEART CATHETERIZATION WITH CORONARY ANGIOGRAM;  Surgeon: DLeonie Man MD;  [facility];  Service: Cardiovascular;  Laterality: N/A;    No Known Allergies  Current Outpatient Medications  Medication Sig Dispense Refill  . aspirin 81 MG tablet Take 81 mg by mouth daily.    Marland Kitchen atorvastatin (LIPITOR) 80 MG tablet Take 1 tablet (80 mg total) by mouth daily. NEED OV. 90 tablet 0  . ezetimibe (ZETIA) 10 MG tablet Take 1 tablet (10 mg total) by mouth daily. 90 tablet 3  . HYDROcodone-acetaminophen (NORCO/VICODIN) 5-325 MG tablet Take 1 tablet by mouth every 6 (six) hours as needed for moderate pain.    . hydrOXYzine (ATARAX/VISTARIL) 25  MG tablet Take 25 mg by mouth at bedtime.  2  . lisinopril (PRINIVIL,ZESTRIL) 5 MG tablet Take 1 tablet (5 mg total) by mouth daily. NEED OV. 90 tablet 0  . metoprolol tartrate (LOPRESSOR) 50 MG tablet Take 1 tablet (50 mg total) by mouth 2 (two) times daily. NEED OV. 180 tablet 0  . omega-3 acid ethyl esters (LOVAZA) 1 g capsule TAKE ONE CAPSULE BY MOUTH TWICE DAILY 180 capsule 3  . QUEtiapine (SEROQUEL XR) 400 MG 24 hr tablet Take 1 tablet by mouth at bedtime.    Marland Kitchen QUEtiapine (SEROQUEL) 25 MG tablet Take 25 mg by mouth at bedtime.    . traZODone (DESYREL) 100 MG tablet Take 1 tablet by mouth at bedtime.     No current facility-administered medications for this visit.     Social History   Socioeconomic History  . Marital status: Married    Spouse name: Not on file  . Number of children: Not on file  . Years of education: Not on file  . Highest education level: Not on file  Occupational History  . Not on file  Social Needs  . Financial resource strain: Not on file  . Food insecurity:    Worry: Not on file    Inability: Not on file  . Transportation needs:    Medical: Not on file    Non-medical: Not on file  Tobacco Use  . Smoking status: Current Every Day Smoker    Packs/day: 1.00    Years: 20.00    Pack years: 20.00    Types: Cigarettes  . Smokeless tobacco: Never Used  Substance and Sexual Activity  . Alcohol use: Yes    Comment: beers "every now and then"  . Drug use: No  . Sexual activity: Not on file  Lifestyle  . Physical activity:    Days per week: Not on file    Minutes per session: Not on file  . Stress: Not on file  Relationships  . Social connections:    Talks on phone: Not on file    Gets together: Not on file    Attends religious service: Not on file    Active member of club or organization: Not on file    Attends meetings of clubs or organizations: Not on file    Relationship status: Not on file  . Intimate partner violence:    Fear of current or  ex partner: Not on file    Emotionally abused: Not on file    Physically abused: Not on file    Forced sexual activity: Not on file  Other Topics Concern  . Not on file  Social History Narrative  . Not on file    Family History  Problem Relation Age of Onset  . Hypertension Mother   . Cancer Father        bone cancer  . Cancer Brother  metastatic melanoma   Additional social history is notable that he is single. He has 3 children. He smoking a pack per day.  ROS General: Negative; No fevers, chills, or night sweats;  HEENT: Negative; No changes in vision or hearing, sinus congestion, difficulty swallowing Pulmonary: Negative; No cough, wheezing, shortness of breath, hemoptysis Cardiovascular: Negative; No chest pain, presyncope, syncope, palpitations GI: Negative; No nausea, vomiting, diarrhea, or abdominal pain GU: Negative; No dysuria, hematuria, or difficulty voiding Musculoskeletal: Positive for left leg discomfort.  h/o lipoma of his left thigh; no myalgias, joint pain, or weakness Hematologic/Oncology: Negative; no easy bruising, bleeding Endocrine: Negative; no heat/cold intolerance; no diabetes Neuro: Negative; no changes in balance, headaches Skin: Negative; No rashes or skin lesions Psychiatric: Negative; No behavioral problems, depression Sleep: Negative; No snoring, daytime sleepiness, hypersomnolence, bruxism, restless legs, hypnogognic hallucinations, no cataplexy Other comprehensive 14 point system review is negative.   PE BP 120/78   Pulse 89   Ht 6' 1.5" (1.867 m)   Wt 212 lb (96.2 kg)   BMI 27.59 kg/m    Repeat blood pressure was 124/78  Wt Readings from Last 3 Encounters:  04/30/18 212 lb (96.2 kg)  11/24/16 218 lb 9.6 oz (99.2 kg)  10/30/15 202 lb 6.4 oz (91.8 kg)   General: Alert, oriented, no distress.  Skin: normal turgor, no rashes, warm and dry HEENT: Normocephalic, atraumatic. Pupils equal round and reactive to light; sclera  anicteric; extraocular muscles intact;  Nose without nasal septal hypertrophy Mouth/Parynx benign; Mallinpatti scale 3 Neck: No JVD, no carotid bruits; normal carotid upstroke Lungs: Decreased breath sounds without wheezing or rhonchi. Chest wall: without tenderness to palpitation Heart: PMI not displaced, RRR, s1 s2 normal, 1/6 systolic murmur, no diastolic murmur, no rubs, gallops, thrills, or heaves Abdomen: soft, nontender; no hepatosplenomehaly, BS+; abdominal aorta nontender and not dilated by palpation. Back: no CVA tenderness Pulses 2+ Musculoskeletal: full range of motion, normal strength, no joint deformities Extremities: no clubbing cyanosis or edema, Homan's sign negative  Neurologic: grossly nonfocal; Cranial nerves grossly wnl Psychologic: Normal mood and affect   ECG (independently read by me): Normal sinus rhythm at 89 bpm.  Inferior Q waves consistent with old IMI.  No ectopy.  QTc interval '4 6 9 '$ ms.  November 2017 ECG (independently read by me): Normal sinus rhythm at 65 bpm.  Normal intervals.  No ectopy.    November 2016ECG (independently read by me): normal sinus rhythm at 69 bpm.  PR interval 144 ms, QTc interval 426 ms.  Q waves in 3 and aVF.  November 2015 ECG (independently read by me): Normal sinus rhythm at 84 bpm.  Probable left atrial enlargement.  Probable old inferior MI.  December 2014 ECG: Sinus rhythm at 72 beats per minute. No ectopy.  LABS:  BMP Latest Ref Rng & Units 12/16/2016 07/15/2013 06/24/2013  Glucose 65 - 99 mg/dL 98 96 93  BUN 7 - 25 mg/dL '15 17 10  '$ Creatinine 0.70 - 1.33 mg/dL 0.92 1.00 0.93  Sodium 135 - 146 mmol/L 137 137 140  Potassium 3.5 - 5.3 mmol/L 4.0 4.3 4.3  Chloride 98 - 110 mmol/L 106 102 107  CO2 20 - 31 mmol/L '21 23 25  '$ Calcium 8.6 - 10.3 mg/dL 8.9 9.6 9.3   Hepatic Function Latest Ref Rng & Units 12/16/2016 06/24/2013 01/02/2013  Total Protein 6.1 - 8.1 g/dL 6.6 6.7 7.0  Albumin 3.6 - 5.1 g/dL 4.0 4.5 3.8  AST 10 - 35  U/L 19 23  23  ALT 9 - 46 U/L '19 31 27  '$ Alk Phosphatase 40 - 115 U/L 46 71 66  Total Bilirubin 0.2 - 1.2 mg/dL 0.6 0.5 0.6   CBC Latest Ref Rng & Units 12/16/2016 07/15/2013 05/16/2013  WBC 3.8 - 10.8 K/uL 7.4 9.3 -  Hemoglobin 13.2 - 17.1 g/dL 15.4 15.4 17.0  Hematocrit 38.5 - 50.0 % 46.0 46.1 50.0  Platelets 140 - 400 K/uL 235 262 -   Lab Results  Component Value Date   MCV 97.0 12/16/2016   MCV 94.7 07/15/2013   MCV 91.7 05/16/2013   Lab Results  Component Value Date   TSH 3.27 12/16/2016   Lab Results  Component Value Date   HGBA1C 5.5 01/02/2013   Lipid Panel     Component Value Date/Time   CHOL 159 12/16/2016 0924   TRIG 176 (H) 12/16/2016 0924   HDL 33 (L) 12/16/2016 0924   CHOLHDL 4.8 12/16/2016 0924   VLDL 35 (H) 12/16/2016 0924   LDLCALC 91 12/16/2016 0924     RADIOLOGY: No results found.  IMPRESSION:  1. CAD in native artery   2. S/P CABG x 2, (LIMA-LAD and RIMA-OM) 01/03/13   3. Hyperlipidemia LDL goal <70   4. Essential hypertension   5. Drug therapy     ASSESSMENT AND PLAN: Juan Gates a 62 year-old gentleman who presented to the hospital in January 2014 and ruled in for non-ST segment elevation MI. Catheterization revealed LAD and left circumflex stenoses but he also had a 99% occlusion in a small RV marginal branch of the RCA. On 01/13/2013 he underwent CABG surgery 2 with a RIMA to the OM and LIMA to the LAD.  Unfortunately, he resumed smoking since his bypass surgery. He has mixed hyperlipidemia had been treated with high-dose Lipitor, fenofibrate, and omega-3 fatty acids.  Most recently he has been on atorvastatin 80 mg, Zetia 10 mg, and low vase a 1 capsule twice a day.  I reviewed his recent laboratory by Dr. Luretha Rued.  Lipids were elevated.  Since his lab was drawn, he has completely changed his diet over the past several weeks.  As result, I will recommend we recheck his lipid studies and chemistry profile in 6 weeks after about 2 months  of his dietary change to see if additional treatment is necessary.  If despite high-dose atorvastatin and Zetia his LDL is still significantly elevated, he may be a candidate for PCSK9 inhibition.  His blood pressure today is stable on lisinopril 5 mg and metoprolol 50 mill grams twice a day.  However, he is not well beta blocked with a resting pulse at 89.  I have recommended additional titration of Lopressor to 75 mg twice a day.  Had a long discussion with him concerning the importance of absolute smoking cessation.  We discussed increase exercise.  I will contact him regarding his laboratory in 6 weeks.  In 6 months I will see him back in the office for follow-up evaluation or sooner if we need to consider institution of PCSK9 inhibition or if additional problems arise.  Time spent: 25 minutes  Troy Sine, MD, [facility]  04/30/2018 4:01 PM

## 2018-05-15 ENCOUNTER — Other Ambulatory Visit: Payer: Self-pay | Admitting: Cardiovascular Disease

## 2018-05-17 ENCOUNTER — Telehealth: Payer: Self-pay | Admitting: Cardiovascular Disease

## 2018-05-17 MED ORDER — METOPROLOL TARTRATE 50 MG PO TABS: 75.0000 mg | ORAL_TABLET | Freq: Two times a day (BID) | ORAL | 3 refills | Status: DC

## 2018-05-17 NOTE — Telephone Encounter (Signed)
New Message   Pt c/o medication issue:  1. Name of Medication: metoprolol tartrate 75 MG TABS   2. How are you currently taking this medication (dosage and times per day)?   3. Are you having a reaction (difficulty breathing--STAT)?   4. What is your medication issue? Patients wife is calling on behalf of spouse. She states that they used [facility] order for rx and they do not have the  tabs available. She states that [facility] is request the rx be written as a  as 1 tablet and 1/2 per day. Please call to discuss.

## 2018-05-17 NOTE — Telephone Encounter (Signed)
Left message for patient wife, new script sent to the pharmacy

## 2018-07-03 LAB — LIPID PANEL
CHOL/HDL RATIO: 3.4 ratio (ref 0.0–5.0)
CHOLESTEROL TOTAL: 131 mg/dL (ref 100–199)
HDL: 38 mg/dL — ABNORMAL LOW (ref >39)
LDL CALC: 72 mg/dL (ref 0–99)
TRIGLYCERIDES: 105 mg/dL (ref 0–149)
VLDL CHOLESTEROL CAL: 21 mg/dL (ref 5–40)

## 2018-07-03 LAB — COMPREHENSIVE METABOLIC PANEL
ALK PHOS: 70 IU/L (ref 39–117)
ALT: 55 IU/L — ABNORMAL HIGH (ref 0–44)
AST: 38 IU/L (ref 0–40)
Albumin/Globulin Ratio: 1.8 (ref 1.2–2.2)
Albumin: 4.3 g/dL (ref 3.6–4.8)
BUN/Creatinine Ratio: 20 (ref 10–24)
BUN: 19 mg/dL (ref 8–27)
Bilirubin Total: 0.4 mg/dL (ref 0.0–1.2)
CALCIUM: 9.4 mg/dL (ref 8.6–10.2)
CO2: 22 mmol/L (ref 20–29)
CREATININE: 0.93 mg/dL (ref 0.76–1.27)
Chloride: 104 mmol/L (ref 96–106)
GFR calc Af Amer: 103 mL/min/{1.73_m2} (ref >59)
GFR, EST NON AFRICAN AMERICAN: 89 mL/min/{1.73_m2} (ref >59)
Globulin, Total: 2.4 g/dL (ref 1.5–4.5)
Glucose: 98 mg/dL (ref 65–99)
Potassium: 4.4 mmol/L (ref 3.5–5.2)
Sodium: 140 mmol/L (ref 134–144)
Total Protein: 6.7 g/dL (ref 6.0–8.5)

## 2018-07-04 ENCOUNTER — Other Ambulatory Visit: Payer: Self-pay | Admitting: *Deleted

## 2018-07-04 DIAGNOSIS — Z79899 Other long term (current) drug therapy: Secondary | ICD-10-CM

## 2018-07-04 DIAGNOSIS — R945 Abnormal results of liver function studies: Principal | ICD-10-CM

## 2018-07-04 DIAGNOSIS — R7989 Other specified abnormal findings of blood chemistry: Secondary | ICD-10-CM

## 2018-07-18 ENCOUNTER — Other Ambulatory Visit: Payer: Self-pay | Admitting: Cardiovascular Disease

## 2018-07-19 NOTE — Telephone Encounter (Signed)
Rx request sent to pharmacy.  

## 2018-09-25 ENCOUNTER — Other Ambulatory Visit: Payer: Self-pay | Admitting: Cardiovascular Disease

## 2018-11-01 ENCOUNTER — Telehealth: Payer: Self-pay | Admitting: Cardiovascular Disease

## 2018-11-01 MED ORDER — METOPROLOL TARTRATE 50 MG PO TABS: 75.0000 mg | ORAL_TABLET | Freq: Two times a day (BID) | ORAL | 0 refills | Status: DC

## 2018-11-01 NOTE — Telephone Encounter (Signed)
New Message ° ° ° ° °*STAT* If patient is at the pharmacy, call can be transferred to refill team. ° ° °1. Which medications need to be refilled? (please list name of each medication and dose if known) metoprolol tartrate (LOPRESSOR) 50 MG tablet ° °2. Which pharmacy/location (including street and city if local pharmacy) is medication to be sent to? [facility] - [place], [place] - [address] ° °3. Do they need a 30 day or 90 day supply? 90  ° °

## 2019-01-08 ENCOUNTER — Other Ambulatory Visit: Payer: Self-pay | Admitting: Cardiovascular Disease

## 2019-02-18 ENCOUNTER — Ambulatory Visit: Payer: Medicare PPO | Admitting: Cardiovascular Disease

## 2019-03-04 ENCOUNTER — Encounter: Payer: Self-pay | Admitting: Physician Assistant

## 2019-03-04 ENCOUNTER — Ambulatory Visit: Payer: Medicare PPO | Admitting: Physician Assistant

## 2019-03-04 VITALS — BP 108/66 | HR 69 | Ht 73.5 in | Wt 223.0 lb

## 2019-03-04 DIAGNOSIS — I10 Essential (primary) hypertension: Secondary | ICD-10-CM

## 2019-03-04 DIAGNOSIS — E785 Hyperlipidemia, unspecified: Secondary | ICD-10-CM | POA: Diagnosis not present

## 2019-03-04 DIAGNOSIS — I25810 Atherosclerosis of coronary artery bypass graft(s) without angina pectoris: Secondary | ICD-10-CM

## 2019-03-04 DIAGNOSIS — Z720 Tobacco use: Secondary | ICD-10-CM

## 2019-03-04 NOTE — Patient Instructions (Signed)
Medication Instructions:  Your physician recommends that you continue on your current medications as directed. Please refer to the Current Medication list given to you today.  If you need a refill on your cardiac medications before your next appointment, please call your pharmacy.   Lab work:  You will need to return to have labs (blood work) drawn prior to your follow up visit for Lipid  If you have labs (blood work) drawn today and your tests are completely normal, you will receive your results only by: Marland Kitchen MyChart Message (if you have MyChart) OR . A paper copy in the mail If you have any lab test that is abnormal or we need to change your treatment, we will call you to review the results.  Testing/Procedures: NONE  Follow-Up: At [facility], you and your health needs are our priority.  As part of our continuing mission to provide you with exceptional heart care, we have created designated Provider Care Teams.  These Care Teams include your primary Cardiologist (physician) and Advanced Practice Providers (APPs -  Physician Assistants and Nurse Practitioners) who all work together to provide you with the care you need, when you need it. You will need a follow up appointment in 5-6 months.  Please call our office 2 months in advance to schedule this appointment.  You may see Nicki Guadalajara, MD or one of the following Advanced Practice Providers on your designated Care Team: [place], [place] . Micah Flesher, PA-C  Any Other Special Instructions Will Be Listed Below (If Applicable).

## 2019-03-04 NOTE — Progress Notes (Signed)
Cardiology Office Note    Date:  03/05/2019   ID:  Juan Gates, DOB 29-Jul-1956, MRN [id number]  PCP:  Fleet Contras, MD  Cardiologist: Dr. Tresa Endo  Chief Complaint  Patient presents with  . Follow-up    seen for Dr. Tresa Endo    History of Present Illness:  Juan Gates is a 63 y.o. male with PMH of CAD s/p CABG, HTN, tobacco abuse and HLD.  He had a NSTEMI in January 2014 and underwent CABG x2 by Dr. Cornelius Moras with RIMA to OM, LIMA to LAD.  EF was 55-60% by echo at the time. He has been on disability since CABG in January 2014.  Due to the uncontrolled cholesterol, fenofibrate was added to Lipitor and omega-3 fatty acid.  Last lipid panel obtained in July 2019 showed LDL improved to 72.  Patient presents today for cardiology office visit.  Unfortunately he continues to smoke at this point at about a 1 pack/day.  He denies any chest pain or shortness of breath.  On physical exam, he has diffused breath sound throughout the lung consistent with history of smoking.  We discussed the importance of tobacco cessation.  Otherwise, his most recent lab work that was brought by the patient showed uncontrolled triglyceride and LDL.  He says due to cost reasons, he was not eating very healthy recently.  He want to give diet control another try.  I will continue him on the current medication and recheck fasting lipid panel prior to his next visit with Dr. Tresa Endo.   Past Medical History:  Diagnosis Date  . Anxiety   . Arthritis    knees, right hip  . Complication of anesthesia    "started fighting when woke up with tube down throat"  . Coronary artery disease   . Hyperlipidemia   . Hypertension   . Myocardial infarction [facility]) Jan. 6 2014  . 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 CARDIAC CATHETERIZATION  12/31/2012   2 vessel cors 90%hazy stenosis prox circ with long segment 60-70%LAD,mod RCA,severe lesion small marginal  branch  . CORONARY ARTERY BYPASS GRAFT  01/03/2013   Procedure: CORONARY ARTERY BYPASS GRAFTING (CABG);  Surgeon: Purcell Nails, MD;  Location[facility] OR;  Service: Open Heart Surgery;  Laterality: N/A;  . HERNIA REPAIR  07/22/13   open RIH  . INGUINAL HERNIA REPAIR Left 07/22/2013   Procedure: OPEN LEFT INGUINAL HERNIA REPAIR ;  Surgeon: Mariella Saa, MD;  Location: [facility] ORS;  Service: General;  Laterality: Left;  . INSERTION OF MESH Left 07/22/2013   Procedure: INSERTION OF MESH;  Surgeon: Mariella Saa, MD;  Location: [facility] ORS;  Service: General;  Laterality: Left;  . INTRAOPERATIVE TRANSESOPHAGEAL ECHOCARDIOGRAM  01/03/2013   Procedure: INTRAOPERATIVE TRANSESOPHAGEAL ECHOCARDIOGRAM;  Surgeon: Purcell Nails, MD;  Location: [facility] OR;  Service: Open Heart Surgery;  Laterality: N/A;  . LEFT HEART CATHETERIZATION WITH CORONARY ANGIOGRAM N/A 01/01/2013   Procedure: LEFT HEART CATHETERIZATION WITH CORONARY ANGIOGRAM;  Surgeon: [name], MD;  Location: [facility] CATH LAB;  Service: Cardiovascular;  Laterality: N/A;    Current Medications: Outpatient Medications Prior to Visit  Medication Sig Dispense Refill  . aspirin 81 MG tablet Take 81 mg by mouth daily.    Marland Kitchen atorvastatin (LIPITOR) 80 MG tablet TAKE 1 TABLET EVERY DAY 90 tablet 1  . ezetimibe (ZETIA) 10 MG tablet TAKE 1 TABLET (10 MG  TOTAL) BY MOUTH DAILY. 90 tablet 3  . HYDROcodone-acetaminophen (NORCO/VICODIN) 5-325 MG tablet Take 1 tablet by mouth every 6 (six) hours as needed for moderate pain.    . hydrOXYzine (ATARAX/VISTARIL) 25 MG tablet Take 25 mg by mouth at bedtime.  2  . lisinopril (PRINIVIL,ZESTRIL) 5 MG tablet Take 1 tablet (5 mg total) by mouth daily. 90 tablet 3  . metoprolol tartrate (LOPRESSOR) 50 MG tablet Take 1.5 tablets (75 mg total) by mouth 2 (two) times daily. 270 tablet 0  . omega-3 acid ethyl esters (LOVAZA) 1 g capsule Take 1 capsule (1 g total) by mouth 2 (two) times daily. 180 capsule 3  . QUEtiapine (SEROQUEL XR)  400 MG 24 hr tablet Take 1 tablet by mouth at bedtime.    Marland Kitchen QUEtiapine (SEROQUEL) 25 MG tablet Take 25 mg by mouth at bedtime.    . traZODone (DESYREL) 100 MG tablet Take 1 tablet by mouth at bedtime.     No facility-administered medications prior to visit.      Allergies:   Patient has no known allergies.   Social History   Socioeconomic History  . Marital status: Married    Spouse name: Not on file  . Number of children: Not on file  . Years of education: Not on file  . Highest education level: Not on file  Occupational History  . Not on file  Social Needs  . Financial resource strain: Not on file  . Food insecurity:    Worry: Not on file    Inability: Not on file  . Transportation needs:    Medical: Not on file    Non-medical: Not on file  Tobacco Use  . Smoking status: Current Every Day Smoker    Packs/day: 1.00    Years: 20.00    Pack years: 20.00    Types: Cigarettes  . Smokeless tobacco: Never Used  Substance and Sexual Activity  . Alcohol use: Yes    Comment: beers "every now and then"  . Drug use: No  . Sexual activity: Not on file  Lifestyle  . Physical activity:    Days per week: Not on file    Minutes per session: Not on file  . Stress: Not on file  Relationships  . Social connections:    Talks on phone: Not on file    Gets together: Not on file    Attends religious service: Not on file    Active member of club or organization: Not on file    Attends meetings of clubs or organizations: Not on file    Relationship status: Not on file  Other Topics Concern  . Not on file  Social History Narrative  . Not on file     Family History:  The patient's family history includes Cancer in his brother and father; Hypertension in his mother.   ROS:   Please see the history of present illness.    ROS All other systems reviewed and are negative.   PHYSICAL EXAM:   VS:  BP 108/66   Pulse 69   Ht 6' 1.5" (1.867 m)   Wt 223 lb (101.2 kg)   BMI 29.02  kg/m    GEN: Well nourished, well developed, in no acute distress  HEENT: normal  Neck: no JVD, carotid bruits, or masses Cardiac: RRR; no murmurs, rubs, or gallops,no edema  Respiratory:  clear to auscultation bilaterally, normal work of breathing GI: soft, nontender, nondistended, + BS MS: no deformity or atrophy  Skin: warm and dry, no rash Neuro:  Alert and Oriented x 3, Strength and sensation are intact Psych: euthymic mood, full affect  Wt Readings from Last 3 Encounters:  03/04/19 223 lb (101.2 kg)  04/30/18 212 lb (96.2 kg)  11/24/16 218 lb 9.6 oz (99.2 kg)      Studies/Labs Reviewed:   EKG:  EKG is ordered today.  The ekg ordered today demonstrates normal sinus rhythm with poor R wave progression anterior leads, Q waves in the inferior lead.  Recent Labs: 07/03/2018: ALT 55; BUN 19; Creatinine, Ser 0.93; Potassium 4.4; Sodium 140   Lipid Panel    Component Value Date/Time   CHOL 131 07/03/2018 0820   TRIG 105 07/03/2018 0820   HDL 38 (L) 07/03/2018 0820   CHOLHDL 3.4 07/03/2018 0820   CHOLHDL 4.8 12/16/2016 0924   VLDL 35 (H) 12/16/2016 0924   LDLCALC 72 07/03/2018 0820    Additional studies/ records that were reviewed today include:   Echo 01/02/2013 LV EF: 55% -  60%  ------------------------------------------------------------ History:  PMH: NSTEMI PMH:  Myocardial infarction. Risk factors: Current tobacco use. Dyslipidemia.  ------------------------------------------------------------ Study Conclusions  - Left ventricle: The cavity size was normal. Wall thickness was normal. Systolic function was normal. The estimated ejection fraction was in the range of 55% to 60%. Mild hypokinesis of the mid-distallateral myocardium. Left ventricular diastolic function parameters were normal. - Left atrium: The atrium was mildly dilated.    ASSESSMENT:    1. Coronary artery disease involving coronary bypass graft of native heart without angina  pectoris   2. Hyperlipidemia LDL goal <70   3. Essential hypertension   4. Tobacco abuse      PLAN:  In order of problems listed above:  1. CAD s/p CABG: On aspirin and Lipitor.  Denies any recent chest discomfort  2. Hyperlipidemia: Recent lipid panel showed uncontrolled LDL, LDL goal should be less than 70.  Patient admits he has not been eating very well due to cost reasons.  He plan to give diet and activity a try.  Continue on Lipitor 80 mg daily and add Zetia.  Will need to repeat lipid panel prior to the next visit.  If LDL remains uncontrolled, consider PCSK9 inhibitor  3. Hypertension: Blood pressure stable on current therapy  4. Tobacco abuse: Discussed the importance of tobacco cessation.     Medication Adjustments/Labs and Tests Ordered: Current medicines are reviewed at length with the patient today.  Concerns regarding medicines are outlined above.  Medication changes, Labs and Tests ordered today are listed in the Patient Instructions below. Patient Instructions  Medication Instructions:  Your physician recommends that you continue on your current medications as directed. Please refer to the Current Medication list given to you today.  If you need a refill on your cardiac medications before your next appointment, please call your pharmacy.   Lab work:  You will need to return to have labs (blood work) drawn prior to your follow up visit for Lipid  If you have labs (blood work) drawn today and your tests are completely normal, you will receive your results only by: Marland Kitchen MyChart Message (if you have MyChart) OR . A paper copy in the mail If you have any lab test that is abnormal or we need to change your treatment, we will call you to review the results.  Testing/Procedures: NONE  Follow-Up: At [facility], you and your health needs are our priority.  As part of our continuing mission to  provide you with exceptional heart care, we have created designated Provider  Care Teams.  These Care Teams include your primary Cardiologist (physician) and Advanced Practice Providers (APPs -  Physician Assistants and Nurse Practitioners) who all work together to provide you with the care you need, when you need it. You will need a follow up appointment in 5-6 months.  Please call our office 2 months in advance to schedule this appointment.  You may see Nicki Guadalajara, MD or one of the following Advanced Practice Providers on your designated Care Team: [place], [place] . Micah Flesher, PA-C  Any Other Special Instructions Will Be Listed Below (If Applicable).      Ramond Dial, [place]  03/05/2019 8:47 AM    [facility] [address] [place], [place], [place]  [address] Phone: [phone]; Fax: [phone]

## 2019-03-05 ENCOUNTER — Encounter: Payer: Self-pay | Admitting: Physician Assistant

## 2019-06-17 ENCOUNTER — Other Ambulatory Visit: Payer: Self-pay | Admitting: Cardiovascular Disease

## 2019-10-22 ENCOUNTER — Telehealth: Payer: Self-pay | Admitting: Cardiovascular Disease

## 2019-10-22 NOTE — Telephone Encounter (Signed)
Message sent to MD- will have him advise if okay to switch 11:20 appointment to virtual.  Or reschedule. Thank you!

## 2019-10-22 NOTE — Telephone Encounter (Signed)
New Message     Pt is calling about his appt on 10/30  He says he does not feel comfortable to come in and would like to change it to virtual    Please call back

## 2019-10-23 NOTE — Telephone Encounter (Signed)
If the patient does not want to be seen in the office and wants a virtual visit do not schedule this for Friday, October 30.  Consider changing his appointment to possibly tomorrow morning since I am virtual tomorrow morning or reschedule him to another half day when I am doing virtual visits.  It is very inefficient to do virtual visits the same day I have a overbooked inpatient office schedule

## 2019-10-25 ENCOUNTER — Ambulatory Visit: Payer: Medicare PPO | Admitting: Cardiovascular Disease

## 2019-10-28 NOTE — Progress Notes (Signed)
Virtual Visit via Video Note   This visit type was conducted due to national recommendations for restrictions regarding the COVID-19 Pandemic (e.g. social distancing) in an effort to limit this patient's exposure and mitigate transmission in our community.  Due to his co-morbid illnesses, this patient is at least at moderate risk for complications without adequate follow up.  This format is felt to be most appropriate for this patient at this time.  All issues noted in this document were discussed and addressed.  A limited physical exam was performed with this format.  Please refer to the patient's chart for his consent to telehealth for [facility].   Date:  10/29/2019   ID:  Juan Plowmananiel Gates, DOB 03/11/1956, MRN [id number]  Patient Location: Home Provider Location: Home  PCP:  Fleet ContrasAvbuere, Edwin, MD  Cardiologist:  Nicki Guadalajarahomas Kelly, MD  Electrophysiologist:  None   Evaluation Performed:  Follow-Up Visit  Chief Complaint:  CAD, HTN, smoking  History of Present Illness:    Juan Gates is a 63 y.o. male with CAD s/p CABG x 2 (LIMA-LAD, RIMA-OM 2014, following NSTEMI), HLD, HTN, and history of tobacco use. HLD managed with statin, fenofibrate, and omega-3 fatty acid. LDL in 2019 72. He was last seen by Azalee CourseHao Meng PAC on 03/04/19. At that time, he brought recent labs showing uncontrolled triglycerides and LDL - he admittedly had been eating poorly.   He presents today for follow up. He has been taking lipitor 80 mg, zetia, and omega-3 fish oil. He has not had repeat labs yet. He stays active, although he doesn't work due to disability. He denies anginal symptoms. He has no complaints. Overall, doing very well.   The patient does not have symptoms concerning for COVID-19 infection (fever, chills, cough, or new shortness of breath).    Past Medical History:  Diagnosis Date   Anxiety    Arthritis    knees, right hip   Complication of anesthesia    "started fighting when woke up with tube  down throat"   Coronary artery disease    Hyperlipidemia    Hypertension    Myocardial infarction [facility] Jan. 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   LIMA to LAD, RIMA to OM1   Past Surgical History:  Procedure Laterality Date   CARDIAC CATHETERIZATION  12/31/2012   2 vessel cors 90%hazy stenosis prox circ with long segment 60-70%LAD,mod RCA,severe lesion small marginal branch   CORONARY ARTERY BYPASS GRAFT  01/03/2013   Procedure: CORONARY ARTERY BYPASS GRAFTING (CABG);  Surgeon: Purcell Nailslarence H Owen, MD;  [facility];  Service: Open Heart Surgery;  Laterality: N/A;   HERNIA REPAIR  07/22/13   open RIH   INGUINAL HERNIA REPAIR Left 07/22/2013   Procedure: OPEN LEFT INGUINAL HERNIA REPAIR ;  Surgeon: Mariella SaaBenjamin T Hoxworth, MD;  Location: [facility] ORS;  Service: General;  Laterality: Left;   INSERTION OF MESH Left 07/22/2013   Procedure: INSERTION OF MESH;  Surgeon: Mariella SaaBenjamin T Hoxworth, MD;  Location: [facility] ORS;  Service: General;  Laterality: Left;   INTRAOPERATIVE TRANSESOPHAGEAL ECHOCARDIOGRAM  01/03/2013   Procedure: INTRAOPERATIVE TRANSESOPHAGEAL ECHOCARDIOGRAM;  Surgeon: Purcell Nailslarence H Owen, MD;  Location: [facility] OR;  Service: Open Heart Surgery;  Laterality: N/A;   LEFT HEART CATHETERIZATION WITH CORONARY ANGIOGRAM N/A 01/01/2013   Procedure: LEFT HEART CATHETERIZATION WITH CORONARY ANGIOGRAM;  Surgeon: Marykay Lexavid W Harding, MD;  [facility];  Service: Cardiovascular;  Laterality: N/A;     Current Meds  Medication Sig  aspirin 81 MG tablet Take 81 mg by mouth daily.   atorvastatin (LIPITOR) 80 MG tablet TAKE 1 TABLET EVERY DAY   HYDROcodone-acetaminophen (NORCO/VICODIN) 5-325 MG tablet Take 1 tablet by mouth every 6 (six) hours as needed for moderate pain.   hydrOXYzine (ATARAX/VISTARIL) 25 MG tablet Take 25 mg by mouth at bedtime.   lisinopril (ZESTRIL) 5 MG tablet TAKE 1 TABLET (5 MG TOTAL) BY MOUTH DAILY.   metoprolol tartrate (LOPRESSOR) 50 MG tablet TAKE 1 AND 1/2 TABLETS (75 MG TOTAL)   TWO TIMES DAILY   omega-3 acid ethyl esters (LOVAZA) 1 g capsule Take 1 capsule (1 g total) by mouth 2 (two) times daily.   QUEtiapine (SEROQUEL XR) 400 MG 24 hr tablet Take 1 tablet by mouth at bedtime.   QUEtiapine (SEROQUEL) 25 MG tablet Take 25 mg by mouth at bedtime.   traZODone (DESYREL) 100 MG tablet Take 1 tablet by mouth at bedtime.     Allergies:   Patient has no known allergies.   Social History   Tobacco Use   Smoking status: Current Every Day Smoker    Packs/day: 1.00    Years: 20.00    Pack years: 20.00    Types: Cigarettes   Smokeless tobacco: Never Used  Substance Use Topics   Alcohol use: Yes    Comment: beers "every now and then"   Drug use: No     Family Hx: The patient's family history includes Cancer in his brother and father; Hypertension in his mother.  ROS:   Please see the history of present illness.     All other systems reviewed and are negative.   Prior CV studies:   The following studies were reviewed today:  Echo 2014 Study Conclusions   - Left ventricle: The cavity size was normal. Wall thickness  was normal. Systolic function was normal. The estimated  ejection fraction was in the range of 55% to 60%. Mild  hypokinesis of the mid-distallateral myocardium. Left  ventricular diastolic function parameters were normal.  - Left atrium: The atrium was mildly dilated.   Labs/Other Tests and Data Reviewed:    EKG:  No ECG reviewed.  Recent Labs: No results found for requested labs within last 8760 hours.   Recent Lipid Panel Lab Results  Component Value Date/Time   CHOL 131 07/03/2018 08:20 AM   TRIG 105 07/03/2018 08:20 AM   HDL 38 (L) 07/03/2018 08:20 AM   CHOLHDL 3.4 07/03/2018 08:20 AM   CHOLHDL 4.8 12/16/2016 09:24 AM   LDLCALC 72 07/03/2018 08:20 AM    Wt Readings from Last 3 Encounters:  10/29/19 217 lb (98.4 kg)  03/04/19 223 lb (101.2 kg)  04/30/18 212 lb (96.2 kg)     Objective:    Vital Signs:   BP 127/79    Ht 6' 1.5" (1.867 m)    Wt 217 lb (98.4 kg)    BMI 28.24 kg/m    VITAL SIGNS:  reviewed GEN:  no acute distress EYES:  sclerae anicteric, EOMI - Extraocular Movements Intact RESPIRATORY:  normal respiratory effort, symmetric expansion CARDIOVASCULAR:  no peripheral edema SKIN:  no rash, lesions or ulcers. MUSCULOSKELETAL:  no obvious deformities. NEURO:  alert and oriented x 3, no obvious focal deficit PSYCH:  normal affect  ASSESSMENT & PLAN:    CAD s/p CABG x 2 (2014) He is doing well. He is not working due to disability, but is staying active. Denies anginal symptoms.  I will collect A1c, hasn't been checked  in several years.    Hyperlipidemia Last lipid panel 06/2018 with LDL 72 and Triglycerides 105. Will collect a fasting lipid panel and CMP.   Hypertension Pressure is well controlled. Continue BB and ACEI.    Ongoing tobacco use We discussed chantix. He wants to think about it. He will discuss with his PCP at upcoming appt.      COVID-19 Education: The signs and symptoms of COVID-19 were discussed with the patient and how to seek care for testing (follow up with PCP or arrange E-visit).  The importance of social distancing was discussed today.  Time:   Today, I have spent 14 minutes with the patient with telehealth technology discussing the above problems.     Medication Adjustments/Labs and Tests Ordered: Current medicines are reviewed at length with the patient today.  Concerns regarding medicines are outlined above.   Tests Ordered: Orders Placed This Encounter  Procedures   Comprehensive metabolic panel   Hemoglobin A1c    Medication Changes: No orders of the defined types were placed in this encounter.   Follow Up:  Either In Person or Virtual in 6 month(s)  Signed, Marcelino Duster, [place]  10/29/2019 11:26 AM    [facility]

## 2019-10-28 NOTE — Telephone Encounter (Signed)
Patient rescheduled with PA on 11/03

## 2019-10-29 ENCOUNTER — Encounter: Payer: Self-pay | Admitting: Physician Assistant

## 2019-10-29 ENCOUNTER — Telehealth (INDEPENDENT_AMBULATORY_CARE_PROVIDER_SITE_OTHER): Payer: Medicare PPO | Admitting: Physician Assistant

## 2019-10-29 VITALS — BP 127/79 | Ht 73.5 in | Wt 217.0 lb

## 2019-10-29 DIAGNOSIS — I214 Non-ST elevation (NSTEMI) myocardial infarction: Secondary | ICD-10-CM | POA: Diagnosis not present

## 2019-10-29 DIAGNOSIS — I10 Essential (primary) hypertension: Secondary | ICD-10-CM

## 2019-10-29 DIAGNOSIS — E7800 Pure hypercholesterolemia, unspecified: Secondary | ICD-10-CM | POA: Diagnosis not present

## 2019-10-29 DIAGNOSIS — I2510 Atherosclerotic heart disease of native coronary artery without angina pectoris: Secondary | ICD-10-CM | POA: Diagnosis not present

## 2019-10-29 DIAGNOSIS — Z720 Tobacco use: Secondary | ICD-10-CM

## 2019-10-29 NOTE — Patient Instructions (Signed)
Medication Instructions:  Your physician recommends that you continue on your current medications as directed. Please refer to the Current Medication list given to you today.  *If you need a refill on your cardiac medications before your next appointment, please call your pharmacy*  Lab Work: Your physician recommends that you return for a FASTING lipid profile, CMET, and Hemoglobin A1C at your earliest convenience. You do not need an appointment for blood work done in our office. I will mail your lab slips to you along with a copy of this AVS. Please bring your lab slips with you when you come in for blood work.  If you have labs (blood work) drawn today and your tests are completely normal, you will receive your results only by: Marland Kitchen MyChart Message (if you have MyChart) OR . A paper copy in the mail If you have any lab test that is abnormal or we need to change your treatment, we will call you to review the results.   Follow-Up: At [facility], you and your health needs are our priority.  As part of our continuing mission to provide you with exceptional heart care, we have created designated Provider Care Teams.  These Care Teams include your primary Cardiologist (physician) and Advanced Practice Providers (APPs -  Physician Assistants and Nurse Practitioners) who all work together to provide you with the care you need, when you need it.  Your next appointment:   6 months  The format for your next appointment:   In Person  Provider:   You may see Shelva Majestic, MD or one of the following Advanced Practice Providers on your designated Care Team:    Almyra Deforest, PA-C  Fabian Sharp, [place] or   Roby Lofts, [place]  Other Instructions Please call our office 2 months ahead of time to schedule your follow-up with Dr. Claiborne Billings.

## 2019-11-13 LAB — COMPREHENSIVE METABOLIC PANEL
ALT: 47 IU/L — ABNORMAL HIGH (ref 0–44)
AST: 33 IU/L (ref 0–40)
Albumin/Globulin Ratio: 1.7 (ref 1.2–2.2)
Albumin: 4.4 g/dL (ref 3.8–4.8)
Alkaline Phosphatase: 80 IU/L (ref 39–117)
BUN/Creatinine Ratio: 14 (ref 10–24)
BUN: 16 mg/dL (ref 8–27)
Bilirubin Total: 0.5 mg/dL (ref 0.0–1.2)
CO2: 23 mmol/L (ref 20–29)
Calcium: 9.6 mg/dL (ref 8.6–10.2)
Chloride: 103 mmol/L (ref 96–106)
Creatinine, Ser: 1.15 mg/dL (ref 0.76–1.27)
GFR calc Af Amer: 79 mL/min/{1.73_m2} (ref >59)
GFR calc non Af Amer: 68 mL/min/{1.73_m2} (ref >59)
Globulin, Total: 2.6 g/dL (ref 1.5–4.5)
Glucose: 90 mg/dL (ref 65–99)
Potassium: 4.4 mmol/L (ref 3.5–5.2)
Sodium: 140 mmol/L (ref 134–144)
Total Protein: 7 g/dL (ref 6.0–8.5)

## 2019-11-13 LAB — LIPID PANEL
Chol/HDL Ratio: 4.1 ratio (ref 0.0–5.0)
Cholesterol, Total: 177 mg/dL (ref 100–199)
HDL: 43 mg/dL (ref >39)
LDL Chol Calc (NIH): 101 mg/dL — ABNORMAL HIGH (ref 0–99)
Triglycerides: 193 mg/dL — ABNORMAL HIGH (ref 0–149)
VLDL Cholesterol Cal: 33 mg/dL (ref 5–40)

## 2019-11-13 LAB — HEMOGLOBIN A1C
Est. average glucose Bld gHb Est-mCnc: 111 mg/dL
Hgb A1c MFr Bld: 5.5 % (ref 4.8–5.6)

## 2019-12-19 ENCOUNTER — Other Ambulatory Visit: Payer: Self-pay | Admitting: Cardiovascular Disease

## 2020-01-02 ENCOUNTER — Other Ambulatory Visit: Payer: Self-pay

## 2020-01-02 ENCOUNTER — Ambulatory Visit (INDEPENDENT_AMBULATORY_CARE_PROVIDER_SITE_OTHER): Payer: Medicare PPO | Admitting: Pharmacist

## 2020-01-02 ENCOUNTER — Encounter (INDEPENDENT_AMBULATORY_CARE_PROVIDER_SITE_OTHER): Payer: Self-pay

## 2020-01-02 VITALS — BP 124/78 | HR 88 | Resp 15 | Ht 73.5 in | Wt 225.0 lb

## 2020-01-02 DIAGNOSIS — F17200 Nicotine dependence, unspecified, uncomplicated: Secondary | ICD-10-CM | POA: Diagnosis not present

## 2020-01-02 DIAGNOSIS — E7800 Pure hypercholesterolemia, unspecified: Secondary | ICD-10-CM

## 2020-01-02 MED ORDER — ROSUVASTATIN CALCIUM 40 MG PO TABS: 40.0000 mg | ORAL_TABLET | Freq: Every day | ORAL | 1 refills | Status: DC

## 2020-01-02 MED ORDER — OMEGA-3-ACID ETHYL ESTERS 1 G PO CAPS: 2.0000 g | ORAL_CAPSULE | Freq: Two times a day (BID) | ORAL | 1 refills | Status: AC

## 2020-01-02 MED ORDER — NICOTINE 14 MG/24HR TD PT24: 14.0000 mg | MEDICATED_PATCH | Freq: Every day | TRANSDERMAL | 0 refills | Status: DC

## 2020-01-02 MED ORDER — NICOTINE 7 MG/24HR TD PT24: 7.0000 mg | MEDICATED_PATCH | Freq: Every day | TRANSDERMAL | 0 refills | Status: DC

## 2020-01-02 MED ORDER — NICOTINE 21 MG/24HR TD PT24: 21.0000 mg | MEDICATED_PATCH | Freq: Every day | TRANSDERMAL | 0 refills | Status: DC

## 2020-01-02 NOTE — Patient Instructions (Addendum)
Lipid Clinic (pharmacist) [phone] *STOP taking atorvastatin 80mg  every morning* *START taking rosuvastatin 40mg  every evening* *INCREASE omega-3 to 2 capsules twice daily* *START nicotine patch therapy (21MG  DAILY X 28 DAYS, THEN 14MG  X 28 DAYS, THEN 7MG  DAILY FOR 28 DAYS, THEN STOP*    High Cholesterol  High cholesterol is a condition in which the blood has high levels of a white, waxy, fat-like substance (cholesterol). The human body needs small amounts of cholesterol. The liver makes all the cholesterol that the body needs. Extra (excess) cholesterol comes from the food that we eat. Cholesterol is carried from the liver by the blood through the blood vessels. If you have high cholesterol, deposits (plaques) may build up on the walls of your blood vessels (arteries). Plaques make the arteries narrower and stiffer. Cholesterol plaques increase your risk for heart attack and stroke. Work with your health care provider to keep your cholesterol levels in a healthy range. What increases the risk? This condition is more likely to develop in people who:  Eat foods that are high in animal fat (saturated fat) or cholesterol.  Are overweight.  Are not getting enough exercise.  Have a family history of high cholesterol. What are the signs or symptoms? There are no symptoms of this condition. How is this diagnosed? This condition may be diagnosed from the results of a blood test.  If you are older than age 58, your health care provider may check your cholesterol every 4-6 years.  You may be checked more often if you already have high cholesterol or other risk factors for heart disease. The blood test for cholesterol measures:  "Bad" cholesterol (LDL cholesterol). This is the main type of cholesterol that causes heart disease. The desired level for LDL is less than 100.  "Good" cholesterol (HDL cholesterol). This type helps to protect against heart disease by cleaning the arteries and  carrying the LDL away. The desired level for HDL is 60 or higher.  Triglycerides. These are fats that the body can store or burn for energy. The desired number for triglycerides is lower than 150.  Total cholesterol. This is a measure of the total amount of cholesterol in your blood, including LDL cholesterol, HDL cholesterol, and triglycerides. A healthy number is less than 200. How is this treated? This condition is treated with diet changes, lifestyle changes, and medicines. Diet changes  This may include eating more whole grains, fruits, vegetables, nuts, and fish.  This may also include cutting back on red meat and foods that have a lot of added sugar. Lifestyle changes  Changes may include getting at least 40 minutes of aerobic exercise 3 times a week. Aerobic exercises include walking, biking, and swimming. Aerobic exercise along with a healthy diet can help you maintain a healthy weight.  Changes may also include quitting smoking. Medicines  Medicines are usually given if diet and lifestyle changes have failed to reduce your cholesterol to healthy levels.  Your health care provider may prescribe a statin medicine. Statin medicines have been shown to reduce cholesterol, which can reduce the risk of heart disease. Follow these instructions at home: Eating and drinking If told by your health care provider:  Eat chicken (without skin), fish, veal, shellfish, ground [place] breast, and round or loin cuts of red meat.  Do not eat fried foods or fatty meats, such as hot dogs and salami.  Eat plenty of fruits, such as apples.  Eat plenty of vegetables, such as broccoli, potatoes, and carrots.  Eat  beans, peas, and lentils.  Eat grains such as barley, rice, couscous, and bulgur wheat.  Eat pasta without cream sauces.  Use skim or nonfat milk, and eat low-fat or nonfat yogurt and cheeses.  Do not eat or drink whole milk, cream, ice cream, egg yolks, or hard cheeses.  Do not  eat stick margarine or tub margarines that contain trans fats (also called partially hydrogenated oils).  Do not eat saturated tropical oils, such as coconut oil and palm oil.  Do not eat cakes, cookies, crackers, or other baked goods that contain trans fats.  General instructions  Exercise as directed by your health care provider. Increase your activity level with activities such as gardening, walking, and taking the stairs.  Take over-the-counter and prescription medicines only as told by your health care provider.  Do not use any products that contain nicotine or tobacco, such as cigarettes and e-cigarettes. If you need help quitting, ask your health care provider.  Keep all follow-up visits as told by your health care provider. This is important. Contact a health care provider if:  You are struggling to maintain a healthy diet or weight.  You need help to start on an exercise program.  You need help to stop smoking. Get help right away if:  You have chest pain.  You have trouble breathing. This information is not intended to replace advice given to you by your health care provider. Make sure you discuss any questions you have with your health care provider. Document Revised: 12/15/2017 Document Reviewed: 06/11/2016 Elsevier Patient Education  2020 [employer].

## 2020-01-02 NOTE — Progress Notes (Signed)
Patient ID: Juan Gates                 DOB: 1956/01/12                    MRN: [id number]     HPI: Juan Gates is a 64 y.o. male patient of DR Tresa Endo referred to lipid clinic by A. Duke-PA. PMH is significant for CAD s/p CABG, HDL, HTN, and hx of tobacco abuse.  Patient presents for lipid management and potential PCSK9i initiation. Expressed interest in smoking cessation as well.  Noted LDL was down to 72mg /dL last year but increased back to 101mg /dL. Patient reports significant changes in diet but still compliant with all medication.  Current Medications:  Atorvastatin 80mg  daily  Ezetimibe 10mg  daily Lovaza 1 gram twice daily  Intolerances: none  LDL goal: 70mg /dL  Diet: worsen in the last few months; used to follow low carbohydrate diet but not since COVID started  Family History: The patient's family history includes Cancer in his brother and father; Hypertension in his mother.  Social History: current smoker (1 pack of cigarettes per day); ocassional beer  Labs: 111/06/2019: CHO 177; TG 193; HDL 43; LDL-c 101  (atorvastatin & Omega-3 )  Past Medical History:  Diagnosis Date  . Anxiety   . Arthritis    knees, right hip  . Complication of anesthesia    "started fighting when woke up with tube down throat"  . Coronary artery disease   . Hyperlipidemia   . Hypertension   . Myocardial infarction [facility]) Jan. 6 2014  . S/P CABG x 2 01/03/2013   LIMA to LAD, RIMA to OM1    Current Outpatient Medications on File Prior to Visit  Medication Sig Dispense Refill  . aspirin 81 MG tablet Take 81 mg by mouth daily.    13/06/2019 ezetimibe (ZETIA) 10 MG tablet TAKE 1 TABLET (10 MG TOTAL) BY MOUTH DAILY. 90 tablet 3  . HYDROcodone-acetaminophen (NORCO/VICODIN) 5-325 MG tablet Take 1 tablet by mouth every 6 (six) hours as needed for moderate pain.    . hydrOXYzine (ATARAX/VISTARIL) 25 MG tablet Take 25 mg by mouth at bedtime.  2  . QUEtiapine (SEROQUEL XR) 400 MG 24 hr tablet Take 1  tablet by mouth at bedtime.    [facility] QUEtiapine (SEROQUEL) 25 MG tablet Take 25 mg by mouth at bedtime.    . traZODone (DESYREL) 100 MG tablet Take 1 tablet by mouth at bedtime.     No current facility-administered medications on file prior to visit.    No Known Allergies  Smoker Patient willing to try nicotine patches for smoking cessation. RX for 3 step nicotine patches sent to prefer pharmacy.  Hypercholesterolemia LDL above goal for secondary prevention. Noted betters results last year when patient was compliant with medication, diet and exercise.  Will change his atorvastatin 80mg  to rosuvastatin 40mg  every evening, increase omega-3 to 2gm twice daily, and instructed to decrease simple sugar from diet as much as possible.   Plan to follow up fasting lipid in 3 months and consider PCSK9i therapy if LDL remains above desired goal of 70mg /dL/   Sylis Ketchum Rodriguez-Guzman PharmD, BCPS, CPP [facility] [address] [place] Kitchen 01/06/2020 3:00 PM

## 2020-01-03 ENCOUNTER — Other Ambulatory Visit: Payer: Self-pay | Admitting: Cardiovascular Disease

## 2020-01-06 ENCOUNTER — Encounter: Payer: Self-pay | Admitting: Pharmacist

## 2020-01-06 NOTE — Assessment & Plan Note (Addendum)
LDL above goal for secondary prevention. Noted betters results last year when patient was compliant with medication, diet and exercise.  Will change his atorvastatin 80mg  to rosuvastatin 40mg  every evening, increase omega-3 to 2gm twice daily, and instructed to decrease simple sugar from diet as much as possible.   Plan to follow up fasting lipid in 3 months and consider PCSK9i therapy if LDL remains above desired goal of 70mg /dL.

## 2020-01-06 NOTE — Assessment & Plan Note (Signed)
Patient willing to try nicotine patches for smoking cessation. RX for 3 step nicotine patches sent to prefer pharmacy.

## 2020-02-18 ENCOUNTER — Emergency Department (HOSPITAL_COMMUNITY): Payer: Medicare PPO

## 2020-02-18 ENCOUNTER — Telehealth: Payer: Self-pay | Admitting: Cardiovascular Disease

## 2020-02-18 ENCOUNTER — Emergency Department (HOSPITAL_COMMUNITY)
Admission: EM | Admit: 2020-02-18 | Discharge: 2020-02-18 | Disposition: A | Discharge status: Home / Self Care | Payer: Medicare PPO | Attending: Emergency Medicine | Admitting: Emergency Medicine

## 2020-02-18 ENCOUNTER — Encounter (HOSPITAL_COMMUNITY): Payer: Self-pay | Admitting: *Deleted

## 2020-02-18 ENCOUNTER — Other Ambulatory Visit: Payer: Self-pay

## 2020-02-18 DIAGNOSIS — I252 Old myocardial infarction: Secondary | ICD-10-CM | POA: Insufficient documentation

## 2020-02-18 DIAGNOSIS — Z7982 Long term (current) use of aspirin: Secondary | ICD-10-CM | POA: Insufficient documentation

## 2020-02-18 DIAGNOSIS — Z951 Presence of aortocoronary bypass graft: Secondary | ICD-10-CM | POA: Insufficient documentation

## 2020-02-18 DIAGNOSIS — I10 Essential (primary) hypertension: Secondary | ICD-10-CM | POA: Diagnosis not present

## 2020-02-18 DIAGNOSIS — J189 Pneumonia, unspecified organism: Secondary | ICD-10-CM | POA: Diagnosis not present

## 2020-02-18 DIAGNOSIS — F17210 Nicotine dependence, cigarettes, uncomplicated: Secondary | ICD-10-CM | POA: Diagnosis not present

## 2020-02-18 DIAGNOSIS — Z79899 Other long term (current) drug therapy: Secondary | ICD-10-CM | POA: Insufficient documentation

## 2020-02-18 DIAGNOSIS — R0789 Other chest pain: Secondary | ICD-10-CM

## 2020-02-18 DIAGNOSIS — I2510 Atherosclerotic heart disease of native coronary artery without angina pectoris: Secondary | ICD-10-CM | POA: Insufficient documentation

## 2020-02-18 LAB — CBC
HCT: 45.8 % (ref 39.0–52.0)
Hemoglobin: 15.6 g/dL (ref 13.0–17.0)
MCH: 32.8 pg (ref 26.0–34.0)
MCHC: 34.1 g/dL (ref 30.0–36.0)
MCV: 96.4 fL (ref 80.0–100.0)
Platelets: 186 10*3/uL (ref 150–400)
RBC: 4.75 MIL/uL (ref 4.22–5.81)
RDW: 12.6 % (ref 11.5–15.5)
WBC: 18.3 10*3/uL — ABNORMAL HIGH (ref 4.0–10.5)
nRBC: 0 % (ref 0.0–0.2)

## 2020-02-18 LAB — D-DIMER, QUANTITATIVE: D-Dimer, Quant: 0.71 ug/mL-FEU — ABNORMAL HIGH (ref 0.00–0.50)

## 2020-02-18 LAB — BASIC METABOLIC PANEL
Anion gap: 14 (ref 5–15)
BUN: 16 mg/dL (ref 8–23)
CO2: 23 mmol/L (ref 22–32)
Calcium: 9.6 mg/dL (ref 8.9–10.3)
Chloride: 102 mmol/L (ref 98–111)
Creatinine, Ser: 1 mg/dL (ref 0.61–1.24)
GFR calc Af Amer: >60 mL/min (ref >60)
GFR calc non Af Amer: >60 mL/min (ref >60)
Glucose, Bld: 137 mg/dL — ABNORMAL HIGH (ref 70–99)
Potassium: 4 mmol/L (ref 3.5–5.1)
Sodium: 139 mmol/L (ref 135–145)

## 2020-02-18 LAB — TROPONIN I (HIGH SENSITIVITY)
Troponin I (High Sensitivity): 4 ng/L (ref <18)
Troponin I (High Sensitivity): 4 ng/L (ref <18)

## 2020-02-18 MED ORDER — AZITHROMYCIN 250 MG PO TABS: 250.0000 mg | ORAL_TABLET | Freq: Every day | ORAL | 0 refills | Status: DC

## 2020-02-18 MED ORDER — LORAZEPAM 2 MG/ML IJ SOLN
1.0000 mg | Freq: Once | INTRAMUSCULAR | Status: AC
  Administered 2020-02-18: 1 mg via INTRAVENOUS
  Filled 2020-02-18: qty 1

## 2020-02-18 MED ORDER — AZITHROMYCIN 250 MG PO TABS
500.0000 mg | ORAL_TABLET | Freq: Once | ORAL | Status: AC
  Administered 2020-02-18: 500 mg via ORAL
  Filled 2020-02-18: qty 2

## 2020-02-18 MED ORDER — ALBUTEROL SULFATE HFA 108 (90 BASE) MCG/ACT IN AERS
2.0000 | INHALATION_SPRAY | Freq: Once | RESPIRATORY_TRACT | Status: AC
  Administered 2020-02-18: 2 via RESPIRATORY_TRACT
  Filled 2020-02-18: qty 6.7

## 2020-02-18 MED ORDER — SODIUM CHLORIDE 0.9 % IV SOLN
1.0000 g | Freq: Once | INTRAVENOUS | Status: AC
  Administered 2020-02-18: 1 g via INTRAVENOUS
  Filled 2020-02-18: qty 10

## 2020-02-18 MED ORDER — IOHEXOL 350 MG/ML SOLN
75.0000 mL | Freq: Once | INTRAVENOUS | Status: AC | PRN
  Administered 2020-02-18: 75 mL via INTRAVENOUS

## 2020-02-18 MED ORDER — SODIUM CHLORIDE 0.9 % IV BOLUS
1000.0000 mL | Freq: Once | INTRAVENOUS | Status: AC
  Administered 2020-02-18: 1000 mL via INTRAVENOUS

## 2020-02-18 MED ORDER — CEFDINIR 300 MG PO CAPS: 300.0000 mg | ORAL_CAPSULE | Freq: Two times a day (BID) | ORAL | 0 refills | Status: DC

## 2020-02-18 NOTE — ED Notes (Signed)
Attempted to call Pt's wife with update  At the # confirmed by Pt with no answer. Number Pt confirmed was [phone]. Pt reported wife had her ringer turned off.

## 2020-02-18 NOTE — ED Notes (Signed)
Pt anxious and unable to rest . Ativan ordered x 1 dose.

## 2020-02-18 NOTE — ED Notes (Signed)
  Pt transported to ct 

## 2020-02-18 NOTE — ED Triage Notes (Signed)
Pt reported he started to have Chest pressure at MN and the pressure continued thur out the night . Pt reports some improvement around 0830 this AM . CP worse around 0930 this AM . Pt called  His heart Dr and was instructed to call 911. 911 dispatch instructed pt to take baby ASA.

## 2020-02-18 NOTE — ED Provider Notes (Signed)
[place] EMERGENCY DEPARTMENT Provider Note   CSN: [id number] Arrival date & time:        History Chief Complaint  Patient presents with  . Chest Pain    Juan Gates is a 64 y.o. male.  The history is provided by the patient and medical records. No language interpreter was used.  Chest Pain    64 year old male with significant history of cardiac disease include CABG x2, hyperlipidemia, anxiety presenting to the ED via EMS from home for evaluation of chest pain.  Patient report developing a pressure tightness sensation across his chest last night keeping up from sleep.  Symptom is moderate in severity, lasting for approximately 4 to 5 hours but has since resolved.  He did not report any associated lightheadedness, dizziness, shortness of breath, diaphoresis, nausea, or arm pain or jaw pain during this episode.  Denies any burning sensation.  He did reach out to his cardiology office and spoke with the nurse who encouraged patient to come to the ER for further evaluation.  EMS did give patient 4 baby aspirin but he mentioned his pain has resolved before taking the aspirin.  He denies any recent strenuous activities or heavy lifting.  Does not have any shortness of breath.  No cough fever or chills.  No other complaint.  He has been compliant with his medication.  He admittedly still smoke approximately a pack of cigarettes a day.  No prior history of PE or DVT.  Past Medical History:  Diagnosis Date  . Anxiety   . Arthritis    knees, right hip  . Complication of anesthesia    "started fighting when woke up with tube down throat"  . Coronary artery disease   . Hyperlipidemia   . Hypertension   . Myocardial infarction [facility]) Jan. 6 2014  . S/P CABG x 2 01/03/2013   LIMA to LAD, RIMA to OM1    Patient Active Problem List   Diagnosis Date Noted  . Hyperlipidemia LDL goal <70 11/13/2014  . CAD (coronary artery disease) 11/13/2014  . Musculoskeletal neck pain  05/16/2013  . Abnormal resting ECG findings - diffuse mild STE 05/16/2013  . Shoulder pain - likley muscle strain 05/16/2013  . S/P CABG x 2, (LIMA-LAD and RIMA-OM) 01/03/13 01/03/2013  . Hypercholesterolemia 01/02/2013  . NSTEMI- S/P cath 1/7- severe 2 vessel dz; EF 55% 12/31/2012  . Smoker 12/31/2012    Past Surgical History:  Procedure Laterality Date  . CARDIAC CATHETERIZATION  12/31/2012   2 vessel cors 90%hazy stenosis prox circ with long segment 60-70%LAD,mod RCA,severe lesion small marginal branch  . CORONARY ARTERY BYPASS GRAFT  01/03/2013   Procedure: CORONARY ARTERY BYPASS GRAFTING (CABG);  Surgeon: Rexene Alberts, MD;  Location: [place];  Service: Open Heart Surgery;  Laterality: N/A;  . HERNIA REPAIR  07/22/13   open RIH  . INGUINAL HERNIA REPAIR Left 07/22/2013   Procedure: OPEN LEFT INGUINAL HERNIA REPAIR ;  Surgeon: Edward Jolly, MD;  Location: [facility];  Service: General;  Laterality: Left;  . INSERTION OF MESH Left 07/22/2013   Procedure: INSERTION OF MESH;  Surgeon: Edward Jolly, MD;  Location: [facility];  Service: General;  Laterality: Left;  . INTRAOPERATIVE TRANSESOPHAGEAL ECHOCARDIOGRAM  01/03/2013   Procedure: INTRAOPERATIVE TRANSESOPHAGEAL ECHOCARDIOGRAM;  Surgeon: Rexene Alberts, MD;  Location: [place];  Service: Open Heart Surgery;  Laterality: N/A;  . LEFT HEART CATHETERIZATION WITH CORONARY ANGIOGRAM N/A 01/01/2013   Procedure: LEFT HEART CATHETERIZATION WITH  CORONARY ANGIOGRAM;  Surgeon: [name], MD;  Location: [facility] CATH LAB;  Service: Cardiovascular;  Laterality: N/A;       Family History  Problem Relation Age of Onset  . Hypertension Mother   . Cancer Father        bone cancer  . Cancer Brother        metastatic melanoma    Social History   Tobacco Use  . Smoking status: Current Every Day Smoker    Packs/day: 1.00    Years: 20.00    Pack years: 20.00    Types: Cigarettes  . Smokeless tobacco: Never Used  Substance Use Topics  . Alcohol  use: Yes    Comment: beers "every now and then"  . Drug use: No    Home Medications Prior to Admission medications   Medication Sig Start Date End Date Taking? Authorizing Provider  aspirin 81 MG tablet Take 81 mg by mouth daily.    [provider]  ezetimibe (ZETIA) 10 MG tablet TAKE 1 TABLET (10 MG TOTAL) BY MOUTH DAILY. 01/09/19 01/01/21  Lennette Bihari, MD  HYDROcodone-acetaminophen (NORCO/VICODIN) 5-325 MG tablet Take 1 tablet by mouth every 6 (six) hours as needed for moderate pain.    [provider]  hydrOXYzine (ATARAX/VISTARIL) 25 MG tablet Take 25 mg by mouth at bedtime. 04/27/18   [provider]  lisinopril (ZESTRIL) 5 MG tablet TAKE 1 TABLET (5 MG TOTAL) BY MOUTH DAILY. 01/06/20   Lennette Bihari, MD  metoprolol tartrate (LOPRESSOR) 50 MG tablet TAKE 1 AND 1/2 TABLETS (75 MG TOTAL)  TWO TIMES DAILY 01/06/20   Lennette Bihari, MD  nicotine (NICODERM CQ - DOSED IN MG/24 HOURS) 14 mg/24hr patch Place 1 patch (14 mg total) onto the skin daily. START after 21mg  therapy completed 01/02/20   03/01/20, MD  nicotine (NICODERM CQ - DOSED IN MG/24 HOURS) 21 mg/24hr patch Place 1 patch (21 mg total) onto the skin daily. START 14mg  after 21mg  patches completed. 01/02/20   , MD  nicotine (NICODERM CQ - DOSED IN MG/24 HR) 7 mg/24hr patch Place 1 patch (7 mg total) onto the skin daily. STEP 3. START after 14 mg patches completed. 01/02/20   03/01/20, MD  omega-3 acid ethyl esters (LOVAZA) 1 g capsule Take 2 capsules (2 g total) by mouth 2 (two) times daily. 01/02/20   03/01/20, MD  QUEtiapine (SEROQUEL XR) 400 MG 24 hr tablet Take 1 tablet by mouth at bedtime. 11/15/16   [provider]  QUEtiapine (SEROQUEL) 25 MG tablet Take 25 mg by mouth at bedtime.    [provider]  rosuvastatin (CRESTOR) 40 MG tablet Take 1 tablet (40 mg total) by mouth daily. TO REPLACE ATORVASTATIN 80MG  01/02/20   Lennette Bihari, MD  traZODone (DESYREL)  100 MG tablet Take 1 tablet by mouth at bedtime. 09/09/16   [provider]    Allergies    Patient has no known allergies.  Review of Systems   Review of Systems  Cardiovascular: Positive for chest pain.  All other systems reviewed and are negative.   Physical Exam Updated Vital Signs BP (!) 149/79 (BP Location: Right Arm)   Pulse (!) 104   Temp 98.4 F (36.9 C) (Oral)   Resp (!) 22   Ht 6' 1.5" (1.867 m)   Wt 100.2 kg   SpO2 100%   BMI 28.76 kg/m   Physical Exam Vitals and nursing  note reviewed.  Constitutional:      General: He is not in acute distress.    Appearance: He is well-developed.  HENT:     Head: Atraumatic.  Eyes:     Conjunctiva/sclera: Conjunctivae normal.  Cardiovascular:     Rate and Rhythm: Tachycardia present.     Heart sounds: Normal heart sounds.  Pulmonary:     Effort: Pulmonary effort is normal.     Breath sounds: Normal breath sounds.  Chest:     Chest wall: No tenderness.  Abdominal:     Palpations: Abdomen is soft.  Musculoskeletal:     Cervical back: Neck supple.     Right lower leg: No edema.     Left lower leg: No edema.  Skin:    Capillary Refill: Capillary refill takes less than 2 seconds.     Findings: No rash.  Neurological:     Mental Status: He is alert and oriented to person, place, and time.     ED Results / Procedures / Treatments   Labs (all labs ordered are listed, but only abnormal results are displayed) Labs Reviewed  BASIC METABOLIC PANEL - Abnormal; Notable for the following components:      Result Value   Glucose, Bld 137 (*)    All other components within normal limits  CBC - Abnormal; Notable for the following components:   WBC 18.3 (*)    All other components within normal limits  D-DIMER, QUANTITATIVE (NOT AT [facility]) - Abnormal; Notable for the following components:   D-Dimer, Quant 0.71 (*)    All other components within normal limits  TROPONIN I (HIGH SENSITIVITY)  TROPONIN I (HIGH  SENSITIVITY)    EKG EKG Interpretation  Date/Time:  Tuesday February 18 2020 11:39:13 EST Ventricular Rate:  109 PR Interval:    QRS Duration: 97 QT Interval:  335 QTC Calculation: 452 R Axis:   -33 Text Interpretation: Sinus tachycardia RSR' in V1 or V2, probably normal variant Inferior infarct, old No significant change since last tracing Confirmed by Gwyneth Sprout ([id number]) on 02/18/2020 11:46:56 AM   Radiology DG Chest Port 1 View  Result Date: 02/18/2020 CLINICAL DATA:  Chest pain EXAM: PORTABLE CHEST 1 VIEW COMPARISON:  None. FINDINGS: Low lung volumes. No consolidation. No pleural effusion or pneumothorax. Heart size is normal for portable technique. IMPRESSION: No acute process in the chest. Electronically Signed   By: Guadlupe Spanish M.D.   On: 02/18/2020 12:52    Procedures Procedures (including critical care time)  Medications Ordered in ED Medications  LORazepam (ATIVAN) injection 1 mg (has no administration in time range)  sodium chloride 0.9 % bolus 1,000 mL (has no administration in time range)  LORazepam (ATIVAN) injection 1 mg (1 mg Intravenous Given 02/18/20 1548)    ED Course  I have reviewed the triage vital signs and the nursing notes.  Pertinent labs & imaging results that were available during my care of the patient were reviewed by me and considered in my medical decision making (see chart for details).    MDM Rules/Calculators/A&P                      BP 109/88   Pulse (!) 124   Temp 98.4 F (36.9 C) (Oral)   Resp (!) 22   Ht 6' 1.5" (1.867 m)   Wt 100.2 kg   SpO2 95%   BMI 28.76 kg/m   Final Clinical Impression(s) / ED Diagnoses Final diagnoses:  Atypical  chest pain    Rx / DC Orders ED Discharge Orders    None     11:53 AM Patient complaining of tightness and pressure sensation across his chest this morning lasting for several hours but has since resolved without any specific treatment.  No aggravating or alleviating factors  were reported.  He is currently resting comfortably however he is tachycardic on the monitor.  Patient does not report any shortness of breath and no prior history of PE or DVT.  No pleuritic chest pain.  Low suspicion for PE based on Wells criteria.  Work-up initiated, plan to obtain delta troponin.  The cardiac monitor revealed sinus tachycardia as interpreted by me. The cardiac monitor was ordered secondary to the patient's history of CAD and to monitor the patient for dysrhythmia  2:52 PM EKG without concerning changes, initial troponin is normal, chest x-ray unremarkable, labs remarkable for an elevated white count of 18.3 that is very nonspecific as patient does not have any infectious symptoms.  Electrolyte panels are reassuring.  His heart pathway score is 3, low risk of Mace.  3:16 PM Patient ambulate without hypoxia.  Maintaining 97%'s oxygenation on room air.  Negative delta troponin.  Still chest pain-free.  Patient however still remains tachycardic, he appears to be very anxious.  He is frustrated that he has been in the ER for 8 hours and wanting to go home.  His tachycardia could be related to his underlying anxiety however in the setting of chest discomfort, leukocytosis, and tachycardia, will obtain D-dimer to rule out PE.  4:35 PM Elevated d-dimer of 0.71.  Will obtain chest CTA to r/o PE.  Heart rate seems to increase when patient is agitated.  He prefers to go home but is amenable for CT angiogram.  Anticipate if chest CT shows no evidence of acute finding or PE, he can follow-up with his PCP and cardiologist for further care.  8:59 PM CT scan is currently pending.  Pt sign out to Dr. Madilyn Hook who will f/u on CT result.     Fayrene Helper, PA-C 02/18/20 2102    Gwyneth Sprout, MD 02/19/20 [phone]

## 2020-02-18 NOTE — Telephone Encounter (Signed)
Spoke with patient. Patient reporting chest pain and pressure that came on last night and has continued into today. No other cardiac symptoms. BP is 125/95. Patient does not have nitroglycerin at home. Patient reports it feels as though someone is sitting on his chest.   Patient advised to hang up and dial 911 for evaluation. Patient is on the schedule for [facility] tomorrow morning at 9:15am if not admitted to the hospital.

## 2020-02-18 NOTE — ED Notes (Signed)
Family In room and came to desk to report Pt can not hear. Family wants a refill on ice water for PT.

## 2020-02-18 NOTE — Telephone Encounter (Signed)
New Message   Pt c/o of Chest Pain: STAT if CP now or developed within 24 hours  1. Are you having CP right now? Yes, low lingering pain  2. Are you experiencing any other symptoms (ex. SOB, nausea, vomiting, sweating)? No  3. How long have you been experiencing CP? Last night, kept him awakep all night and this morning   4. Is your CP continuous or coming and going? continuous  5. Have you taken Nitroglycerin? No, doesn't have one  BP right now 125/95

## 2020-02-18 NOTE — Discharge Instructions (Addendum)
You have been evaluated for your chest discomfort.  Your white count is elevated, please have it recheck by your primary care provider in the next few days.  Your chest CT scan showed some changes that can be found pneumonia.  Your heart enzymes are normal today.  Please also follow up with your cardiologist for further care.

## 2020-02-18 NOTE — ED Notes (Signed)
This Clinical research associate sent a secure text to [place]  Requesting him to update Pt's wife who in the front lobby.

## 2020-02-19 ENCOUNTER — Encounter: Payer: Medicare PPO | Admitting: Physician Assistant

## 2020-02-19 NOTE — Progress Notes (Signed)
This encounter was created in error - please disregard.

## 2020-02-25 ENCOUNTER — Other Ambulatory Visit: Payer: Self-pay

## 2020-02-25 MED ORDER — EZETIMIBE 10 MG PO TABS: 10.0000 mg | ORAL_TABLET | Freq: Every day | ORAL | 3 refills | Status: DC

## 2020-03-03 ENCOUNTER — Other Ambulatory Visit: Payer: Self-pay

## 2020-03-09 ENCOUNTER — Telehealth: Payer: Self-pay

## 2020-03-09 DIAGNOSIS — E7800 Pure hypercholesterolemia, unspecified: Secondary | ICD-10-CM

## 2020-03-09 NOTE — Telephone Encounter (Signed)
Called and spoke w/pt stated that lipid lans need to be completed and the pt voiced understanding and stated that he will have them done within the week

## 2020-03-09 NOTE — Telephone Encounter (Signed)
-----   Message from [place], [facility] sent at 03/09/2020  7:43 AM EDT ----- Regarding: Lipids Please call patient. Due to repeat fasting lipid if still taking rosuvastatin and fish oil.  Thanks ----- Message ----- From: Pearletha Furl, RPH Sent: 03/09/2020 To: Meriel Pica Rodriguez-Guzman, RPH  Repeat fasting blood work (LDL & TG)

## 2020-03-16 ENCOUNTER — Encounter: Payer: Self-pay | Admitting: Physician Assistant

## 2020-03-24 LAB — LIPID PANEL
Chol/HDL Ratio: 3.5 ratio (ref 0.0–5.0)
Cholesterol, Total: 136 mg/dL (ref 100–199)
HDL: 39 mg/dL — ABNORMAL LOW (ref >39)
LDL Chol Calc (NIH): 68 mg/dL (ref 0–99)
Triglycerides: 173 mg/dL — ABNORMAL HIGH (ref 0–149)
VLDL Cholesterol Cal: 29 mg/dL (ref 5–40)

## 2020-03-24 LAB — HEPATIC FUNCTION PANEL
ALT: 100 IU/L — ABNORMAL HIGH (ref 0–44)
AST: 61 IU/L — ABNORMAL HIGH (ref 0–40)
Albumin: 4.7 g/dL (ref 3.8–4.8)
Alkaline Phosphatase: 85 IU/L (ref 39–117)
Bilirubin Total: 0.5 mg/dL (ref 0.0–1.2)
Bilirubin, Direct: 0.17 mg/dL (ref 0.00–0.40)
Total Protein: 7.3 g/dL (ref 6.0–8.5)

## 2020-03-24 LAB — COMPREHENSIVE METABOLIC PANEL
ALT: 96 IU/L — ABNORMAL HIGH (ref 0–44)
AST: 62 IU/L — ABNORMAL HIGH (ref 0–40)
Albumin/Globulin Ratio: 1.8 (ref 1.2–2.2)
Albumin: 4.6 g/dL (ref 3.8–4.8)
Alkaline Phosphatase: 82 IU/L (ref 39–117)
BUN/Creatinine Ratio: 18 (ref 10–24)
BUN: 18 mg/dL (ref 8–27)
Bilirubin Total: 0.5 mg/dL (ref 0.0–1.2)
CO2: 19 mmol/L — ABNORMAL LOW (ref 20–29)
Calcium: 9.3 mg/dL (ref 8.6–10.2)
Chloride: 103 mmol/L (ref 96–106)
Creatinine, Ser: 0.99 mg/dL (ref 0.76–1.27)
GFR calc Af Amer: 94 mL/min/{1.73_m2} (ref >59)
GFR calc non Af Amer: 81 mL/min/{1.73_m2} (ref >59)
Globulin, Total: 2.5 g/dL (ref 1.5–4.5)
Glucose: 91 mg/dL (ref 65–99)
Potassium: 4.6 mmol/L (ref 3.5–5.2)
Sodium: 138 mmol/L (ref 134–144)
Total Protein: 7.1 g/dL (ref 6.0–8.5)

## 2020-03-27 ENCOUNTER — Other Ambulatory Visit: Payer: Self-pay

## 2020-03-27 DIAGNOSIS — Z79899 Other long term (current) drug therapy: Secondary | ICD-10-CM

## 2020-03-31 ENCOUNTER — Telehealth: Payer: Self-pay

## 2020-03-31 NOTE — Telephone Encounter (Signed)
Discussed with Dr.Kelly, okay to continue with Juan Gates's recommendations and orders. No need to call pt to change this.

## 2020-03-31 NOTE — Telephone Encounter (Signed)
Follow up ° ° °Patient is returning call. Please call. °

## 2020-03-31 NOTE — Telephone Encounter (Signed)
LVM2CB to discuss labs from 3/30

## 2020-03-31 NOTE — Telephone Encounter (Signed)
Called and spoke with pt. Clarified that another nurse had already reviewed the results with him from [place]. He states he is aware to hold his statin for 2 weeks and have follow up labs drawn at his office visit. No other questions at this time.

## 2020-04-14 ENCOUNTER — Ambulatory Visit (INDEPENDENT_AMBULATORY_CARE_PROVIDER_SITE_OTHER): Payer: Medicare PPO | Admitting: Physician Assistant

## 2020-04-14 ENCOUNTER — Other Ambulatory Visit: Payer: Self-pay

## 2020-04-14 ENCOUNTER — Encounter: Payer: Self-pay | Admitting: Physician Assistant

## 2020-04-14 VITALS — BP 130/80 | HR 57 | Ht 73.5 in | Wt 229.0 lb

## 2020-04-14 DIAGNOSIS — I10 Essential (primary) hypertension: Secondary | ICD-10-CM | POA: Diagnosis not present

## 2020-04-14 DIAGNOSIS — I25810 Atherosclerosis of coronary artery bypass graft(s) without angina pectoris: Secondary | ICD-10-CM

## 2020-04-14 DIAGNOSIS — Z79899 Other long term (current) drug therapy: Secondary | ICD-10-CM

## 2020-04-14 DIAGNOSIS — E785 Hyperlipidemia, unspecified: Secondary | ICD-10-CM

## 2020-04-14 LAB — HEPATIC FUNCTION PANEL
ALT: 24 IU/L (ref 0–44)
AST: 22 IU/L (ref 0–40)
Albumin: 4.4 g/dL (ref 3.8–4.8)
Alkaline Phosphatase: 77 IU/L (ref 39–117)
Bilirubin Total: 0.4 mg/dL (ref 0.0–1.2)
Bilirubin, Direct: 0.11 mg/dL (ref 0.00–0.40)
Total Protein: 6.9 g/dL (ref 6.0–8.5)

## 2020-04-14 NOTE — Progress Notes (Signed)
Cardiology Office Note:    Date:  04/16/2020   ID:  [name], DOB 1956/07/15, MRN [id number]  PCP:  Fleet Contras, MD  Cardiologist:  Nicki Guadalajara, MD  Electrophysiologist:  None   Referring MD: Fleet Contras, MD   Chief Complaint  Patient presents with  . Follow-up  . Chest Pain    History of Present Illness:    Eriq Hufford is a 64 y.o. male with a hx of CAD s/p CABG, HTN, tobacco abuse and HLD.  He had a NSTEMI in January 2014 and underwent CABG x2 by Dr. Cornelius Moras with RIMA to OM, LIMA to LAD.  EF was 55-60% by echo at the time. He has been on disability since CABG in January 2014.  Due to the uncontrolled cholesterol, fenofibrate was added to Lipitor and omega-3 fatty acid.  I last saw the patient in March 2020 at which time he continued to smoke.  He was last seen via virtual visit by Micah Flesher at which time he was doing well however cholesterol was uncontrolled.  Since then, he was seen in the ED for atypical chest pain in February 2021.  Troponin was negative.  CT angiogram of the chest was negative for PE, bilateral bibasilar groundglass opacities and streaky airspace opacities likely atelectasis.  Repeat blood work in March 2021 does show elevated transaminases, patient was instructed to hold statin medication for 2 weeks before resume the previous dose.  Patient presents today for cardiology office visit.  He just had a blood work this morning to check you for his cholesterol and liver function.  He has not been taking his Crestor.  Fortunately since his last ED visit in February, he has not had any further chest discomfort.  He apparently was diagnosed with pneumonia at the time and he had no further chest discomfort after antibiotic.  He has been able to care for himself at home without any issue.  He denies any lower extremity edema, orthopnea or PND.  He does have significant seasonal allergies.  Otherwise he has been doing well from cardiology perspective and can  follow-up in 6 months.  Once this morning's labs come back I will need to determine if he should rechallenge himself with Crestor versus consider PCSK9 inhibitor.  Past Medical History:  Diagnosis Date  . Anxiety   . Arthritis    knees, right hip  . Complication of anesthesia    "started fighting when woke up with tube down throat"  . Coronary artery disease   . Hyperlipidemia   . Hypertension   . Myocardial infarction [facility]) Jan. 6 2014  . 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 CARDIAC CATHETERIZATION  12/31/2012   2 vessel cors 90%hazy stenosis prox circ with long segment 60-70%LAD,mod RCA,severe lesion small marginal branch  . CORONARY ARTERY BYPASS GRAFT  01/03/2013   Procedure: CORONARY ARTERY BYPASS GRAFTING (CABG);  Surgeon: Purcell Nails, MD;  Location: [facility] OR;  Service: Open Heart Surgery;  Laterality: N/A;  . HERNIA REPAIR  07/22/13   open RIH  . INGUINAL HERNIA REPAIR Left 07/22/2013   Procedure: OPEN LEFT INGUINAL HERNIA REPAIR ;  Surgeon: Mariella Saa, MD;  Location: [facility] ORS;  Service: General;  Laterality: Left;  . INSERTION OF MESH Left 07/22/2013   Procedure: INSERTION OF MESH;  Surgeon: Mariella Saa, MD;  Location: [facility] ORS;  Service: General;  Laterality: Left;  .  INTRAOPERATIVE TRANSESOPHAGEAL ECHOCARDIOGRAM  01/03/2013   Procedure: INTRAOPERATIVE TRANSESOPHAGEAL ECHOCARDIOGRAM;  Surgeon: Purcell Nails, MD;  Location: [facility] OR;  Service: Open Heart Surgery;  Laterality: N/A;  . LEFT HEART CATHETERIZATION WITH CORONARY ANGIOGRAM N/A 01/01/2013   Procedure: LEFT HEART CATHETERIZATION WITH CORONARY ANGIOGRAM;  Surgeon: [name], MD;  Location[facility];  Service: Cardiovascular;  Laterality: N/A;    Current Medications: Current Meds  Medication Sig  . aspirin 81 MG tablet Take 81 mg by mouth daily.  Marland Kitchen ezetimibe (ZETIA) 10 MG tablet Take 1 tablet (10 mg total) by mouth daily.  Marland Kitchen  HYDROcodone-acetaminophen (NORCO/VICODIN) 5-325 MG tablet Take 1 tablet by mouth every 6 (six) hours as needed for moderate pain.  . hydrOXYzine (ATARAX/VISTARIL) 25 MG tablet Take 25 mg by mouth at bedtime.  Marland Kitchen lisinopril (ZESTRIL) 5 MG tablet TAKE 1 TABLET (5 MG TOTAL) BY MOUTH DAILY.  . metoprolol tartrate (LOPRESSOR) 50 MG tablet TAKE 1 AND 1/2 TABLETS (75 MG TOTAL)  TWO TIMES DAILY  . naproxen (NAPROSYN) 500 MG tablet Take 500 mg by mouth daily as needed for mild pain.   Marland Kitchen omega-3 acid ethyl esters (LOVAZA) 1 g capsule Take 2 capsules (2 g total) by mouth 2 (two) times daily. (Patient taking differently: Take 2-3 g by mouth See admin instructions. Patient takes 1 tablet in the morning and 2 tablets at bedtime)  . OVER THE COUNTER MEDICATION Apply 1 application topically daily as needed (For sores or pain).  . QUEtiapine (SEROQUEL XR) 400 MG 24 hr tablet Take 1 tablet by mouth at bedtime.  . traZODone (DESYREL) 100 MG tablet Take 1 tablet by mouth at bedtime.  . [DISCONTINUED] azithromycin (ZITHROMAX) 250 MG tablet Take 1 tablet (250 mg total) by mouth daily. Take first 2 tablets together, then 1 every day until finished.  . [DISCONTINUED] cefdinir (OMNICEF) 300 MG capsule Take 1 capsule (300 mg total) by mouth 2 (two) times daily.  . [DISCONTINUED] nicotine (NICODERM CQ - DOSED IN MG/24 HOURS) 14 mg/24hr patch Place 1 patch (14 mg total) onto the skin daily. START after 21mg  therapy completed  . [DISCONTINUED] nicotine (NICODERM CQ - DOSED IN MG/24 HOURS) 21 mg/24hr patch Place 1 patch (21 mg total) onto the skin daily. START 14mg  after 21mg  patches completed.  . [DISCONTINUED] nicotine (NICODERM CQ - DOSED IN MG/24 HR) 7 mg/24hr patch Place 1 patch (7 mg total) onto the skin daily. STEP 3. START after 14 mg patches completed.  . [DISCONTINUED] QUEtiapine (SEROQUEL) 25 MG tablet Take 25 mg by mouth at bedtime.     Allergies:   Patient has no known allergies.   Social History   Socioeconomic  History  . Marital status: Married    Spouse name: Not on file  . Number of children: Not on file  . Years of education: Not on file  . Highest education level: Not on file  Occupational History  . Not on file  Tobacco Use  . Smoking status: Current Every Day Smoker    Packs/day: 1.00    Years: 20.00    Pack years: 20.00    Types: Cigarettes  . Smokeless tobacco: Never Used  Substance and Sexual Activity  . Alcohol use: Yes    Comment: beers "every now and then"  . Drug use: No  . Sexual activity: Not on file  Other Topics Concern  . Not on file  Social History Narrative  . Not on file   Social Determinants of Health  Financial Resource Strain:   . Difficulty of Paying Living Expenses:   Food Insecurity:   . Worried About Charity fundraiser in the Last Year:   . Arboriculturist in the Last Year:   Transportation Needs:   . Film/video editor (Medical):   Marland Kitchen Lack of Transportation (Non-Medical):   Physical Activity:   . Days of Exercise per Week:   . Minutes of Exercise per Session:   Stress:   . Feeling of Stress :   Social Connections:   . Frequency of Communication with Friends and Family:   . Frequency of Social Gatherings with Friends and Family:   . Attends Religious Services:   . Active Member of Clubs or Organizations:   . Attends Archivist Meetings:   Marland Kitchen Marital Status:      Family History: The patient's family history includes Cancer in his brother and father; Hypertension in his mother.  ROS:   Please see the history of present illness.     All other systems reviewed and are negative.  EKGs/Labs/Other Studies Reviewed:    The following studies were reviewed today:  Echo 01/02/2013 LV EF: 55% -  60%   ------------------------------------------------------------  History:  PMH: NSTEMI PMH:  Myocardial infarction. Risk  factors: Current tobacco use. Dyslipidemia.    ------------------------------------------------------------  Study Conclusions   - Left ventricle: The cavity size was normal. Wall thickness  was normal. Systolic function was normal. The estimated  ejection fraction was in the range of 55% to 60%. Mild  hypokinesis of the mid-distallateral myocardium. Left  ventricular diastolic function parameters were normal.  - Left atrium: The atrium was mildly dilated.    EKG:  EKG is ordered today.  The ekg ordered today demonstrates normal sinus rhythm without significant ST-T wave changes, poor R wave progression in the anterior leads.  Recent Labs: 02/18/2020: Hemoglobin 15.6; Platelets 186 03/24/2020: BUN 18; Creatinine, Ser 0.99; Potassium 4.6; Sodium 138 04/14/2020: ALT 24  Recent Lipid Panel    Component Value Date/Time   CHOL 136 03/24/2020 1059   TRIG 173 (H) 03/24/2020 1059   HDL 39 (L) 03/24/2020 1059   CHOLHDL 3.5 03/24/2020 1059   CHOLHDL 4.8 12/16/2016 0924   VLDL 35 (H) 12/16/2016 0924   LDLCALC 68 03/24/2020 1059    Physical Exam:    VS:  BP 130/80 (BP Location: Left Arm, Patient Position: Sitting, Cuff Size: Normal)   Pulse (!) 57   Ht 6' 1.5" (1.867 m)   Wt 229 lb (103.9 kg)   BMI 29.80 kg/m     Wt Readings from Last 3 Encounters:  04/14/20 229 lb (103.9 kg)  02/18/20 221 lb (100.2 kg)  01/02/20 225 lb (102.1 kg)     GEN:  Well nourished, well developed in no acute distress HEENT: Normal NECK: No JVD; No carotid bruits LYMPHATICS: No lymphadenopathy CARDIAC: RRR, no murmurs, rubs, gallops RESPIRATORY:  Clear to auscultation without rales, wheezing or rhonchi  ABDOMEN: Soft, non-tender, non-distended MUSCULOSKELETAL:  No edema; No deformity  SKIN: Warm and dry NEUROLOGIC:  Alert and oriented x 3 PSYCHIATRIC:  Normal affect   ASSESSMENT:    1. Coronary artery disease involving coronary bypass graft of native heart without angina pectoris   2. Essential hypertension   3. Hyperlipidemia LDL  goal <70    PLAN:    In order of problems listed above:  1. CAD s/p CABG: Patient did went to the emergency room in February, however chest pain  has since resolved.  He says he was treated with pneumonia, after he finished with antibiotic, symptom immediately got better.  In the past 43-month, he has been able to exert himself without any chest discomfort.  At this point, he does not need any additional work-up.  Continue aspirin and beta-blocker.  2. Hypertension: Blood pressure stable  3. Hyperlipidemia: He is currently statin is on hold due to elevated liver enzyme.  He is due for repeat liver test today.  Once that test come back, we will need to consider rechallenging him with statin or referred to the lipid clinic.   Medication Adjustments/Labs and Tests Ordered: Current medicines are reviewed at length with the patient today.  Concerns regarding medicines are outlined above.  Orders Placed This Encounter  Procedures  . EKG 12-Lead   No orders of the defined types were placed in this encounter.   Patient Instructions  Medication Instructions:  Your physician recommends that you continue on your current medications as directed. Please refer to the Current Medication list given to you today.  *If you need a refill on your cardiac medications before your next appointment, please call your pharmacy*  Lab Work: NONE ordered at this time of appointment   If you have labs (blood work) drawn today and your tests are completely normal, you will receive your results only by: Marland Kitchen MyChart Message (if you have MyChart) OR . A paper copy in the mail If you have any lab test that is abnormal or we need to change your treatment, we will call you to review the results.  Testing/Procedures: NONE ordered at this time of appointment   Follow-Up: At [facility], you and your health needs are our priority.  As part of our continuing mission to provide you with exceptional heart care, we have  created designated Provider Care Teams.  These Care Teams include your primary Cardiologist (physician) and Advanced Practice Providers (APPs -  Physician Assistants and Nurse Practitioners) who all work together to provide you with the care you need, when you need it.   Your next appointment:   6 month(s)  The format for your next appointment:   In Person  Provider:   Nicki Guadalajara, MD  Other Instructions      Signed, Azalee Course, PA  04/16/2020 11:12 PM    [facility]

## 2020-04-14 NOTE — Patient Instructions (Signed)
Medication Instructions:  °Your physician recommends that you continue on your current medications as directed. Please refer to the Current Medication list given to you today. ° °*If you need a refill on your cardiac medications before your next appointment, please call your pharmacy* ° °Lab Work: °NONE ordered at this time of appointment  ° °If you have labs (blood work) drawn today and your tests are completely normal, you will receive your results only by: °MyChart Message (if you have MyChart) OR °A paper copy in the mail °If you have any lab test that is abnormal or we need to change your treatment, we will call you to review the results. ° °Testing/Procedures: °NONE ordered at this time of appointment  ° °Follow-Up: °At [facility], you and your health needs are our priority.  As part of our continuing mission to provide you with exceptional heart care, we have created designated Provider Care Teams.  These Care Teams include your primary Cardiologist (physician) and Advanced Practice Providers (APPs -  Physician Assistants and Nurse Practitioners) who all work together to provide you with the care you need, when you need it. ° °Your next appointment:   °6 month(s) ° °The format for your next appointment:   °In Person ° °Provider:   °Thomas Kelly, MD   ° °Other Instructions ° ° °

## 2020-04-15 ENCOUNTER — Other Ambulatory Visit: Payer: Self-pay | Admitting: *Deleted

## 2020-04-15 DIAGNOSIS — E7800 Pure hypercholesterolemia, unspecified: Secondary | ICD-10-CM

## 2020-04-28 ENCOUNTER — Ambulatory Visit: Payer: Medicare PPO

## 2020-05-13 ENCOUNTER — Ambulatory Visit (INDEPENDENT_AMBULATORY_CARE_PROVIDER_SITE_OTHER): Payer: Medicare PPO | Admitting: Pharmacist

## 2020-05-13 ENCOUNTER — Encounter: Payer: Self-pay | Admitting: Pharmacist

## 2020-05-13 ENCOUNTER — Other Ambulatory Visit: Payer: Self-pay

## 2020-05-13 VITALS — BP 116/80 | HR 69 | Ht 73.0 in | Wt 223.0 lb

## 2020-05-13 DIAGNOSIS — I25810 Atherosclerosis of coronary artery bypass graft(s) without angina pectoris: Secondary | ICD-10-CM | POA: Diagnosis not present

## 2020-05-13 DIAGNOSIS — E7800 Pure hypercholesterolemia, unspecified: Secondary | ICD-10-CM | POA: Diagnosis not present

## 2020-05-13 NOTE — Progress Notes (Signed)
Patient ID: [name]                 DOB: [birth year]                    MRN: [id number]     HPI: Juan Gates is a 64 y.o. male patient referred to lipid clinic by Fabian Sharp. PMH is significant for NSTEMI, CAD, HLD, CABG x2.  Rosuvastatin was previously at goal LDL level on rosuvastatin and Zetia however liver enzymes elevated.  Discontinued rosuvastatin and LFTs decreased to normal.    Patient presents today in good spirits and motivated to lower cholesterol and quit smoking.  Smokes 1 ppd.  Reports was sent in a prescription for nicotine patches but could not afford.  Is trying to work on diet and has cut down on processed meats and is trying to eat more fruits and vegetables.  Used to drink many sodas a day and now mostly drinks water.  Reports occasional alcohol use.  Is not able to get much exercise but reports he tries to be as active as he can.    Current Medications: Zetia 10 mg daily, Lovaza 1 gram in morning, 2 grams at night.  Intolerances: Discontinued rosuvastatin due to elevated liver enzymes Risk Factors: CAD, smoking, previous MI LDL goal: <70  Diet: concentrating on fruits and vegetables  Social History: Current everyday 1 PPD smoker  Labs: Trigs 173, LDL 68, TC 136, HDL 39 (03/24/20) while on Zetia and Rosuvastatin  Past Medical History:  Diagnosis Date  . Anxiety   . Arthritis    knees, right hip  . Complication of anesthesia    "started fighting when woke up with tube down throat"  . Coronary artery disease   . Hyperlipidemia   . Hypertension   . Myocardial infarction [facility]) Jan. 6 2014  . S/P CABG x 2 01/03/2013   LIMA to LAD, RIMA to OM1    Current Outpatient Medications on File Prior to Visit  Medication Sig Dispense Refill  . aspirin 81 MG tablet Take 81 mg by mouth daily.    Marland Kitchen ezetimibe (ZETIA) 10 MG tablet Take 1 tablet (10 mg total) by mouth daily. 90 tablet 3  . HYDROcodone-acetaminophen (NORCO/VICODIN) 5-325 MG tablet Take 1 tablet by mouth  every 6 (six) hours as needed for moderate pain.    . hydrOXYzine (ATARAX/VISTARIL) 25 MG tablet Take 25 mg by mouth at bedtime.  2  . lisinopril (ZESTRIL) 5 MG tablet TAKE 1 TABLET (5 MG TOTAL) BY MOUTH DAILY. 90 tablet 2  . metoprolol tartrate (LOPRESSOR) 50 MG tablet TAKE 1 AND 1/2 TABLETS (75 MG TOTAL)  TWO TIMES DAILY 270 tablet 2  . naproxen (NAPROSYN) 500 MG tablet Take 500 mg by mouth daily as needed for mild pain.     Marland Kitchen omega-3 acid ethyl esters (LOVAZA) 1 g capsule Take 2 capsules (2 g total) by mouth 2 (two) times daily. (Patient taking differently: Take 2-3 g by mouth See admin instructions. Patient takes 1 tablet in the morning and 2 tablets at bedtime) 360 capsule 1  . OVER THE COUNTER MEDICATION Apply 1 application topically daily as needed (For sores or pain).    . QUEtiapine (SEROQUEL XR) 400 MG 24 hr tablet Take 1 tablet by mouth at bedtime.    . rosuvastatin (CRESTOR) 40 MG tablet Take 1 tablet (40 mg total) by mouth daily. TO REPLACE ATORVASTATIN 80MG  (Patient not taking: Reported on 04/14/2020) 90 tablet 1  .  traZODone (DESYREL) 100 MG tablet Take 1 tablet by mouth at bedtime.     No current facility-administered medications on file prior to visit.    No Known Allergies  Assessment/Plan:  1. Hyperlipidemia -  Patient LDL was 68 which was at goal of <70.  However, this was when patient was on rosuvastatin and liver enzymes elevated.    Unknown what current LDL is since discontinuing statin.  Recommended patient try Repatha and patient was agreeable.  Counseled on Repatha, including storage, warming to room temperature, site selection, administration and scheduling using demonstration pen.  Patient voiced understanding.  Advised we would apply for [employer] grant to help patient with copay. Since patient is motivated to quit smoking but can not afford to pay out of pocket for patches, will contact patient with information from [place] quitline.  Laural Golden, PharmD, BCACP,  CDCES [facility] [address], [place], [place] [address] Phone: [phone]; Fax: [phone] 05/13/2020 4:40 PM

## 2020-05-13 NOTE — Patient Instructions (Signed)
It was nice meeting you today!  You goal LDL is less than 70.  Continue your Zetia 10 mg once daily.  We will apply for copay assistance for you to start Repatha Once approved, you will inject once every 14 days  We will recheck your cholesterol in 3 months.  Please give Juan Gates a call with any questions.  [phone]

## 2020-05-20 ENCOUNTER — Telehealth: Payer: Self-pay

## 2020-05-20 MED ORDER — REPATHA SURECLICK 140 MG/ML ~~LOC~~ SOAJ: 140.0000 mg | SUBCUTANEOUS | 11 refills | Status: DC

## 2020-05-20 NOTE — Telephone Encounter (Signed)
lmomed the pt stated that they were approved for repatha, rx sent, pt instructed to call back if unaffordable

## 2020-05-26 NOTE — Telephone Encounter (Signed)
Patient called to see if the repatha was sent to his pharmacy. I informed him it had been. He stated he would check with his pharmacy.

## 2020-06-04 ENCOUNTER — Telehealth: Payer: Self-pay | Admitting: Cardiovascular Disease

## 2020-06-04 NOTE — Telephone Encounter (Signed)
Left message on machine.

## 2020-06-04 NOTE — Telephone Encounter (Signed)
New Message  Pt called and needed to speak with a nurse about his medicine Evolocumab (REPATHA SURECLICK) 140 MG/ML SOAJ. Wants someone to call him asap. Has questions to ask about the medication.

## 2020-06-04 NOTE — Telephone Encounter (Signed)
Spoke with patient - he does not qualify for copay card as he is on a Medicare plan.   Gave information about [employer] - he will apply on line

## 2020-06-15 NOTE — Telephone Encounter (Signed)
Patient is calling to follow up in regards to medication. He states he would like to make the nurse aware that he will be picking up medication tomorrow, 06/16/20.

## 2020-07-14 ENCOUNTER — Other Ambulatory Visit: Payer: Self-pay | Admitting: Cardiovascular Disease

## 2020-08-11 ENCOUNTER — Other Ambulatory Visit: Payer: Self-pay

## 2020-08-11 DIAGNOSIS — I25810 Atherosclerosis of coronary artery bypass graft(s) without angina pectoris: Secondary | ICD-10-CM

## 2020-08-11 DIAGNOSIS — E7800 Pure hypercholesterolemia, unspecified: Secondary | ICD-10-CM

## 2020-08-11 LAB — LIPID PANEL
Chol/HDL Ratio: 3.8 ratio (ref 0.0–5.0)
Cholesterol, Total: 150 mg/dL (ref 100–199)
HDL: 40 mg/dL (ref >39)
LDL Chol Calc (NIH): 74 mg/dL (ref 0–99)
Triglycerides: 214 mg/dL — ABNORMAL HIGH (ref 0–149)
VLDL Cholesterol Cal: 36 mg/dL (ref 5–40)

## 2020-08-17 ENCOUNTER — Telehealth: Payer: Self-pay | Admitting: Cardiovascular Disease

## 2020-08-17 NOTE — Telephone Encounter (Signed)
Lm to call back ./cy 

## 2020-08-17 NOTE — Telephone Encounter (Signed)
Patient called and states he has not gotten any word about his recent lab results. He tried to log in to MyChart but had difficulty logging in. Please call him to discuss lab results

## 2020-08-20 NOTE — Telephone Encounter (Signed)
Left message for pt to call.

## 2020-08-20 NOTE — Telephone Encounter (Signed)
Spoke with pt, lipid profile results discussed.

## 2020-09-03 ENCOUNTER — Other Ambulatory Visit: Payer: Self-pay | Admitting: Cardiovascular Disease

## 2020-10-19 ENCOUNTER — Encounter: Payer: Self-pay | Admitting: Cardiovascular Disease

## 2020-10-19 ENCOUNTER — Telehealth (INDEPENDENT_AMBULATORY_CARE_PROVIDER_SITE_OTHER): Payer: Medicare PPO | Admitting: Cardiovascular Disease

## 2020-10-19 DIAGNOSIS — I10 Essential (primary) hypertension: Secondary | ICD-10-CM | POA: Diagnosis not present

## 2020-10-19 DIAGNOSIS — Z951 Presence of aortocoronary bypass graft: Secondary | ICD-10-CM

## 2020-10-19 DIAGNOSIS — E785 Hyperlipidemia, unspecified: Secondary | ICD-10-CM

## 2020-10-19 DIAGNOSIS — I214 Non-ST elevation (NSTEMI) myocardial infarction: Secondary | ICD-10-CM | POA: Diagnosis not present

## 2020-10-19 DIAGNOSIS — Z720 Tobacco use: Secondary | ICD-10-CM

## 2020-10-19 NOTE — Patient Instructions (Signed)
Medication Instructions:  Your physician recommends that you continue on your current medications as directed. Please refer to the Current Medication list given to you today.  *If you need a refill on your cardiac medications before your next appointment, please call your pharmacy*   Lab Work: FASTING lab work in 6 months, before your next visit with Dr. Tresa Endo - lipid panel, CMET, CBC, TSH, A1c  If you have labs (blood work) drawn today and your tests are completely normal, you will receive your results only by: Marland Kitchen MyChart Message (if you have MyChart) OR . A paper copy in the mail If you have any lab test that is abnormal or we need to change your treatment, we will call you to review the results.   Testing/Procedures: Echocardiogram in 6 months (~April/May 2022)   Follow-Up: At [facility], you and your health needs are our priority.  As part of our continuing mission to provide you with exceptional heart care, we have created designated Provider Care Teams.  These Care Teams include your primary Cardiologist (physician) and Advanced Practice Providers (APPs -  Physician Assistants and Nurse Practitioners) who all work together to provide you with the care you need, when you need it.  We recommend signing up for the patient portal called "MyChart".  Sign up information is provided on this After Visit Summary.  MyChart is used to connect with patients for Virtual Visits (Telemedicine).  Patients are able to view lab/test results, encounter notes, upcoming appointments, etc.  Non-urgent messages can be sent to your provider as well.   To learn more about what you can do with MyChart, go to [email or web address].    Your next appointment:   6 month(s) - after echocardiogram   The format for your next appointment:   In Person  Provider:   You may see Nicki Guadalajara, MD or one of the following Advanced Practice Providers on your designated Care Team:    Azalee Course, PA-C  Micah Flesher, PA-C or   Judy Pimple, [place]    Other Instructions

## 2020-10-19 NOTE — Progress Notes (Signed)
Virtual Visit via Video Note   This visit type was conducted due to national recommendations for restrictions regarding the COVID-19 Pandemic (e.g. social distancing) in an effort to limit this patient's exposure and mitigate transmission in our community.  Due to his co-morbid illnesses, this patient is at least at moderate risk for complications without adequate follow up.  This format is felt to be most appropriate for this patient at this time.  All issues noted in this document were discussed and addressed.  A limited physical exam was performed with this format.  Please refer to the patient's chart for his consent to telehealth for [facility].       Date:  10/19/2020   ID:  Juan Gates, DOB March 27, 1956, MRN [id number] The patient was identified using 2 identifiers.  Patient Location: Home Provider Location: Office/Clinic  PCP:  Fleet ContrasAvbuere, Edwin, MD  Cardiologist:  Nicki Guadalajarahomas Elisha Cooksey, MD  Electrophysiologist:  None   Evaluation Performed:  Follow-Up Visit  Chief Complaint:  29 month F/U with me  History of Present Illness:    Juan PlowmanDaniel Gates is a 64 y.o. male was last seen by me in May 2019.  He has a history of longstanding tobacco use, CAD, hypertension and hyperlipidemia.  He suffered a NSTEMI and underwent cardiac catheterization by Dr. Herbie BaltimoreHarding on 12/31/2012.  On 01/03/2013 he underwent CABG surgery x2 by Dr. Barry Dieneswens with a right internal mammary artery to the obtuse marginal, and left internal mammary artery to the LAD. He had preserved LV function. He also had hyperlipidemia. Unfortunately the patient has continued to smoke cigarettes.  I  He was married for the third time in September 2017. He has been on disability since his MI in January 2014.  Previously he was in the construction business.  I last saw him in May 2019 and since his prior evaluation in November 2017 at that time he denied any chest pain or shortness of breath.  He was not routinely exercising. He had  undergone laboratory in December 2017's time he was on atorvastatin 80 mg, omega-3 fatty acids, and also was on fenofibrate.  His LDL cholesterol was 91 triglycerides 176.  Zetia was added to his regimen and he was taken off fenofibrate.  He states that he often would eat out at fast food places. However since Easter he and his wife have significantly changed her diet.  He has lost 7 pounds over the past 2 weeks.  He has markedly reduced his carbohydrate intake and he has avoided fast foods.  He did undergo repeat lab work by his primary physician in February which was abnormal with a total cholesterol 193, LDL 126 and triglycerides [id number].  He had normal renal function, thyroid studies, PSA, and CBC.  He continuesdto smoke cigarettes and has been smoking for over 40 years.  He denied palpitations.  He denied difficulty with sleep.  Since I last saw him, [facility]  He presented to the emergency room in February 2021 with chest pain.  Troponin was negative.  CT angio of his chest was negative for PE.  He was found to have bilateral basilar groundglass opacities and streaky airspace opacities and was treated with with antibiotics for presumed pneumonia.  He was seen by Azalee CourseHao Meng, PA in April 2021 in follow-up evaluation and at that time remained cardiac stable.  Presently, he is on disability.  He states he remains active doing yard work.  He does not routinely exercise.  He is still smoking a pack a day.  He has continued to be on lisinopril 5 mg metoprolol tartrate 75 mg twice a day.  His last echo Doppler study was in January 2014 at the time of his CABG revascularization.  He denies any anginal symptoms or palpitations.  He denies any significant change in activity status and specifically is without exertional dyspnea.  He is now on Repatha for lipid lowering therapy in August 2021 LDL cholesterol was 74 with total cholesterol 150.  He presents for evaluation.   The  patient does not have symptoms concerning for COVID-19 infection (fever, chills, cough, or new shortness of breath).    Past Medical History:  Diagnosis Date  . Anxiety   . Arthritis    knees, right hip  . Complication of anesthesia    "started fighting when woke up with tube down throat"  . Coronary artery disease   . Hyperlipidemia   . Hypertension   . Myocardial infarction [facility]) Jan. 6 2014  . 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 CARDIAC CATHETERIZATION  12/31/2012   2 vessel cors 90%hazy stenosis prox circ with long segment 60-70%LAD,mod RCA,severe lesion small marginal branch  . CORONARY ARTERY BYPASS GRAFT  01/03/2013   Procedure: CORONARY ARTERY BYPASS GRAFTING (CABG);  Surgeon: Purcell Nails, MD;  Location: [facility] OR;  Service: Open Heart Surgery;  Laterality: N/A;  . HERNIA REPAIR  07/22/13   open RIH  . INGUINAL HERNIA REPAIR Left 07/22/2013   Procedure: OPEN LEFT INGUINAL HERNIA REPAIR ;  Surgeon: Mariella Saa, MD;  Location: [facility] ORS;  Service: General;  Laterality: Left;  . INSERTION OF MESH Left 07/22/2013   Procedure: INSERTION OF MESH;  Surgeon: Mariella Saa, MD;  Location: [facility] ORS;  Service: General;  Laterality: Left;  . INTRAOPERATIVE TRANSESOPHAGEAL ECHOCARDIOGRAM  01/03/2013   Procedure: INTRAOPERATIVE TRANSESOPHAGEAL ECHOCARDIOGRAM;  Surgeon: Purcell Nails, MD;  Location: [facility] OR;  Service: Open Heart Surgery;  Laterality: N/A;  . LEFT HEART CATHETERIZATION WITH CORONARY ANGIOGRAM N/A 01/01/2013   Procedure: LEFT HEART CATHETERIZATION WITH CORONARY ANGIOGRAM;  Surgeon: [name], MD;  Location: [facility] CATH LAB;  Service: Cardiovascular;  Laterality: N/A;     Current Meds  Medication Sig  . aspirin 81 MG tablet Take 81 mg by mouth daily.  . Evolocumab (REPATHA SURECLICK) 140 MG/ML SOAJ Inject 140 mg into the skin every 14 (fourteen) days.  Marland Kitchen HYDROcodone-acetaminophen (NORCO/VICODIN) 5-325  MG tablet Take 1 tablet by mouth every 6 (six) hours as needed for moderate pain.  . hydrOXYzine (ATARAX/VISTARIL) 25 MG tablet Take 25 mg by mouth at bedtime.  Marland Kitchen lisinopril (ZESTRIL) 5 MG tablet TAKE 1 TABLET (5 MG TOTAL) BY MOUTH DAILY.  . metoprolol tartrate (LOPRESSOR) 50 MG tablet TAKE 1 AND 1/2 TABLETS (75 MG TOTAL)  TWO TIMES DAILY  . naproxen (NAPROSYN) 500 MG tablet Take 500 mg by mouth daily as needed for mild pain.   Marland Kitchen omega-3 acid ethyl esters (LOVAZA) 1 g capsule Take 2 capsules (2 g total) by mouth 2 (two) times daily. (Patient taking differently: Take 2-3 g by mouth See admin instructions. Patient takes 1 tablet in the morning and 2 tablets at bedtime)  . QUEtiapine (SEROQUEL XR) 400 MG 24 hr tablet Take 1 tablet by mouth at bedtime.  . traZODone (DESYREL) 100 MG tablet Take 1 tablet by mouth at bedtime.  Allergies:   Atorvastatin and Rosuvastatin   Social History   Tobacco Use  . Smoking status: Current Every Day Smoker    Packs/day: 1.00    Years: 20.00    Pack years: 20.00    Types: Cigarettes  . Smokeless tobacco: Never Used  Substance Use Topics  . Alcohol use: Yes    Comment: beers "every now and then"  . Drug use: No     Family Hx: The patient's family history includes Cancer in his brother and father; Hypertension in his mother.  ROS:   Please see the history of present illness.    No fever chills or night sweats No cough History pneumonia February 2021 No anginal symptoms No exertional dyspnea No palpitations Feels better since initiating Repatha Prior LFT elevation on statin Sleeping well All other systems reviewed and are negative.   Prior CV studies:   The following studies were reviewed today:  I reviewed the patient's record since his last evaluation with me in May 2019.  Labs/Other Tests and Data Reviewed:    EKG:  An ECG dated 04/15/2020 was personally reviewed today and demonstrated:  Sinus bradycardia 57 bpm.  Poor R wave  progression suggestive of prior MI V1 through V3  Recent Labs: 02/18/2020: Hemoglobin 15.6; Platelets 186 03/24/2020: BUN 18; Creatinine, Ser 0.99; Potassium 4.6; Sodium 138 04/14/2020: ALT 24   Recent Lipid Panel Lab Results  Component Value Date/Time   CHOL 150 08/11/2020 08:15 AM   TRIG 214 (H) 08/11/2020 08:15 AM   HDL 40 08/11/2020 08:15 AM   CHOLHDL 3.8 08/11/2020 08:15 AM   CHOLHDL 4.8 12/16/2016 09:24 AM   LDLCALC 74 08/11/2020 08:15 AM    Wt Readings from Last 3 Encounters:  10/19/20 213 lb (96.6 kg)  05/13/20 223 lb (101.2 kg)  04/14/20 229 lb (103.9 kg)      Objective:    Vital Signs:  BP 130/78   Wt 213 lb (96.6 kg)   BMI 28.10 kg/m    Since this was a video visit he was not in any acute distress. Breathing was normal and not labored There was no audible wheezing He felt his rhythm was stable He denied any chest wall tenderness There was no swelling He had normal mood and affect  ASSESSMENT & PLAN:    1. CAD/status post CABG revascularization surgery.  He suffered a non-STEMI in January 2014 and underwent CABG revascularization surgery x2 with a RIMA to the OM and LIMA to the LAD by Dr. Cornelius Moras.  He denies any recurrent anginal symptoms.  He is unaware of palpitations. 2. Essential hypertension: Blood pressure currently controlled on metoprolol tartrate 75 mg twice a day and lisinopril 5 mg 3. Hyperlipidemia: Currently on Repatha in addition to omega-3 fatty acids.  The past he developed increased LFTs on statins 4. Tobacco use: He has been smoking 1 pack/day for 30 to 40 years. 5. History of pneumonia February 2021 treated with antibiotic therapy with azithromycin and cefdinir.  Resolution of symptoms  COVID-19 Education: The signs and symptoms of COVID-19 were discussed with the patient and how to seek care for testing (follow up with PCP or arrange E-visit).  The importance of social distancing was discussed today.  Time:   Today, I have spent  with the patient with telehealth technology discussing the above problems.     Medication Adjustments/Labs and Tests Ordered: Current medicines are reviewed at length with the patient today.  Concerns regarding medicines are outlined above.  Tests Ordered: Orders Placed This Encounter  Procedures  . Comprehensive metabolic panel  . CBC  . Lipid panel  . TSH  . Hemoglobin A1c  . ECHOCARDIOGRAM COMPLETE    Medication Changes: No orders of the defined types were placed in this encounter.   Follow Up: 6 months I have recommended he undergo an echo Doppler study (8-year follow-up), fasting laboratory with plans for follow-up office visit.  Signed, Nicki Guadalajara, MD  10/19/2020 2:39 PM    [facility]

## 2020-12-09 ENCOUNTER — Other Ambulatory Visit: Payer: Self-pay | Admitting: Cardiovascular Disease

## 2020-12-16 ENCOUNTER — Emergency Department (HOSPITAL_BASED_OUTPATIENT_CLINIC_OR_DEPARTMENT_OTHER): Payer: Medicare PPO

## 2020-12-16 ENCOUNTER — Encounter (HOSPITAL_BASED_OUTPATIENT_CLINIC_OR_DEPARTMENT_OTHER): Payer: Self-pay

## 2020-12-16 ENCOUNTER — Other Ambulatory Visit: Payer: Self-pay

## 2020-12-16 ENCOUNTER — Emergency Department (HOSPITAL_BASED_OUTPATIENT_CLINIC_OR_DEPARTMENT_OTHER)
Admission: EM | Admit: 2020-12-16 | Discharge: 2020-12-16 | Disposition: A | Discharge status: Home / Self Care | Payer: Medicare PPO | Attending: Emergency Medicine | Admitting: Emergency Medicine

## 2020-12-16 DIAGNOSIS — S80912A Unspecified superficial injury of left knee, initial encounter: Secondary | ICD-10-CM | POA: Insufficient documentation

## 2020-12-16 DIAGNOSIS — Z23 Encounter for immunization: Secondary | ICD-10-CM | POA: Insufficient documentation

## 2020-12-16 DIAGNOSIS — I119 Hypertensive heart disease without heart failure: Secondary | ICD-10-CM | POA: Diagnosis not present

## 2020-12-16 DIAGNOSIS — Z7982 Long term (current) use of aspirin: Secondary | ICD-10-CM | POA: Insufficient documentation

## 2020-12-16 DIAGNOSIS — W294XXA Contact with nail gun, initial encounter: Secondary | ICD-10-CM | POA: Diagnosis not present

## 2020-12-16 DIAGNOSIS — Z79899 Other long term (current) drug therapy: Secondary | ICD-10-CM | POA: Insufficient documentation

## 2020-12-16 DIAGNOSIS — F17210 Nicotine dependence, cigarettes, uncomplicated: Secondary | ICD-10-CM | POA: Insufficient documentation

## 2020-12-16 DIAGNOSIS — I2510 Atherosclerotic heart disease of native coronary artery without angina pectoris: Secondary | ICD-10-CM | POA: Diagnosis not present

## 2020-12-16 MED ORDER — LIDOCAINE HCL (PF) 1 % IJ SOLN
5.0000 mL | Freq: Once | INTRAMUSCULAR | Status: AC
  Administered 2020-12-16: 12:00:00 5 mL
  Filled 2020-12-16: qty 5

## 2020-12-16 MED ORDER — CEPHALEXIN 500 MG PO CAPS: 500.0000 mg | ORAL_CAPSULE | Freq: Four times a day (QID) | ORAL | 0 refills | Status: DC

## 2020-12-16 MED ORDER — TETANUS-DIPHTH-ACELL PERTUSSIS 5-2.5-18.5 LF-MCG/0.5 IM SUSY
0.5000 mL | PREFILLED_SYRINGE | Freq: Once | INTRAMUSCULAR | Status: AC
  Administered 2020-12-16: 12:00:00 0.5 mL via INTRAMUSCULAR
  Filled 2020-12-16: qty 0.5

## 2020-12-16 NOTE — ED Triage Notes (Signed)
Pt was working when the [employer] and shot his left knee. States nail about 2.5-3". Denies numbness/tingling

## 2020-12-16 NOTE — Discharge Instructions (Addendum)
Call your primary care doctor or specialist as discussed in the next 2-3 days.   Return immediately back to the ER if:  Your symptoms worsen within the next 12-24 hours. You develop new symptoms such as new fevers, persistent vomiting, new pain, shortness of breath, or new weakness or numbness, or if you have any other concerns.  

## 2020-12-16 NOTE — ED Provider Notes (Signed)
[facility] EMERGENCY DEPARTMENT Provider Note   CSN: [id number] Arrival date & time: 12/16/20  1058     History Chief Complaint  Patient presents with   Knee Injury    Nail gun    Juan Gates is a 64 y.o. male.  Patient presents chief complaint left knee pain.  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  Presents immediately to the ER.  He states he is ambulatory with this although complaining of pain.  Denies injury elsewhere.  No recent illnesses no fever cough vomiting or diarrhea.  He does not recall his last tetanus shot.        Past Medical History:  Diagnosis Date   Anxiety    Arthritis    knees, right hip   Complication of anesthesia    "started fighting when woke up with tube down throat"   Coronary artery disease    Hyperlipidemia    Hypertension    Myocardial infarction [facility]) Jan. 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   LIMA to LAD, RIMA to OM1    Patient Active Problem List   Diagnosis Date Noted   Hyperlipidemia LDL goal <70 11/13/2014   CAD (coronary artery disease) 11/13/2014   Musculoskeletal neck pain 05/16/2013   Abnormal resting ECG findings - diffuse mild STE 05/16/2013   Shoulder pain - likley muscle strain 05/16/2013   S/P CABG x 2, (LIMA-LAD and RIMA-OM) 01/03/13 01/03/2013   Hypercholesterolemia 01/02/2013   NSTEMI- S/P cath 1/7- severe 2 vessel dz; EF 55% 12/31/2012   Smoker 12/31/2012    Past Surgical History:  Procedure Laterality Date   CARDIAC CATHETERIZATION  12/31/2012   2 vessel cors 90%hazy stenosis prox circ with long segment 60-70%LAD,mod RCA,severe lesion small marginal branch   CORONARY ARTERY BYPASS GRAFT  01/03/2013   Procedure: CORONARY ARTERY BYPASS GRAFTING (CABG);  Surgeon: Purcell Nails, MD;  Location: [facility] OR;  Service: Open Heart Surgery;  Laterality: N/A;   HERNIA REPAIR  07/22/13   open RIH   INGUINAL HERNIA REPAIR Left 07/22/2013    Procedure: OPEN LEFT INGUINAL HERNIA REPAIR ;  Surgeon: Mariella Saa, MD;  Location: [facility] ORS;  Service: General;  Laterality: Left;   INSERTION OF MESH Left 07/22/2013   Procedure: INSERTION OF MESH;  Surgeon: Mariella Saa, MD;  Location: [facility] ORS;  Service: General;  Laterality: Left;   INTRAOPERATIVE TRANSESOPHAGEAL ECHOCARDIOGRAM  01/03/2013   Procedure: INTRAOPERATIVE TRANSESOPHAGEAL ECHOCARDIOGRAM;  Surgeon: Purcell Nails, MD;  Location: [facility] OR;  Service: Open Heart Surgery;  Laterality: N/A;   LEFT HEART CATHETERIZATION WITH CORONARY ANGIOGRAM N/A 01/01/2013   Procedure: LEFT HEART CATHETERIZATION WITH CORONARY ANGIOGRAM;  Surgeon: [name], MD;  Location: [facility] CATH LAB;  Service: Cardiovascular;  Laterality: N/A;       Family History  Problem Relation Age of Onset   Hypertension Mother    Cancer Father        bone cancer   Cancer Brother        metastatic melanoma    Social History   Tobacco Use   Smoking status: Current Every Day Smoker    Packs/day: 1.00    Years: 20.00    Pack years: 20.00    Types: Cigarettes   Smokeless tobacco: Never Used  Substance Use Topics   Alcohol use: Yes    Comment: beers "every now and then"   Drug use: No  Home Medications Prior to Admission medications   Medication Sig Start Date End Date Taking? Authorizing Provider  aspirin 81 MG tablet Take 81 mg by mouth daily.    [provider]  cephALEXin (KEFLEX) 500 MG capsule Take 1 capsule (500 mg total) by mouth 4 (four) times daily. 12/16/20   Cheryll Cockayne, MD  Evolocumab (REPATHA SURECLICK) 140 MG/ML SOAJ Inject 140 mg into the skin every 14 (fourteen) days. 05/20/20   Lennette Bihari, MD  ezetimibe (ZETIA) 10 MG tablet TAKE 1 TABLET EVERY DAY 12/10/20   Lennette Bihari, MD  HYDROcodone-acetaminophen (NORCO/VICODIN) 5-325 MG tablet Take 1 tablet by mouth every 6 (six) hours as needed for moderate pain.    [provider]  hydrOXYzine  (ATARAX/VISTARIL) 25 MG tablet Take 25 mg by mouth at bedtime. 04/27/18   [provider]  lisinopril (ZESTRIL) 5 MG tablet TAKE 1 TABLET (5 MG TOTAL) BY MOUTH DAILY. 09/04/20   Lennette Bihari, MD  metoprolol tartrate (LOPRESSOR) 50 MG tablet TAKE 1 AND 1/2 TABLETS (75 MG TOTAL)  TWO TIMES DAILY 09/04/20   Lennette Bihari, MD  naproxen (NAPROSYN) 500 MG tablet Take 500 mg by mouth daily as needed for mild pain.  12/24/19   [provider]  omega-3 acid ethyl esters (LOVAZA) 1 g capsule Take 2 capsules (2 g total) by mouth 2 (two) times daily. Patient taking differently: Take 2-3 g by mouth See admin instructions. Patient takes 1 tablet in the morning and 2 tablets at bedtime 01/02/20   Lennette Bihari, MD  QUEtiapine (SEROQUEL XR) 400 MG 24 hr tablet Take 1 tablet by mouth at bedtime. 11/15/16   [provider]  traZODone (DESYREL) 100 MG tablet Take 1 tablet by mouth at bedtime. 09/09/16   [provider]    Allergies    Atorvastatin and Rosuvastatin  Review of Systems   Review of Systems  Constitutional: Negative for fever.  HENT: Negative for ear pain and sore throat.   Eyes: Negative for pain.  Respiratory: Negative for cough.   Cardiovascular: Negative for chest pain.  Gastrointestinal: Negative for abdominal pain.  Genitourinary: Negative for flank pain.  Musculoskeletal: Negative for back pain.  Skin: Negative for color change and rash.  Neurological: Negative for syncope.  All other systems reviewed and are negative.   Physical Exam Updated Vital Signs BP 131/75 (BP Location: Right Arm)    Pulse 76    Temp 98.6 F (37 C) (Oral)    Resp 18    Ht 6' 1.5" (1.867 m)    Wt 95.7 kg    SpO2 99%    BMI 27.46 kg/m   Physical Exam Constitutional:      General: He is not in acute distress.    Appearance: He is well-developed.  HENT:     Head: Normocephalic.     Mouth/Throat:     Mouth: Mucous membranes are moist.  Cardiovascular:     Rate and  Rhythm: Normal rate.  Pulmonary:     Effort: Pulmonary effort is normal.  Abdominal:     Palpations: Abdomen is soft.  Musculoskeletal:     Right lower leg: No edema.     Left lower leg: No edema.     Comments: Left lower extremity on the patella appears to be approximately 4 inch nail halfway embedded into the left patella.  Patient still able to range his knee with mild discomfort.  Able to weight-bear and ambulate with mild  discomfort.  Neurovascular tact distally.  No active bleeding noted.  Skin:    General: Skin is warm.     Capillary Refill: Capillary refill takes less than 2 seconds.  Neurological:     General: No focal deficit present.     Mental Status: He is alert.     ED Results / Procedures / Treatments   Labs (all labs ordered are listed, but only abnormal results are displayed) Labs Reviewed - No data to display  EKG None  Radiology DG Knee Complete 4 Views Left  Result Date: 12/16/2020 CLINICAL DATA:  Nail gun injury. EXAM: LEFT KNEE - COMPLETE 4+ VIEW COMPARISON:  None. FINDINGS: Nail is identified in the anterior soft tissues of the knee. The nail is apparently contiguous with the anterior cortex of the patella but there is no evidence for patellar fracture or cortical disruption. No evidence for effusion in the suprapatellar bursa. Hypertrophic spurring is no medial and patellofemoral compartments with associated medial joint space narrowing. IMPRESSION: Nail is identified in the anterior soft tissues of the knee without underlying bony abnormality. The nail appears to be contiguous with the anterior cortex of the patella but no patellar fracture is evident. No joint effusion. Degenerative changes. Electronically Signed   By: Kennith Center M.D.   On: 12/16/2020 11:30    Procedures Procedures (including critical care time)  Medications Ordered in ED Medications  Tdap (BOOSTRIX) injection 0.5 mL (0.5 mLs Intramuscular Given 12/16/20 1156)  lidocaine (PF)  (XYLOCAINE) 1 % injection 5 mL (5 mLs Infiltration Given 12/16/20 1156)    ED Course  I have reviewed the triage vital signs and the nursing notes.  Pertinent labs & imaging results that were available during my care of the patient were reviewed by me and considered in my medical decision making (see chart for details).    MDM Rules/Calculators/A&P                          Nail went through patient's jeans and a black thermal he had underneath.  X-rays show the nail just parallel to the patella.  However on exam I feel the might have been embedded into the cortex of the patella given the resistance.  Lidocaine 1% total of 3 cc instilled at the nail insertion site.  Nail was pulled out without any complication.  I advised the patient that may be foreign body material embedded as the nail went through his close.  We discussed cutting a larger incision to try to remove any foreign body, the patient declined the procedure and would rather follow-up with specialist.  Risk of infection and complication discussed with patient and joint decision made to forego further exploration at this time.  Patient given antibiotics, advised follow-up with orthopedic surgery within the week.  Advised immediate return for increased redness purulent discharge fevers pain or any additional concerns.    Final Clinical Impression(s) / ED Diagnoses Final diagnoses:  Injury by nail gun, initial encounter    Rx / DC Orders ED Discharge Orders         Ordered    cephALEXin (KEFLEX) 500 MG capsule  4 times daily        12/16/20 1220           Cheryll Cockayne, MD 12/16/20 1220

## 2020-12-30 ENCOUNTER — Telehealth: Payer: Self-pay | Admitting: Family Medicine

## 2020-12-30 NOTE — Telephone Encounter (Signed)
Called pt to offer Ed f/u appt w/ provider--no answer, person answered then hung up--will cb later.  --glh

## 2021-03-14 ENCOUNTER — Other Ambulatory Visit: Payer: Self-pay | Admitting: Cardiovascular Disease

## 2021-04-08 ENCOUNTER — Other Ambulatory Visit: Payer: Self-pay | Admitting: Cardiovascular Disease

## 2021-04-19 ENCOUNTER — Other Ambulatory Visit (HOSPITAL_COMMUNITY): Payer: Medicare PPO

## 2021-04-26 ENCOUNTER — Ambulatory Visit: Payer: Medicare PPO | Admitting: Cardiovascular Disease

## 2021-05-13 ENCOUNTER — Other Ambulatory Visit: Payer: Self-pay | Admitting: Cardiovascular Disease

## 2021-05-13 ENCOUNTER — Other Ambulatory Visit (HOSPITAL_COMMUNITY): Payer: Medicare PPO

## 2021-05-21 ENCOUNTER — Other Ambulatory Visit: Payer: Self-pay | Admitting: Cardiovascular Disease

## 2021-05-25 ENCOUNTER — Telehealth: Payer: Self-pay | Admitting: Cardiovascular Disease

## 2021-05-25 MED ORDER — REPATHA 140 MG/ML ~~LOC~~ SOSY: PREFILLED_SYRINGE | SUBCUTANEOUS | 11 refills | Status: DC

## 2021-05-25 NOTE — Telephone Encounter (Signed)
   *  STAT* If patient is at the pharmacy, call can be transferred to refill team.   1. Which medications need to be refilled? (please list name of each medication and dose if known)   REPATHA 140 MG/ML SOSY   2. Which pharmacy/location (including street and city if local pharmacy) is medication to be sent to? [facility] [address], [place], [place] [address]  3. Do they need a 30 day or 90 day supply? 30 days  Pt moved and have new pharmacy, [facility] [employer]. Also, pt is asking if he needs to re-enroll for pt assistance with health well for his repatha

## 2021-05-25 NOTE — Telephone Encounter (Signed)
REFILL SENT TO [id number]

## 2021-06-04 ENCOUNTER — Other Ambulatory Visit: Payer: Self-pay | Admitting: Cardiovascular Disease

## 2021-07-26 ENCOUNTER — Other Ambulatory Visit (HOSPITAL_COMMUNITY): Payer: Medicare PPO

## 2021-08-17 ENCOUNTER — Ambulatory Visit: Payer: Medicare PPO | Admitting: Cardiovascular Disease

## 2021-09-03 ENCOUNTER — Telehealth (HOSPITAL_COMMUNITY): Payer: Self-pay | Admitting: Cardiovascular Disease

## 2021-09-03 NOTE — Telephone Encounter (Signed)
Noted. Thanks.

## 2021-09-03 NOTE — Telephone Encounter (Signed)
Patient called and cancelled echocardiogram for the reason below:  09/03/2021 12:24 PM RD:[id number], KIMBERLY S  Cancel Rsn: Patient (per answering service due to transportation, will call back to reschedule)  Order will be removed from the Active echo wq and when patient calls back to reschedule we will reinstate the order or create a new one... Thank you

## 2021-09-06 ENCOUNTER — Other Ambulatory Visit (HOSPITAL_COMMUNITY): Payer: Medicare PPO

## 2021-11-23 ENCOUNTER — Encounter: Payer: Self-pay | Admitting: Cardiovascular Disease

## 2021-11-23 NOTE — Telephone Encounter (Signed)
Error. No note needed  

## 2021-11-25 ENCOUNTER — Ambulatory Visit: Payer: Medicare PPO | Admitting: Cardiovascular Disease

## 2022-01-17 NOTE — Progress Notes (Signed)
Cardiology Clinic Note   Patient Name: Juan Gates Date of Encounter: 01/21/2022  Primary Care Provider:  Nolene Ebbs, MD Primary Cardiologist:  Shelva Majestic, MD  Patient Profile    Juan Gates 66 year old male presents to the clinic today for follow-up evaluation of his coronary artery disease and hyperlipidemia.  Past Medical History    Past Medical History:  Diagnosis Date   Anxiety    Arthritis    knees, right hip   Complication of anesthesia    "started fighting when woke up with tube down throat"   Coronary artery disease    Hyperlipidemia    Hypertension    Myocardial infarction [facility]) Jan. 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   LIMA to LAD, RIMA to OM1   Past Surgical History:  Procedure Laterality Date   CARDIAC CATHETERIZATION  12/31/2012   2 vessel cors 90%hazy stenosis prox circ with long segment 60-70%LAD,mod RCA,severe lesion small marginal branch   CORONARY ARTERY BYPASS GRAFT  01/03/2013   Procedure: CORONARY ARTERY BYPASS GRAFTING (CABG);  Surgeon: Rexene Alberts, MD;  Location: [place];  Service: Open Heart Surgery;  Laterality: N/A;   HERNIA REPAIR  07/22/13   open RIH   INGUINAL HERNIA REPAIR Left 07/22/2013   Procedure: OPEN LEFT INGUINAL HERNIA REPAIR ;  Surgeon: Edward Jolly, MD;  Location: [facility];  Service: General;  Laterality: Left;   INSERTION OF MESH Left 07/22/2013   Procedure: INSERTION OF MESH;  Surgeon: Edward Jolly, MD;  Location: [facility];  Service: General;  Laterality: Left;   INTRAOPERATIVE TRANSESOPHAGEAL ECHOCARDIOGRAM  01/03/2013   Procedure: INTRAOPERATIVE TRANSESOPHAGEAL ECHOCARDIOGRAM;  Surgeon: Rexene Alberts, MD;  Location: [place];  Service: Open Heart Surgery;  Laterality: N/A;   LEFT HEART CATHETERIZATION WITH CORONARY ANGIOGRAM N/A 01/01/2013   Procedure: LEFT HEART CATHETERIZATION WITH CORONARY ANGIOGRAM;  Surgeon: Leonie Man, MD;  Location: [facility] CATH LAB;  Service: Cardiovascular;  Laterality: N/A;     Allergies  Allergies  Allergen Reactions   Atorvastatin     myalgias   Rosuvastatin     myalgia    History of Present Illness    Juan Gates has a PMH of coronary artery disease, NSTEMI 12/31/12, status post CABG x2 LIMA-LAD and RIMA-OM 01/03/2013, tobacco abuse, essential hypertension, and hyperlipidemia.  He was noted to have preserved LV function post CABG.  He remarried 9/17.  He was placed on disability after his MRI January 2014.  He was previously in [employer] business.  He was seen by Dr. Claiborne Billings 5/19 and denied chest pain shortness of breath.  He was not exercising regularly.  He had undergone lab tests 12/17 while he was on atorvastatin, omega-3's, and fenofibrate.  His LDL cholesterol was 91 and his triglycerides were 176.  Zetia was added to his medication regimen and he was taken off fenofibrate.  He reported that he was often needing at fast food restaurants.  He was working on changing his diet.  He has lost around 7 pounds in 2 weeks.  He was reducing his carbohydrates and avoiding fast food.  He had follow-up lab work by his PCP in February which showed a cholesterol of 193, LDL of 126 and triglycerides of 135.  He continued to smoke at that time.  He was seen in follow-up by Almyra Deforest PA-C 4/21 and remained stable from a cardiac standpoint.  He was seen by Dr. Claiborne Billings 10/19/2020.  He continued to be on disability.  He was active doing yard work.  He did not exercise regularly.  He continues to smoke about a pack per day.  His lisinopril and metoprolol were continued.  He denied chest pain and irregular heartbeats.  He denied exertional dyspnea.  He reported compliance with his Repatha.  His lipid panel 8/21 showed an LDL cholesterol of 74 and a total cholesterol of 150.  His blood pressure was well controlled.  He presents to the clinic today for follow-up evaluation states he feels well.  He continues to be on disability.  He reports that he is walking most days of the  week for about 30 minutes and also does push-ups in the morning.  His blood pressure continues to be well controlled.  In the clinic today it is 138/82.  He continues to smoke a pack per day or less.  We reviewed the importance of smoking cessation.  Due to the distance between where he currently lives in the clinic we will provide him with lab slips for [facility].  I will give him a salty 6 diet sheet, smoking cessation information, continue his current medication regimen, and plan follow-up for 1 week.  Today he denies chest pain, shortness of breath, lower extremity edema, fatigue, palpitations, melena, hematuria, hemoptysis, diaphoresis, weakness, presyncope, syncope, orthopnea, and PND.   Home Medications    Prior to Admission medications   Medication Sig Start Date End Date Taking? Authorizing Provider  aspirin 81 MG tablet Take 81 mg by mouth daily.    [provider]  cephALEXin (KEFLEX) 500 MG capsule Take 1 capsule (500 mg total) by mouth 4 (four) times daily. 12/16/20   Luna Fuse, MD  Evolocumab (REPATHA) 140 MG/ML SOSY INJECT 140 MG INTO THE SKIN EVERY 14 DAYS 05/25/21   Troy Sine, MD  ezetimibe (ZETIA) 10 MG tablet TAKE 1 TABLET EVERY DAY 12/10/20   Troy Sine, MD  HYDROcodone-acetaminophen (NORCO/VICODIN) 5-325 MG tablet Take 1 tablet by mouth every 6 (six) hours as needed for moderate pain.    [provider]  hydrOXYzine (ATARAX/VISTARIL) 25 MG tablet Take 25 mg by mouth at bedtime. 04/27/18   [provider]  lisinopril (ZESTRIL) 5 MG tablet TAKE 1 TABLET EVERY DAY. PLEASE SCHEDULE AN APPOINTMENT FOR FUTURE REFILLS. 05/25/21   Troy Sine, MD  metoprolol tartrate (LOPRESSOR) 50 MG tablet TAKE 1 AND 1/2 TABLETS TWICE DAILY. 06/04/21   Troy Sine, MD  naproxen (NAPROSYN) 500 MG tablet Take 500 mg by mouth daily as needed for mild pain.  12/24/19   [provider]  omega-3 acid ethyl esters (LOVAZA) 1 g capsule Take 2 capsules (2 g  total) by mouth 2 (two) times daily. Patient taking differently: Take 2-3 g by mouth See admin instructions. Patient takes 1 tablet in the morning and 2 tablets at bedtime 01/02/20   Troy Sine, MD  QUEtiapine (SEROQUEL XR) 400 MG 24 hr tablet Take 1 tablet by mouth at bedtime. 11/15/16   [provider]  traZODone (DESYREL) 100 MG tablet Take 1 tablet by mouth at bedtime. 09/09/16   [provider]    Family History    Family History  Problem Relation Age of Onset   Hypertension Mother    Cancer Father        bone cancer   Cancer Brother        metastatic melanoma   He indicated that his mother is deceased. He indicated that his father is deceased. He  indicated that the status of his brother is unknown.  Social History    Social History   Socioeconomic History   Marital status: Married    Spouse name: Not on file   Number of children: Not on file   Years of education: Not on file   Highest education level: Not on file  Occupational History   Not on file  Tobacco Use   Smoking status: Every Day    Packs/day: 1.00    Years: 20.00    Pack years: 20.00    Types: Cigarettes   Smokeless tobacco: Never  Substance and Sexual Activity   Alcohol use: Yes    Comment: beers "every now and then"   Drug use: No   Sexual activity: Not on file  Other Topics Concern   Not on file  Social History Narrative   Not on file   Social Determinants of Health   Financial Resource Strain: Not on file  Food Insecurity: Not on file  Transportation Needs: Not on file  Physical Activity: Not on file  Stress: Not on file  Social Connections: Not on file  Intimate Partner Violence: Not on file     Review of Systems    General:  No chills, fever, night sweats or weight changes.  Cardiovascular:  No chest pain, dyspnea on exertion, edema, orthopnea, palpitations, paroxysmal nocturnal dyspnea. Dermatological: No rash, lesions/masses Respiratory: No cough,  dyspnea Urologic: No hematuria, dysuria Abdominal:   No nausea, vomiting, diarrhea, bright red blood per rectum, melena, or hematemesis Neurologic:  No visual changes, wkns, changes in mental status. All other systems reviewed and are otherwise negative except as noted above.  Physical Exam    VS:  BP 138/82    Pulse 71    Ht 6' 1.5" (1.867 m)    Wt 208 lb (94.3 kg)    SpO2 97%    BMI 27.07 kg/m  , BMI Body mass index is 27.07 kg/m. GEN: Well nourished, well developed, in no acute distress. HEENT: normal. Neck: Supple, no JVD, carotid bruits, or masses. Cardiac: RRR, no murmurs, rubs, or gallops. No clubbing, cyanosis, edema.  Radials/DP/PT 2+ and equal bilaterally.  Respiratory:  Respirations regular and unlabored, clear to auscultation bilaterally. GI: Soft, nontender, nondistended, BS + x 4. MS: no deformity or atrophy. Skin: warm and dry, no rash. Neuro:  Strength and sensation are intact. Psych: Normal affect.  Accessory Clinical Findings    Recent Labs: No results found for requested labs within last 8760 hours.   Recent Lipid Panel    Component Value Date/Time   CHOL 150 08/11/2020 0815   TRIG 214 (H) 08/11/2020 0815   HDL 40 08/11/2020 0815   CHOLHDL 3.8 08/11/2020 0815   CHOLHDL 4.8 12/16/2016 0924   VLDL 35 (H) 12/16/2016 0924   LDLCALC 74 08/11/2020 0815    ECG personally reviewed by me today-normal sinus rhythm left axis deviation inferior infarct undetermined age 73 bpm- No acute changes  Echocardiogram 01/02/2013 Study Conclusions   - Left ventricle: The cavity size was normal. Wall thickness    was normal. Systolic function was normal. The estimated    ejection fraction was in the range of 55% to 60%. Mild    hypokinesis of the mid-distallateral myocardium. Left    ventricular diastolic function parameters were normal.  - Left atrium: The atrium was mildly dilated.  Transthoracic echocardiography.  M-mode, complete 2D,  spectral Doppler, and color  Doppler.  Height:  Height:  182.9cm. Height: 72in.  Weight:  Weight: 104.1kg. Weight:  229lb.  Body mass index:  BMI: 31.1kg/m^2.  Body surface  area:    BSA: 2.50m^2.  Blood pressure:     125/72.  Patient  status:  Inpatient.  Location:  Bedside.   ------------------------------------------------------------   ------------------------------------------------------------  Left ventricle:  The cavity size was normal. Wall thickness  was normal. Systolic function was normal. The estimated  ejection fraction was in the range of 55% to 60%.  Regional  wall motion abnormalities:   Mild hypokinesis of the  mid-distallateral myocardium. The transmitral flow pattern  was normal. The deceleration time of the early transmitral  flow velocity was normal. The pulmonary vein flow pattern  was normal. The tissue Doppler parameters were normal. Left  ventricular diastolic function parameters were normal.   ------------------------------------------------------------  Aortic valve:   Trileaflet; mildly thickened, mildly  calcified leaflets. Sclerosis without stenosis.  Doppler:  No significant regurgitation.   ------------------------------------------------------------  Mitral valve:   Structurally normal valve.   Leaflet  separation was normal.  Doppler:  Transvalvular velocity was  within the normal range. There was no evidence for stenosis.   No regurgitation.    Peak gradient: 18mm Hg (D).   ------------------------------------------------------------  Left atrium:  The atrium was mildly dilated.   ------------------------------------------------------------  Right ventricle:  The cavity size was normal. Wall thickness  was normal. Systolic function was normal.   ------------------------------------------------------------  Pulmonic valve:   Poorly visualized.   ------------------------------------------------------------  Tricuspid valve:   Structurally normal valve.   Leaflet   separation was normal.  Doppler:  Transvalvular velocity was  within the normal range.  Trivial regurgitation.   ------------------------------------------------------------  Pulmonary artery:   Systolic pressure was within the normal  range.   ------------------------------------------------------------  Right atrium:  The atrium was normal in size.   ------------------------------------------------------------  Pericardium:  There was no pericardial effusion.   ------------------------------------------------------------  Systemic veins:  Inferior vena cava: The vessel was normal in size; the  respirophasic diameter changes were in the normal range (=  50%); findings are consistent with normal central venous  pressure.    Assessment & Plan   1.  Coronary artery disease-no recent episodes of arm neck back or chest discomfort.  He had NSTEMI 1/14 and then underwent CABG x2 with RIMA-OM and LIMA-LAD by Dr. Roxy Manns. Continue aspirin, ezetimibe, lisinopril, metoprolol, Lovaza, Repatha Heart healthy low-sodium diet-salty 6 given Increase physical activity as tolerated Order CBC-lab slip for [facility] given.  Hyperlipidemia-LDL 74 on 08/11/2020 Continue aspirin, ezetimibe, Lovaza, Repatha Heart healthy low-sodium high-fiber diet Increase physical activity as tolerated Repeat fasting lipids and LFT's-lab slip for [facility] given.  Essential hypertension-BP today 138/82.  Well-controlled at home. Continue metoprolol, lisinopril Heart healthy low-sodium diet Increase physical activity as tolerated Order BMP-lab slip for [facility] given.  Tobacco use-continues to smoke around 1 pack/day.  Smoking cessation encouraged. Smoking cessation information given  Disposition: Follow-up with Dr. Claiborne Billings or me 1 year.   Jossie Ng. Brendyn Mclaren NP-C    01/21/2022, 3:44 PM [address] Office [phone] Fax [phone]  Notice: This dictation was  prepared with Dragon dictation along with smaller phrase technology. Any transcriptional errors that result from this process are unintentional and may not be corrected upon review.  I spent 14 minutes examining this patient, reviewing medications, and using patient centered shared decision making involving her cardiac care.  Prior to her visit I spent greater than 20 minutes reviewing her past medical history,  medications, and prior cardiac tests.

## 2022-01-21 ENCOUNTER — Encounter: Payer: Self-pay | Admitting: General Practice

## 2022-01-21 ENCOUNTER — Other Ambulatory Visit: Payer: Self-pay

## 2022-01-21 ENCOUNTER — Ambulatory Visit (INDEPENDENT_AMBULATORY_CARE_PROVIDER_SITE_OTHER): Payer: Medicare PPO | Admitting: General Practice

## 2022-01-21 VITALS — BP 138/82 | HR 71 | Ht 73.5 in | Wt 208.0 lb

## 2022-01-21 DIAGNOSIS — E785 Hyperlipidemia, unspecified: Secondary | ICD-10-CM

## 2022-01-21 DIAGNOSIS — I10 Essential (primary) hypertension: Secondary | ICD-10-CM | POA: Diagnosis not present

## 2022-01-21 DIAGNOSIS — Z720 Tobacco use: Secondary | ICD-10-CM | POA: Diagnosis not present

## 2022-01-21 DIAGNOSIS — Z79899 Other long term (current) drug therapy: Secondary | ICD-10-CM

## 2022-01-21 DIAGNOSIS — I25810 Atherosclerosis of coronary artery bypass graft(s) without angina pectoris: Secondary | ICD-10-CM

## 2022-01-21 NOTE — Patient Instructions (Signed)
Medication Instructions:  The current medical regimen is effective;  continue present plan and medications as directed. Please refer to the Current Medication list given to you today.  *If you need a refill on your cardiac medications before your next appointment, please call your pharmacy*  Lab Work: FASTING LIPID,LFT,CBC AND BMET IN 1-2 WEEKS If you have labs (blood work) drawn today and your tests are completely normal, you will receive your results only by:  MyChart Message (if you have MyChart) OR A paper copy in the mail.  If you have any lab test that is abnormal or we need to change your treatment, we will call you to review the results. You may go to any [facility] that is convenient for you however, we do have a lab in our office that is able to assist you. You DO NOT need an appointment for our lab. The lab is open 8:00am and closes at 4:00pm. Lunch 12:45 - 1:45pm.  Special Instructions PLEASE READ AND FOLLOW SALTY 6-ATTACHED-1,800 mg daily  PLEASE READ AND FOLLOW SMOKING CESSATION TIPS-ATTACHED  Follow-Up: Your next appointment:  12 month(s) In Person with Nicki Guadalajara, MD   Please call our office 2 months in advance to schedule this appointment.1  At [facility], you and your health needs are our priority.  As part of our continuing mission to provide you with exceptional heart care, we have created designated Provider Care Teams.  These Care Teams include your primary Cardiologist (physician) and Advanced Practice Providers (APPs -  Physician Assistants and Nurse Practitioners) who all work together to provide you with the care you need, when you need it.  We recommend signing up for the patient portal called "MyChart".  Sign up information is provided on this After Visit Summary.  MyChart is used to connect with patients for Virtual Visits (Telemedicine).  Patients are able to view lab/test results, encounter notes, upcoming appointments, etc.  Non-urgent messages can be sent to  your provider as well.   To learn more about what you can do with MyChart, go to [email or web address].

## 2022-02-21 ENCOUNTER — Other Ambulatory Visit: Payer: Self-pay | Admitting: Cardiovascular Disease

## 2022-03-29 LAB — LIPID PANEL

## 2022-03-30 ENCOUNTER — Telehealth: Payer: Self-pay | Admitting: Cardiovascular Disease

## 2022-03-30 LAB — HEPATIC FUNCTION PANEL
ALT: 26 IU/L (ref 0–44)
AST: 24 IU/L (ref 0–40)
Albumin: 4.5 g/dL (ref 3.8–4.8)
Alkaline Phosphatase: 75 IU/L (ref 44–121)
Bilirubin Total: 0.6 mg/dL (ref 0.0–1.2)
Bilirubin, Direct: 0.15 mg/dL (ref 0.00–0.40)
Total Protein: 6.8 g/dL (ref 6.0–8.5)

## 2022-03-30 LAB — CBC
Hematocrit: 49.9 % (ref 37.5–51.0)
Hemoglobin: 17.4 g/dL (ref 13.0–17.7)
MCH: 33 pg (ref 26.6–33.0)
MCHC: 34.9 g/dL (ref 31.5–35.7)
MCV: 95 fL (ref 79–97)
Platelets: 215 10*3/uL (ref 150–450)
RBC: 5.28 x10E6/uL (ref 4.14–5.80)
RDW: 12.3 % (ref 11.6–15.4)
WBC: 6.4 10*3/uL (ref 3.4–10.8)

## 2022-03-30 LAB — BASIC METABOLIC PANEL
BUN/Creatinine Ratio: 18 (ref 10–24)
BUN: 19 mg/dL (ref 8–27)
CO2: 22 mmol/L (ref 20–29)
Calcium: 9.1 mg/dL (ref 8.6–10.2)
Chloride: 100 mmol/L (ref 96–106)
Creatinine, Ser: 1.08 mg/dL (ref 0.76–1.27)
Glucose: 95 mg/dL (ref 70–99)
Potassium: 4 mmol/L (ref 3.5–5.2)
Sodium: 138 mmol/L (ref 134–144)
eGFR: 77 mL/min/{1.73_m2} (ref >59)

## 2022-03-30 LAB — LIPID PANEL
Chol/HDL Ratio: 3.3 ratio (ref 0.0–5.0)
Cholesterol, Total: 130 mg/dL (ref 100–199)
HDL: 40 mg/dL (ref >39)
LDL Chol Calc (NIH): 60 mg/dL (ref 0–99)
Triglycerides: 182 mg/dL — ABNORMAL HIGH (ref 0–149)
VLDL Cholesterol Cal: 30 mg/dL (ref 5–40)

## 2022-03-30 NOTE — Telephone Encounter (Signed)
Pt returning call regarding test results. Please advise ?

## 2022-03-30 NOTE — Telephone Encounter (Signed)
SEE RESULT NOTE 

## 2022-03-31 ENCOUNTER — Other Ambulatory Visit: Payer: Self-pay

## 2022-03-31 DIAGNOSIS — I25810 Atherosclerosis of coronary artery bypass graft(s) without angina pectoris: Secondary | ICD-10-CM

## 2022-03-31 DIAGNOSIS — E7800 Pure hypercholesterolemia, unspecified: Secondary | ICD-10-CM

## 2022-03-31 DIAGNOSIS — Z79899 Other long term (current) drug therapy: Secondary | ICD-10-CM

## 2022-03-31 DIAGNOSIS — E785 Hyperlipidemia, unspecified: Secondary | ICD-10-CM

## 2022-03-31 MED ORDER — FENOFIBRATE 120 MG PO TABS: 120.0000 mg | ORAL_TABLET | Freq: Every day | ORAL | 3 refills | Status: DC

## 2022-05-05 ENCOUNTER — Other Ambulatory Visit: Payer: Self-pay

## 2022-05-05 MED ORDER — REPATHA 140 MG/ML ~~LOC~~ SOSY: PREFILLED_SYRINGE | SUBCUTANEOUS | 11 refills | Status: DC

## 2022-06-15 ENCOUNTER — Telehealth: Payer: Self-pay | Admitting: Cardiovascular Disease

## 2022-06-15 NOTE — Telephone Encounter (Signed)
Patient informed and verbalized understanding of plan. Will forward to Dr. Landry Dyke nurse for assistance

## 2022-06-15 NOTE — Telephone Encounter (Signed)
Pt c/o medication issue:  1. Name of Medication: Evolocumab (REPATHA) 140 MG/ML SOSY  2. How are you currently taking this medication (dosage and times per day)? Not taking it currently  3. Are you having a reaction (difficulty breathing--STAT)?   4. What is your medication issue? Pt is calling in regards to medication price. He states [employer] card expired a couple of months ago and he is unable to afford it. He would like to know if there are other options for this medication or if he even needs this still. States he has been off of it for a couple of months now. Please advise.

## 2022-06-15 NOTE — Telephone Encounter (Signed)
If his grant has expired, the next step for him would be to apply for patient assistance through the [employer]

## 2022-06-20 ENCOUNTER — Telehealth: Payer: Self-pay | Admitting: Cardiovascular Disease

## 2022-06-21 NOTE — Telephone Encounter (Signed)
Informed patient I will mail application to him for patient assistance from [employer] for his Repatha.

## 2022-06-24 NOTE — Telephone Encounter (Signed)
Informed patient . It may take up 5 to 10 business days to receive  the patient assistance form in the mail.  If patient has not received drom by next Thursday -06/30/22.  Patient is aware the form has to brought to the office either by mail or physical brought to the office. Patient voiced understanding.

## 2022-06-24 NOTE — Telephone Encounter (Signed)
Pt calling back stating that he hasn't received form in the mail yet.

## 2022-06-26 ENCOUNTER — Other Ambulatory Visit: Payer: Self-pay | Admitting: Cardiovascular Disease

## 2022-08-05 ENCOUNTER — Other Ambulatory Visit: Payer: Self-pay | Admitting: Cardiovascular Disease

## 2023-01-16 ENCOUNTER — Other Ambulatory Visit (HOSPITAL_COMMUNITY): Payer: Self-pay

## 2023-02-10 ENCOUNTER — Telehealth: Payer: Self-pay | Admitting: Cardiovascular Disease

## 2023-02-10 NOTE — Telephone Encounter (Signed)
*  STAT* If patient is at the pharmacy, call can be transferred to refill team.   1. Which medications need to be refilled? (please list name of each medication and dose if known) Evolocumab (REPATHA) 140 MG/ML SOSY   2. Which pharmacy/location (including street and city if local pharmacy) is medication to be sent to?[place], [place]   3. Do they need a 30 day or 90 day supply? [place]

## 2023-02-13 ENCOUNTER — Other Ambulatory Visit: Payer: Self-pay

## 2023-02-13 DIAGNOSIS — E7800 Pure hypercholesterolemia, unspecified: Secondary | ICD-10-CM

## 2023-02-13 DIAGNOSIS — I2510 Atherosclerotic heart disease of native coronary artery without angina pectoris: Secondary | ICD-10-CM

## 2023-02-13 DIAGNOSIS — I214 Non-ST elevation (NSTEMI) myocardial infarction: Secondary | ICD-10-CM

## 2023-02-13 MED ORDER — REPATHA SURECLICK 140 MG/ML ~~LOC~~ SOAJ: 1.0000 mL | SUBCUTANEOUS | 3 refills | Status: DC

## 2023-02-14 MED ORDER — REPATHA SURECLICK 140 MG/ML ~~LOC~~ SOAJ
1.0000 | SUBCUTANEOUS | 11 refills | Status: AC
Start: 2023-02-14 — End: ?

## 2023-02-14 NOTE — Telephone Encounter (Signed)
Prescription was sent to the wrong pharmacy. Please transfer to patient's local pharmacy, listed below.   *STAT* If patient is at the pharmacy, call can be transferred to refill team.   1. Which medications need to be refilled? (please list name of each medication and dose if known) Evolocumab (REPATHA SURECLICK) [id number] MG/ML SOAJ  2. Which pharmacy/location (including street and city if local pharmacy) is medication to be sent to? [place], [place]  3. Do they need a 30 day or 90 day supply?

## 2023-02-14 NOTE — Telephone Encounter (Signed)
Refill had been sent to mail order pharmacy yesterday. I have refilled to his local pharmacy.

## 2023-04-05 NOTE — Progress Notes (Deleted)
Cardiology Clinic Note   Patient Name: Juan PlowmanDaniel Gates Date of Encounter: 04/05/2023  Primary Care Provider:  Fleet ContrasAvbuere, Edwin, MD Primary Cardiologist:  Nicki Guadalajarahomas Kelly, MD  Patient Profile    Juan Gates 67 year old male presents to the clinic today for follow-up evaluation of his coronary artery disease and hyperlipidemia.  Past Medical History    Past Medical History:  Diagnosis Date   Anxiety    Arthritis    knees, right hip   Complication of anesthesia    "started fighting when woke up with tube down throat"   Coronary artery disease    Hyperlipidemia    Hypertension    Myocardial infarction [facility](HCC) Jan. 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   LIMA to LAD, RIMA to OM1   Past Surgical History:  Procedure Laterality Date   CARDIAC CATHETERIZATION  12/31/2012   2 vessel cors 90%hazy stenosis prox circ with long segment 60-70%LAD,mod RCA,severe lesion small marginal branch   CORONARY ARTERY BYPASS GRAFT  01/03/2013   Procedure: CORONARY ARTERY BYPASS GRAFTING (CABG);  Surgeon: Purcell Nailslarence H Owen, MD;  [facility];  Service: Open Heart Surgery;  Laterality: N/A;   HERNIA REPAIR  07/22/13   open RIH   INGUINAL HERNIA REPAIR Left 07/22/2013   Procedure: OPEN LEFT INGUINAL HERNIA REPAIR ;  Surgeon: Mariella SaaBenjamin T Hoxworth, MD;  Location: [facility] ORS;  Service: General;  Laterality: Left;   INSERTION OF MESH Left 07/22/2013   Procedure: INSERTION OF MESH;  Surgeon: Mariella SaaBenjamin T Hoxworth, MD;  Location: [facility] ORS;  Service: General;  Laterality: Left;   INTRAOPERATIVE TRANSESOPHAGEAL ECHOCARDIOGRAM  01/03/2013   Procedure: INTRAOPERATIVE TRANSESOPHAGEAL ECHOCARDIOGRAM;  Surgeon: Purcell Nailslarence H Owen, MD;  Location: [facility] OR;  Service: Open Heart Surgery;  Laterality: N/A;   LEFT HEART CATHETERIZATION WITH CORONARY ANGIOGRAM N/A 01/01/2013   Procedure: LEFT HEART CATHETERIZATION WITH CORONARY ANGIOGRAM;  Surgeon: Marykay Lexavid W Harding, MD;  [facility];  Service: Cardiovascular;  Laterality: N/A;     Allergies  Allergies  Allergen Reactions   Atorvastatin     myalgias   Rosuvastatin     myalgia    History of Present Illness    Juan PlowmanDaniel Gates has a PMH of coronary artery disease, NSTEMI 12/31/12, status post CABG x2 LIMA-LAD and RIMA-OM 01/03/2013, tobacco abuse, essential hypertension, and hyperlipidemia.  He was noted to have preserved LV function post CABG.  He remarried 9/17.  He was placed on disability after his MRI January 2014.  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  He was seen by Dr. Tresa EndoKelly 5/19 and denied chest pain shortness of breath.  He was not exercising regularly.  He had undergone lab tests 12/17 while he was on atorvastatin, omega-3's, and fenofibrate.  His LDL cholesterol was 91 and his triglycerides were 176.  Zetia was added to his medication regimen and he was taken off fenofibrate.  He reported that he was often needing at fast food restaurants.  He was working on changing his diet.  He has lost around 7 pounds in 2 weeks.  He was reducing his carbohydrates and avoiding fast food.  He had follow-up lab work by his PCP in February which showed a cholesterol of 193, LDL of 126 and triglycerides of 135.  He continued to smoke at that time.  He was seen in follow-up by Azalee CourseHao Meng PA-C 4/21 and remained stable from a cardiac standpoint.  He was seen by Dr. Tresa EndoKelly 10/19/2020.  He continued to be on disability.  He was active doing yard work.  He did not exercise regularly.  He continues to smoke about a pack per day.  His lisinopril and metoprolol were continued.  He denied chest pain and irregular heartbeats.  He denied exertional dyspnea.  He reported compliance with his Repatha.  His lipid panel 8/21 showed an LDL cholesterol of 74 and a total cholesterol of 150.  His blood pressure was well controlled.  He presents to the clinic today for follow-up evaluation states he feels well.  He continues to be on disability.  He reports that he is walking most days of the  week for about 30 minutes and also does push-ups in the morning.  His blood pressure continues to be well controlled.  In the clinic today it is 138/82.  He continues to smoke a pack per day or less.  We reviewed the importance of smoking cessation.  Due to the distance between where he currently lives in the clinic we will provide him with lab slips for [facility].  I will give him a salty 6 diet sheet, smoking cessation information, continue his current medication regimen, and plan follow-up for 1 week.  Today he denies chest pain, shortness of breath, lower extremity edema, fatigue, palpitations, melena, hematuria, hemoptysis, diaphoresis, weakness, presyncope, syncope, orthopnea, and PND.   Home Medications    Prior to Admission medications   Medication Sig Start Date End Date Taking? Authorizing Provider  aspirin 81 MG tablet Take 81 mg by mouth daily.    [provider]  cephALEXin (KEFLEX) 500 MG capsule Take 1 capsule (500 mg total) by mouth 4 (four) times daily. 12/16/20   Cheryll Cockayne, MD  Evolocumab (REPATHA) 140 MG/ML SOSY INJECT 140 MG INTO THE SKIN EVERY 14 DAYS 05/25/21   Lennette Bihari, MD  ezetimibe (ZETIA) 10 MG tablet TAKE 1 TABLET EVERY DAY 12/10/20   Lennette Bihari, MD  HYDROcodone-acetaminophen (NORCO/VICODIN) 5-325 MG tablet Take 1 tablet by mouth every 6 (six) hours as needed for moderate pain.    [provider]  hydrOXYzine (ATARAX/VISTARIL) 25 MG tablet Take 25 mg by mouth at bedtime. 04/27/18   [provider]  lisinopril (ZESTRIL) 5 MG tablet TAKE 1 TABLET EVERY DAY. PLEASE SCHEDULE AN APPOINTMENT FOR FUTURE REFILLS. 05/25/21   Lennette Bihari, MD  metoprolol tartrate (LOPRESSOR) 50 MG tablet TAKE 1 AND 1/2 TABLETS TWICE DAILY. 06/04/21   Lennette Bihari, MD  naproxen (NAPROSYN) 500 MG tablet Take 500 mg by mouth daily as needed for mild pain.  12/24/19   [provider]  omega-3 acid ethyl esters (LOVAZA) 1 g capsule Take 2 capsules (2 g  total) by mouth 2 (two) times daily. Patient taking differently: Take 2-3 g by mouth See admin instructions. Patient takes 1 tablet in the morning and 2 tablets at bedtime 01/02/20   Lennette Bihari, MD  QUEtiapine (SEROQUEL XR) 400 MG 24 hr tablet Take 1 tablet by mouth at bedtime. 11/15/16   [provider]  traZODone (DESYREL) 100 MG tablet Take 1 tablet by mouth at bedtime. 09/09/16   [provider]    Family History    Family History  Problem Relation Age of Onset   Hypertension Mother    Cancer Father        bone cancer   Cancer Brother        metastatic melanoma   He indicated that his mother is deceased. He indicated that his father is deceased. He  indicated that the status of his brother is unknown.  Social History    Social History   Socioeconomic History   Marital status: Married    Spouse name: Not on file   Number of children: Not on file   Years of education: Not on file   Highest education level: Not on file  Occupational History   Not on file  Tobacco Use   Smoking status: Every Day    Packs/day: 1.00    Years: 20.00    Additional pack years: 0.00    Total pack years: 20.00    Types: Cigarettes   Smokeless tobacco: Never  Substance and Sexual Activity   Alcohol use: Yes    Comment: beers "every now and then"   Drug use: No   Sexual activity: Not on file  Other Topics Concern   Not on file  Social History Narrative   Not on file   Social Determinants of Health   Financial Resource Strain: Not on file  Food Insecurity: Not on file  Transportation Needs: Not on file  Physical Activity: Not on file  Stress: Not on file  Social Connections: Not on file  Intimate Partner Violence: Not on file     Review of Systems    General:  No chills, fever, night sweats or weight changes.  Cardiovascular:  No chest pain, dyspnea on exertion, edema, orthopnea, palpitations, paroxysmal nocturnal dyspnea. Dermatological: No rash,  lesions/masses Respiratory: No cough, dyspnea Urologic: No hematuria, dysuria Abdominal:   No nausea, vomiting, diarrhea, bright red blood per rectum, melena, or hematemesis Neurologic:  No visual changes, wkns, changes in mental status. All other systems reviewed and are otherwise negative except as noted above.  Physical Exam    VS:  There were no vitals taken for this visit. , BMI There is no height or weight on file to calculate BMI. GEN: Well nourished, well developed, in no acute distress. HEENT: normal. Neck: Supple, no JVD, carotid bruits, or masses. Cardiac: RRR, no murmurs, rubs, or gallops. No clubbing, cyanosis, edema.  Radials/DP/PT 2+ and equal bilaterally.  Respiratory:  Respirations regular and unlabored, clear to auscultation bilaterally. GI: Soft, nontender, nondistended, BS + x 4. MS: no deformity or atrophy. Skin: warm and dry, no rash. Neuro:  Strength and sensation are intact. Psych: Normal affect.  Accessory Clinical Findings    Recent Labs: No results found for requested labs within last 365 days.   Recent Lipid Panel    Component Value Date/Time   CHOL 130 03/29/2022 0948   TRIG 182 (H) 03/29/2022 0948   HDL 40 03/29/2022 0948   CHOLHDL 3.3 03/29/2022 0948   CHOLHDL 4.8 12/16/2016 0924   VLDL 35 (H) 12/16/2016 0924   LDLCALC 60 03/29/2022 0948    ECG personally reviewed by me today-normal sinus rhythm left axis deviation inferior infarct undetermined age 62 bpm- No acute changes  Echocardiogram 01/02/2013 Study Conclusions   - Left ventricle: The cavity size was normal. Wall thickness    was normal. Systolic function was normal. The estimated    ejection fraction was in the range of 55% to 60%. Mild    hypokinesis of the mid-distallateral myocardium. Left    ventricular diastolic function parameters were normal.  - Left atrium: The atrium was mildly dilated.  Transthoracic echocardiography.  M-mode, complete 2D,  spectral Doppler, and color  Doppler.  Height:  Height:  182.9cm. Height: 72in.  Weight:  Weight: 104.1kg. Weight:  229lb.  Body mass index:  BMI:  31.1kg/m^2.  Body surface  area:    BSA: 2.31m^2.  Blood pressure:     125/72.  Patient  status:  Inpatient.  Location:  Bedside.   ------------------------------------------------------------   ------------------------------------------------------------  Left ventricle:  The cavity size was normal. Wall thickness  was normal. Systolic function was normal. The estimated  ejection fraction was in the range of 55% to 60%.  Regional  wall motion abnormalities:   Mild hypokinesis of the  mid-distallateral myocardium. The transmitral flow pattern  was normal. The deceleration time of the early transmitral  flow velocity was normal. The pulmonary vein flow pattern  was normal. The tissue Doppler parameters were normal. Left  ventricular diastolic function parameters were normal.   ------------------------------------------------------------  Aortic valve:   Trileaflet; mildly thickened, mildly  calcified leaflets. Sclerosis without stenosis.  Doppler:  No significant regurgitation.   ------------------------------------------------------------  Mitral valve:   Structurally normal valve.   Leaflet  separation was normal.  Doppler:  Transvalvular velocity was  within the normal range. There was no evidence for stenosis.   No regurgitation.    Peak gradient: 3mm Hg (D).   ------------------------------------------------------------  Left atrium:  The atrium was mildly dilated.   ------------------------------------------------------------  Right ventricle:  The cavity size was normal. Wall thickness  was normal. Systolic function was normal.   ------------------------------------------------------------  Pulmonic valve:   Poorly visualized.   ------------------------------------------------------------  Tricuspid valve:   Structurally normal valve.   Leaflet   separation was normal.  Doppler:  Transvalvular velocity was  within the normal range.  Trivial regurgitation.   ------------------------------------------------------------  Pulmonary artery:   Systolic pressure was within the normal  range.   ------------------------------------------------------------  Right atrium:  The atrium was normal in size.   ------------------------------------------------------------  Pericardium:  There was no pericardial effusion.   ------------------------------------------------------------  Systemic veins:  Inferior vena cava: The vessel was normal in size; the  respirophasic diameter changes were in the normal range (=  50%); findings are consistent with normal central venous  pressure.    Assessment & Plan   1.  Coronary artery disease-no recent episodes of arm neck back or chest discomfort.  He had NSTEMI 1/14 and then underwent CABG x2 with RIMA-OM and LIMA-LAD by Dr. Cornelius Moras. Continue aspirin, ezetimibe, lisinopril, metoprolol, Lovaza, Repatha Heart healthy low-sodium diet-salty 6 given Increase physical activity as tolerated Order CBC-lab slip for [facility] given.  Hyperlipidemia-LDL 74 on 08/11/2020 Continue aspirin, ezetimibe, Lovaza, Repatha Heart healthy low-sodium high-fiber diet Increase physical activity as tolerated Repeat fasting lipids and LFT's-lab slip for [facility] given.  Essential hypertension-BP today 138/82.  Well-controlled at home. Continue metoprolol, lisinopril Heart healthy low-sodium diet Increase physical activity as tolerated Order BMP-lab slip for [facility] given.  Tobacco use-continues to smoke around 1 pack/day.  Smoking cessation encouraged. Smoking cessation information given  Disposition: Follow-up with Dr. Tresa Endo or me 1 year.   Thomasene Ripple. Celia Gibbons NP-C    04/05/2023, 1:32 PM [facility] [address] Office [phone] Fax [phone]  Notice: This dictation was  prepared with Dragon dictation along with smaller phrase technology. Any transcriptional errors that result from this process are unintentional and may not be corrected upon review.  I spent 14 minutes examining this patient, reviewing medications, and using patient centered shared decision making involving her cardiac care.  Prior to her visit I spent greater than 20 minutes reviewing her past medical history,  medications, and prior cardiac tests.

## 2023-04-06 ENCOUNTER — Ambulatory Visit: Payer: Medicare PPO | Admitting: General Practice

## 2023-04-10 ENCOUNTER — Encounter: Payer: Self-pay | Admitting: *Deleted

## 2023-05-16 NOTE — Progress Notes (Deleted)
Cardiology Clinic Note   Patient Name: Juan Gates Date of Encounter: 05/16/2023  Primary Care Provider:  Fleet Contras, MD Primary Cardiologist:  Nicki Guadalajara, MD  Patient Profile    Juan Gates 67 year old male presents to the clinic today for follow-up evaluation of his coronary artery disease and hyperlipidemia.  Past Medical History    Past Medical History:  Diagnosis Date   Anxiety    Arthritis    knees, right hip   Complication of anesthesia    "started fighting when woke up with tube down throat"   Coronary artery disease    Hyperlipidemia    Hypertension    Myocardial infarction [facility]) Jan. 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   LIMA to LAD, RIMA to OM1   Past Surgical History:  Procedure Laterality Date   CARDIAC CATHETERIZATION  12/31/2012   2 vessel cors 90%hazy stenosis prox circ with long segment 60-70%LAD,mod RCA,severe lesion small marginal branch   CORONARY ARTERY BYPASS GRAFT  01/03/2013   Procedure: CORONARY ARTERY BYPASS GRAFTING (CABG);  Surgeon: Purcell Nails, MD;  Location: [facility] OR;  Service: Open Heart Surgery;  Laterality: N/A;   HERNIA REPAIR  07/22/13   open RIH   INGUINAL HERNIA REPAIR Left 07/22/2013   Procedure: OPEN LEFT INGUINAL HERNIA REPAIR ;  Surgeon: Mariella Saa, MD;  Location: [facility] ORS;  Service: General;  Laterality: Left;   INSERTION OF MESH Left 07/22/2013   Procedure: INSERTION OF MESH;  Surgeon: Mariella Saa, MD;  Location: [facility] ORS;  Service: General;  Laterality: Left;   INTRAOPERATIVE TRANSESOPHAGEAL ECHOCARDIOGRAM  01/03/2013   Procedure: INTRAOPERATIVE TRANSESOPHAGEAL ECHOCARDIOGRAM;  Surgeon: Purcell Nails, MD;  Location: [facility] OR;  Service: Open Heart Surgery;  Laterality: N/A;   LEFT HEART CATHETERIZATION WITH CORONARY ANGIOGRAM N/A 01/01/2013   Procedure: LEFT HEART CATHETERIZATION WITH CORONARY ANGIOGRAM;  Surgeon: [name], MD;  Location: [facility] CATH LAB;  Service: Cardiovascular;  Laterality: N/A;     Allergies  Allergies  Allergen Reactions   Atorvastatin     myalgias   Rosuvastatin     myalgia    History of Present Illness    Juan Gates has a PMH of coronary artery disease, NSTEMI 12/31/12, status post CABG x2 LIMA-LAD and RIMA-OM 01/03/2013, tobacco abuse, essential hypertension, and hyperlipidemia.  He was noted to have preserved LV function post CABG.  He remarried 9/17.  He was placed on disability after his MRI January 2014.  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  He was seen by Dr. Tresa Endo 5/19 and denied chest pain shortness of breath.  He was not exercising regularly.  He had undergone lab tests 12/17 while he was on atorvastatin, omega-3's, and fenofibrate.  His LDL cholesterol was 91 and his triglycerides were 176.  Zetia was added to his medication regimen and he was taken off fenofibrate.  He reported that he was often needing at fast food restaurants.  He was working on changing his diet.  He has lost around 7 pounds in 2 weeks.  He was reducing his carbohydrates and avoiding fast food.  He had follow-up lab work by his PCP in February which showed a cholesterol of 193, LDL of 126 and triglycerides of 135.  He continued to smoke at that time.  He was seen in follow-up by Azalee Course PA-C 4/21 and remained stable from a cardiac standpoint.  He was seen by Dr. Tresa Endo 10/19/2020.  He continued to be on disability.  He was active doing yard work.  He did not exercise regularly.  He continues to smoke about a pack per day.  His lisinopril and metoprolol were continued.  He denied chest pain and irregular heartbeats.  He denied exertional dyspnea.  He reported compliance with his Repatha.  His lipid panel 8/21 showed an LDL cholesterol of 74 and a total cholesterol of 150.  His blood pressure was well controlled.  He presented to the clinic 01/21/2022 for follow-up evaluation stated he felt well.  He continued to be on disability.  He reported that he was walking most days  of the week for about 30 minutes and also did push-ups in the morning.  His blood pressure continued to be well controlled.  In the clinic  it was 138/82.  He continued to smoke a pack per day or less.  We reviewed the importance of smoking cessation.  Due to the distance between where he currently lived in the clinic we will provide him with lab slips for [facility].  I gave him a salty 6 diet sheet, smoking cessation information, continued his current medication regimen, and planned follow-up for 1 yr.  He presents to the clinic today for follow-up evaluation and states***.  Today he denies chest pain, shortness of breath, lower extremity edema, fatigue, palpitations, melena, hematuria, hemoptysis, diaphoresis, weakness, presyncope, syncope, orthopnea, and PND.   Home Medications    Prior to Admission medications   Medication Sig Start Date End Date Taking? Authorizing Provider  aspirin 81 MG tablet Take 81 mg by mouth daily.    [provider]  cephALEXin (KEFLEX) 500 MG capsule Take 1 capsule (500 mg total) by mouth 4 (four) times daily. 12/16/20   Cheryll Cockayne, MD  Evolocumab (REPATHA) 140 MG/ML SOSY INJECT 140 MG INTO THE SKIN EVERY 14 DAYS 05/25/21   Lennette Bihari, MD  ezetimibe (ZETIA) 10 MG tablet TAKE 1 TABLET EVERY DAY 12/10/20   Lennette Bihari, MD  HYDROcodone-acetaminophen (NORCO/VICODIN) 5-325 MG tablet Take 1 tablet by mouth every 6 (six) hours as needed for moderate pain.    [provider]  hydrOXYzine (ATARAX/VISTARIL) 25 MG tablet Take 25 mg by mouth at bedtime. 04/27/18   [provider]  lisinopril (ZESTRIL) 5 MG tablet TAKE 1 TABLET EVERY DAY. PLEASE SCHEDULE AN APPOINTMENT FOR FUTURE REFILLS. 05/25/21   Lennette Bihari, MD  metoprolol tartrate (LOPRESSOR) 50 MG tablet TAKE 1 AND 1/2 TABLETS TWICE DAILY. 06/04/21   Lennette Bihari, MD  naproxen (NAPROSYN) 500 MG tablet Take 500 mg by mouth daily as needed for mild pain.  12/24/19   [provider]  omega-3 acid ethyl esters (LOVAZA) 1 g capsule Take 2 capsules (2 g total) by mouth 2 (two) times daily. Patient taking differently: Take 2-3 g by mouth See admin instructions. Patient takes 1 tablet in the morning and 2 tablets at bedtime 01/02/20   Lennette Bihari, MD  QUEtiapine (SEROQUEL XR) 400 MG 24 hr tablet Take 1 tablet by mouth at bedtime. 11/15/16   [provider]  traZODone (DESYREL) 100 MG tablet Take 1 tablet by mouth at bedtime. 09/09/16   [provider]    Family History    Family History  Problem Relation Age of Onset   Hypertension Mother    Cancer Father        bone cancer   Cancer Brother        metastatic melanoma   He indicated that his  mother is deceased. He indicated that his father is deceased. He indicated that the status of his brother is unknown.  Social History    Social History   Socioeconomic History   Marital status: Married    Spouse name: Not on file   Number of children: Not on file   Years of education: Not on file   Highest education level: Not on file  Occupational History   Not on file  Tobacco Use   Smoking status: Every Day    Packs/day: 1.00    Years: 20.00    Additional pack years: 0.00    Total pack years: 20.00    Types: Cigarettes   Smokeless tobacco: Never  Substance and Sexual Activity   Alcohol use: Yes    Comment: beers "every now and then"   Drug use: No   Sexual activity: Not on file  Other Topics Concern   Not on file  Social History Narrative   Not on file   Social Determinants of Health   Financial Resource Strain: Not on file  Food Insecurity: Not on file  Transportation Needs: Not on file  Physical Activity: Not on file  Stress: Not on file  Social Connections: Not on file  Intimate Partner Violence: Not on file     Review of Systems    General:  No chills, fever, night sweats or weight changes.  Cardiovascular:  No chest pain, dyspnea on exertion, edema, orthopnea,  palpitations, paroxysmal nocturnal dyspnea. Dermatological: No rash, lesions/masses Respiratory: No cough, dyspnea Urologic: No hematuria, dysuria Abdominal:   No nausea, vomiting, diarrhea, bright red blood per rectum, melena, or hematemesis Neurologic:  No visual changes, wkns, changes in mental status. All other systems reviewed and are otherwise negative except as noted above.  Physical Exam    VS:  There were no vitals taken for this visit. , BMI There is no height or weight on file to calculate BMI. GEN: Well nourished, well developed, in no acute distress. HEENT: normal. Neck: Supple, no JVD, carotid bruits, or masses. Cardiac: RRR, no murmurs, rubs, or gallops. No clubbing, cyanosis, edema.  Radials/DP/PT 2+ and equal bilaterally.  Respiratory:  Respirations regular and unlabored, clear to auscultation bilaterally. GI: Soft, nontender, nondistended, BS + x 4. MS: no deformity or atrophy. Skin: warm and dry, no rash. Neuro:  Strength and sensation are intact. Psych: Normal affect.  Accessory Clinical Findings    Recent Labs: No results found for requested labs within last 365 days.   Recent Lipid Panel    Component Value Date/Time   CHOL 130 03/29/2022 0948   TRIG 182 (H) 03/29/2022 0948   HDL 40 03/29/2022 0948   CHOLHDL 3.3 03/29/2022 0948   CHOLHDL 4.8 12/16/2016 0924   VLDL 35 (H) 12/16/2016 0924   LDLCALC 60 03/29/2022 0948    ECG personally reviewed by me today-***  EKG 01/21/2022 normal sinus rhythm left axis deviation inferior infarct undetermined age 89 bpm- No acute changes  Echocardiogram 01/02/2013 Study Conclusions   - Left ventricle: The cavity size was normal. Wall thickness    was normal. Systolic function was normal. The estimated    ejection fraction was in the range of 55% to 60%. Mild    hypokinesis of the mid-distallateral myocardium. Left    ventricular diastolic function parameters were normal.  - Left atrium: The atrium was mildly  dilated.  Transthoracic echocardiography.  M-mode, complete 2D,  spectral Doppler, and color Doppler.  Height:  Height:  182.9cm. Height:  72in.  Weight:  Weight: 104.1kg. Weight:  229lb.  Body mass index:  BMI: 31.1kg/m^2.  Body surface  area:    BSA: 2.80m^2.  Blood pressure:     125/72.  Patient  status:  Inpatient.  Location:  Bedside.   ------------------------------------------------------------   ------------------------------------------------------------  Left ventricle:  The cavity size was normal. Wall thickness  was normal. Systolic function was normal. The estimated  ejection fraction was in the range of 55% to 60%.  Regional  wall motion abnormalities:   Mild hypokinesis of the  mid-distallateral myocardium. The transmitral flow pattern  was normal. The deceleration time of the early transmitral  flow velocity was normal. The pulmonary vein flow pattern  was normal. The tissue Doppler parameters were normal. Left  ventricular diastolic function parameters were normal.   ------------------------------------------------------------  Aortic valve:   Trileaflet; mildly thickened, mildly  calcified leaflets. Sclerosis without stenosis.  Doppler:  No significant regurgitation.   ------------------------------------------------------------  Mitral valve:   Structurally normal valve.   Leaflet  separation was normal.  Doppler:  Transvalvular velocity was  within the normal range. There was no evidence for stenosis.   No regurgitation.    Peak gradient: 3mm Hg (D).   ------------------------------------------------------------  Left atrium:  The atrium was mildly dilated.   ------------------------------------------------------------  Right ventricle:  The cavity size was normal. Wall thickness  was normal. Systolic function was normal.   ------------------------------------------------------------  Pulmonic valve:   Poorly visualized.    ------------------------------------------------------------  Tricuspid valve:   Structurally normal valve.   Leaflet  separation was normal.  Doppler:  Transvalvular velocity was  within the normal range.  Trivial regurgitation.   ------------------------------------------------------------  Pulmonary artery:   Systolic pressure was within the normal  range.   ------------------------------------------------------------  Right atrium:  The atrium was normal in size.   ------------------------------------------------------------  Pericardium:  There was no pericardial effusion.   ------------------------------------------------------------  Systemic veins:  Inferior vena cava: The vessel was normal in size; the  respirophasic diameter changes were in the normal range (=  50%); findings are consistent with normal central venous  pressure.    Assessment & Plan   1.  Coronary artery disease-***no chest pain today.  He had NSTEMI 1/14 and then underwent CABG x2 with RIMA-OM and LIMA-LAD by Dr. Cornelius Moras. Continue aspirin, ezetimibe, lisinopril, metoprolol, Lovaza, Repatha Heart healthy low-sodium diet-reviewed Maintain physical activity Order CBC, BMP  Essential hypertension-BP today 138/82.  Well-controlled at home. Continue metoprolol, lisinopril Heart healthy low-sodium diet Maintain blood pressure log  Hyperlipidemia-LDL 60 on 03/29/2022 Continue aspirin, ezetimibe, Lovaza, Repatha High-fiber diet Repeat fasting lipids and LFT's  Tobacco use-continues to smoke***.  Smoking cessation encouraged. Smoking cessation information given  Disposition: Follow-up with Dr. Tresa Endo or me 1 year.   Thomasene Ripple. Juan Canino NP-C    05/16/2023, 7:29 AM [facility] [address] Office [phone] Fax [phone]  Notice: This dictation was prepared with Dragon dictation along with smaller phrase technology. Any transcriptional errors that result from  this process are unintentional and may not be corrected upon review.  I spent 14***minutes examining this patient, reviewing medications, and using patient centered shared decision making involving her cardiac care.  Prior to her visit I spent greater than 20 minutes reviewing her past medical history,  medications, and prior cardiac tests.

## 2023-05-17 ENCOUNTER — Ambulatory Visit: Payer: Medicare PPO | Admitting: General Practice

## 2023-06-03 ENCOUNTER — Telehealth: Payer: Self-pay

## 2023-06-03 NOTE — Telephone Encounter (Signed)
Pharmacy Patient Advocate Encounter  Prior Authorization for REPATHA 140 has been APPROVED by [facility] from 1.1.24 to 12.31.24.   SEE APPROVAL IN PTS MEDIA

## 2023-06-15 NOTE — Progress Notes (Deleted)
Cardiology Office Note:    Date:  06/15/2023   ID:  Juan Gates, DOB June 09, 1956, MRN [id number]  PCP:  Fleet Contras, MD   [facility] Providers Cardiologist:  Nicki Guadalajara, MD { Click to update primary MD,subspecialty MD or APP then REFRESH:1}    Referring MD: Fleet Contras, MD   No chief complaint on file. ***  History of Present Illness:    Juan Gates is a 67 y.o. male with a hx of CAD s/p CABG x 2 (LIMA-LAD, RIMA-OM 2014, following NSTEMI), HLD, HTN, and history of tobacco use. HLD managed with statin, fenofibrate, and omega-3 fatty acid. He has since started repatha.  I saw him in 2020. Last seen by Edd Fabian NP 12/2021.  He presents for overdue annual follow up.    CAD s/p CABG x 2 (2014) No recent surveillance stress test   Hyperlipidemia with LDL goal < 55 On repatha, zetia, fenofibrate, lovaza Needs updated lipid panel   Hypertension 5 mg lisinopril, 75 mg lopressor BID   Ongoing tobacco use       Past Medical History:  Diagnosis Date   Anxiety    Arthritis    knees, right hip   Complication of anesthesia    "started fighting when woke up with tube down throat"   Coronary artery disease    Hyperlipidemia    Hypertension    Myocardial infarction [facility]) Jan. 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   LIMA to LAD, RIMA to OM1    Past Surgical History:  Procedure Laterality Date   CARDIAC CATHETERIZATION  12/31/2012   2 vessel cors 90%hazy stenosis prox circ with long segment 60-70%LAD,mod RCA,severe lesion small marginal branch   CORONARY ARTERY BYPASS GRAFT  01/03/2013   Procedure: CORONARY ARTERY BYPASS GRAFTING (CABG);  Surgeon: Purcell Nails, MD;  Location: [facility] OR;  Service: Open Heart Surgery;  Laterality: N/A;   HERNIA REPAIR  07/22/13   open RIH   INGUINAL HERNIA REPAIR Left 07/22/2013   Procedure: OPEN LEFT INGUINAL HERNIA REPAIR ;  Surgeon: Mariella Saa, MD;  Location: [facility] ORS;  Service: General;  Laterality: Left;    INSERTION OF MESH Left 07/22/2013   Procedure: INSERTION OF MESH;  Surgeon: Mariella Saa, MD;  Location: [facility] ORS;  Service: General;  Laterality: Left;   INTRAOPERATIVE TRANSESOPHAGEAL ECHOCARDIOGRAM  01/03/2013   Procedure: INTRAOPERATIVE TRANSESOPHAGEAL ECHOCARDIOGRAM;  Surgeon: Purcell Nails, MD;  Location: [facility] OR;  Service: Open Heart Surgery;  Laterality: N/A;   LEFT HEART CATHETERIZATION WITH CORONARY ANGIOGRAM N/A 01/01/2013   Procedure: LEFT HEART CATHETERIZATION WITH CORONARY ANGIOGRAM;  Surgeon: [name], MD;  Location[facility] CATH LAB;  Service: Cardiovascular;  Laterality: N/A;    Current Medications: No outpatient medications have been marked as taking for the 06/21/23 encounter (Appointment) with Marcelino Duster, PA.     Allergies:   Atorvastatin and Rosuvastatin   Social History   Socioeconomic History   Marital status: Married    Spouse name: Not on file   Number of children: Not on file   Years of education: Not on file   Highest education level: Not on file  Occupational History   Not on file  Tobacco Use   Smoking status: Every Day    Packs/day: 1.00    Years: 20.00    Additional pack years: 0.00    Total pack years: 20.00    Types: Cigarettes   Smokeless tobacco: Never  Substance and Sexual Activity  Alcohol use: Yes    Comment: beers "every now and then"   Drug use: No   Sexual activity: Not on file  Other Topics Concern   Not on file  Social History Narrative   Not on file   Social Determinants of Health   Financial Resource Strain: Not on file  Food Insecurity: Not on file  Transportation Needs: Not on file  Physical Activity: Not on file  Stress: Not on file  Social Connections: Not on file     Family History: The patient's ***family history includes Cancer in his brother and father; Hypertension in his mother.  ROS:   Please see the history of present illness.    *** All other systems reviewed and are  negative.  EKGs/Labs/Other Studies Reviewed:    The following studies were reviewed today: ***      Recent Labs: No results found for requested labs within last 365 days.  Recent Lipid Panel    Component Value Date/Time   CHOL 130 03/29/2022 0948   TRIG 182 (H) 03/29/2022 0948   HDL 40 03/29/2022 0948   CHOLHDL 3.3 03/29/2022 0948   CHOLHDL 4.8 12/16/2016 0924   VLDL 35 (H) 12/16/2016 0924   LDLCALC 60 03/29/2022 0948     Risk Assessment/Calculations:   {Does this patient have ATRIAL FIBRILLATION?:[id number]}  No BP recorded.  {Refresh Note OR Click here to enter BP  :1}***         Physical Exam:    VS:  There were no vitals taken for this visit.    Wt Readings from Last 3 Encounters:  01/21/22 208 lb (94.3 kg)  12/16/20 211 lb (95.7 kg)  10/19/20 213 lb (96.6 kg)     GEN: *** Well nourished, well developed in no acute distress HEENT: Normal NECK: No JVD; No carotid bruits LYMPHATICS: No lymphadenopathy CARDIAC: ***RRR, no murmurs, rubs, gallops RESPIRATORY:  Clear to auscultation without rales, wheezing or rhonchi  ABDOMEN: Soft, non-tender, non-distended MUSCULOSKELETAL:  No edema; No deformity  SKIN: Warm and dry NEUROLOGIC:  Alert and oriented x 3 PSYCHIATRIC:  Normal affect   ASSESSMENT:    No diagnosis found. PLAN:    In order of problems listed above:  ***      {Are you ordering a CV Procedure (e.g. stress test, cath, DCCV, TEE, etc)?   Press F2        :[id number]}    Medication Adjustments/Labs and Tests Ordered: Current medicines are reviewed at length with the patient today.  Concerns regarding medicines are outlined above.  No orders of the defined types were placed in this encounter.  No orders of the defined types were placed in this encounter.   There are no Patient Instructions on file for this visit.   Signed, Marcelino Duster, [place]  06/15/2023 2:36 PM    [facility]

## 2023-06-21 ENCOUNTER — Ambulatory Visit: Payer: Medicare PPO | Admitting: Physician Assistant

## 2023-07-27 NOTE — Progress Notes (Deleted)
Cardiology Office Note:    Date:  07/27/2023   ID:  Juan Gates, DOB Jun 11, 1956, MRN [id number]  PCP:  Fleet Contras, MD   [facility] Providers Cardiologist:  Nicki Guadalajara, MD { Click to update primary MD,subspecialty MD or APP then REFRESH:1}    Referring MD: Fleet Contras, MD   No chief complaint on file. ***  History of Present Illness:    Juan Gates is a 67 y.o. male with a hx of CAD s/p CABG x 2 (LIMA-LAD, RIMA-OM 2014, following NSTEMI), HLD, HTN, and history of tobacco use. HLD managed with statin, fenofibrate, and omega-3 fatty acid. He has since started repatha.  I saw him in 2020. Last seen by Edd Fabian NP 12/2021.  He presents for overdue annual follow up. Unfortunately he continues to smoke cigarettes.     CAD s/p CABG x 2 (2014) No recent surveillance stress test   Hyperlipidemia with LDL goal < 55 On repatha, zetia, fenofibrate, lovaza Needs updated lipid panel   Hypertension 5 mg lisinopril, 75 mg lopressor BID   Ongoing tobacco use - encouraged cessation         Past Medical History:  Diagnosis Date   Anxiety    Arthritis    knees, right hip   Complication of anesthesia    "started fighting when woke up with tube down throat"   Coronary artery disease    Hyperlipidemia    Hypertension    Myocardial infarction [facility]) Jan. 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   LIMA to LAD, RIMA to OM1    Past Surgical History:  Procedure Laterality Date   CARDIAC CATHETERIZATION  12/31/2012   2 vessel cors 90%hazy stenosis prox circ with long segment 60-70%LAD,mod RCA,severe lesion small marginal branch   CORONARY ARTERY BYPASS GRAFT  01/03/2013   Procedure: CORONARY ARTERY BYPASS GRAFTING (CABG);  Surgeon: Purcell Nails, MD;  Location: [facility] OR;  Service: Open Heart Surgery;  Laterality: N/A;   HERNIA REPAIR  07/22/13   open RIH   INGUINAL HERNIA REPAIR Left 07/22/2013   Procedure: OPEN LEFT INGUINAL HERNIA REPAIR ;  Surgeon:  Mariella Saa, MD;  Location: [facility] ORS;  Service: General;  Laterality: Left;   INSERTION OF MESH Left 07/22/2013   Procedure: INSERTION OF MESH;  Surgeon: Mariella Saa, MD;  Location: [facility] ORS;  Service: General;  Laterality: Left;   INTRAOPERATIVE TRANSESOPHAGEAL ECHOCARDIOGRAM  01/03/2013   Procedure: INTRAOPERATIVE TRANSESOPHAGEAL ECHOCARDIOGRAM;  Surgeon: Purcell Nails, MD;  Location: [facility] OR;  Service: Open Heart Surgery;  Laterality: N/A;   LEFT HEART CATHETERIZATION WITH CORONARY ANGIOGRAM N/A 01/01/2013   Procedure: LEFT HEART CATHETERIZATION WITH CORONARY ANGIOGRAM;  Surgeon: [name], MD;  Location[facility] CATH LAB;  Service: Cardiovascular;  Laterality: N/A;    Current Medications: No outpatient medications have been marked as taking for the 08/03/23 encounter (Appointment) with Marcelino Duster, PA.     Allergies:   Atorvastatin and Rosuvastatin   Social History   Socioeconomic History   Marital status: Married    Spouse name: Not on file   Number of children: Not on file   Years of education: Not on file   Highest education level: Not on file  Occupational History   Not on file  Tobacco Use   Smoking status: Every Day    Current packs/day: 1.00    Average packs/day: 1 pack/day for 20.0 years (20.0 ttl pk-yrs)    Types: Cigarettes   Smokeless  tobacco: Never  Substance and Sexual Activity   Alcohol use: Yes    Comment: beers "every now and then"   Drug use: No   Sexual activity: Not on file  Other Topics Concern   Not on file  Social History Narrative   Not on file   Social Determinants of Health   Financial Resource Strain: Not on file  Food Insecurity: Not on file  Transportation Needs: Not on file  Physical Activity: Not on file  Stress: Not on file  Social Connections: Not on file     Family History: The patient's ***family history includes Cancer in his brother and father; Hypertension in his mother.  ROS:   Please see the history of  present illness.    *** All other systems reviewed and are negative.  EKGs/Labs/Other Studies Reviewed:    The following studies were reviewed today: ***      Recent Labs: No results found for requested labs within last 365 days.  Recent Lipid Panel    Component Value Date/Time   CHOL 130 03/29/2022 0948   TRIG 182 (H) 03/29/2022 0948   HDL 40 03/29/2022 0948   CHOLHDL 3.3 03/29/2022 0948   CHOLHDL 4.8 12/16/2016 0924   VLDL 35 (H) 12/16/2016 0924   LDLCALC 60 03/29/2022 0948     Risk Assessment/Calculations:   {Does this patient have ATRIAL FIBRILLATION?:[phone]}  No BP recorded.  {Refresh Note OR Click here to enter BP  :1}***         Physical Exam:    VS:  There were no vitals taken for this visit.    Wt Readings from Last 3 Encounters:  01/21/22 208 lb (94.3 kg)  12/16/20 211 lb (95.7 kg)  10/19/20 213 lb (96.6 kg)     GEN: *** Well nourished, well developed in no acute distress HEENT: Normal NECK: No JVD; No carotid bruits LYMPHATICS: No lymphadenopathy CARDIAC: ***RRR, no murmurs, rubs, gallops RESPIRATORY:  Clear to auscultation without rales, wheezing or rhonchi  ABDOMEN: Soft, non-tender, non-distended MUSCULOSKELETAL:  No edema; No deformity  SKIN: Warm and dry NEUROLOGIC:  Alert and oriented x 3 PSYCHIATRIC:  Normal affect   ASSESSMENT:    No diagnosis found. PLAN:    In order of problems listed above:  ***      {Are you ordering a CV Procedure (e.g. stress test, cath, DCCV, TEE, etc)?   Press F2        :[id number]}    Medication Adjustments/Labs and Tests Ordered: Current medicines are reviewed at length with the patient today.  Concerns regarding medicines are outlined above.  No orders of the defined types were placed in this encounter.  No orders of the defined types were placed in this encounter.   There are no Patient Instructions on file for this visit.   Signed, Marcelino Duster, PA  07/27/2023 10:10 AM    [facility]

## 2023-08-03 ENCOUNTER — Ambulatory Visit: Payer: Medicare PPO | Admitting: Physician Assistant

## 2023-09-08 ENCOUNTER — Ambulatory Visit: Payer: Medicare PPO | Attending: General Practice | Admitting: Physician Assistant

## 2023-09-08 ENCOUNTER — Encounter: Payer: Self-pay | Admitting: Physician Assistant

## 2023-09-08 VITALS — BP 130/74 | HR 69 | Ht 73.5 in | Wt 214.0 lb

## 2023-09-08 DIAGNOSIS — I25810 Atherosclerosis of coronary artery bypass graft(s) without angina pectoris: Secondary | ICD-10-CM | POA: Diagnosis not present

## 2023-09-08 DIAGNOSIS — I10 Essential (primary) hypertension: Secondary | ICD-10-CM | POA: Diagnosis not present

## 2023-09-08 DIAGNOSIS — E785 Hyperlipidemia, unspecified: Secondary | ICD-10-CM | POA: Diagnosis not present

## 2023-09-08 NOTE — Progress Notes (Unsigned)
Cardiology Office Note:  .   Date:  09/10/2023  ID:  Juan Gates, DOB 12/28/1955, MRN [id number] PCP: Juan Reel, PA-C  [facility] Providers Cardiologist:  Juan Guadalajara, MD     History of Present Illness: .   Juan Gates is a 67 y.o. male with PMH of CAD s/p CABG x 2 (LIMA-LAD, RIMA-OM) on 01/03/2013, tobacco abuse, HTN and HLD.  He had a NSTEMI in January 2014 and underwent CABG x2 by Dr. Cornelius Gates with RIMA to OM, LIMA to LAD.  EF was 55-60% by echo at the time. He has been on disability since CABG in January 2014.  Due to the uncontrolled cholesterol, fenofibrate was added to Lipitor and omega-3 fatty acid.  He has been placed on Repatha for cholesterol control.  He was last seen by Juan Fabian, NP in January 2023 at which time he was doing well without any exertional symptoms.  Patient presents today for follow-up.  He recently established with a new primary care provider.  He has not had any recent exertional chest pain or shortness of breath.  He has no lower extremity edema, thumping or PND.  He can follow-up in 1 year.  He plans to obtain blood work at his PCPs office.  I asked him to forward [place] a copy as well.  ROS:   He denies chest pain, palpitations, dyspnea, pnd, orthopnea, n, v, dizziness, syncope, edema, weight gain, or early satiety. All other systems reviewed and are otherwise negative except as noted above.    Studies Reviewed: Marland Kitchen   EKG Interpretation Date/Time:  Friday September 08 2023 13:39:14 EDT Ventricular Rate:  64 PR Interval:  144 QRS Duration:  90 QT Interval:  402 QTC Calculation: 414 R Axis:   -16  Text Interpretation: Normal sinus rhythm Inferior infarct , age undetermined Possible Anterior infarct , age undetermined Confirmed by Juan Gates [phone]) on 09/10/2023 8:34:58 PM        Risk Assessment/Calculations:            Physical Exam:   VS:  BP 130/74   Pulse 69   Ht 6' 1.5" (1.867 m)   Wt 214 lb (97.1 kg)   SpO2 99%   BMI 27.85  kg/m    Wt Readings from Last 3 Encounters:  09/08/23 214 lb (97.1 kg)  01/21/22 208 lb (94.3 kg)  12/16/20 211 lb (95.7 kg)    GEN: Well nourished, well developed in no acute distress NECK: No JVD; No carotid bruits CARDIAC: RRR, no murmurs, rubs, gallops RESPIRATORY:  Clear to auscultation without rales, wheezing or rhonchi  ABDOMEN: Soft, non-tender, non-distended EXTREMITIES:  No edema; No deformity   ASSESSMENT AND PLAN: .    CAD s/p CABG: Denies any recent chest pain.  Continue on aspirin, Repatha, Zetia and metoprolol.  He is no longer on fenofibrate.  Hypertension: Blood pressure well-controlled  Hyperlipidemia: On Zetia and Repatha.  Patient preferred to obtain lipid panel at PCPs office.  I asked him to request his PCP's office to forward [place] a copy of his lab work as well.       Dispo: Follow-up in 1 year.  Signed, Juan Course, PA

## 2023-09-08 NOTE — Patient Instructions (Signed)
Medication Instructions:  NO CHANGES *If you need a refill on your cardiac medications before your next appointment, please call your pharmacy*   Lab Work: NO LABS If you have labs (blood work) drawn today and your tests are completely normal, you will receive your results only by: MyChart Message (if you have MyChart) OR A paper copy in the mail If you have any lab test that is abnormal or we need to change your treatment, we will call you to review the results.   Testing/Procedures: NO TESTING   Follow-Up: At [facility], you and your health needs are our priority.  As part of our continuing mission to provide you with exceptional heart care, we have created designated Provider Care Teams.  These Care Teams include your primary Cardiologist (physician) and Advanced Practice Providers (APPs -  Physician Assistants and Nurse Practitioners) who all work together to provide you with the care you need, when you need it.    Your next appointment:   1 year(s)  Provider:   Nicki Guadalajara, MD
